# Patient Record
Sex: Male | Born: 1955 | Race: White | Hispanic: No | Marital: Married | Smoking: Former smoker
Health system: Southern US, Community
[De-identification: ages and names within clinical notes are randomized; demographics above are authoritative.]

## PROBLEM LIST (undated history)

## (undated) ENCOUNTER — Encounter

## (undated) ENCOUNTER — Telehealth

## (undated) ENCOUNTER — Ambulatory Visit: Payer: MEDICARE

## (undated) ENCOUNTER — Encounter
Attending: Student in an Organized Health Care Education/Training Program | Primary: Student in an Organized Health Care Education/Training Program

## (undated) ENCOUNTER — Telehealth: Attending: Internal Medicine | Primary: Internal Medicine

## (undated) ENCOUNTER — Emergency Department: Payer: MEDICARE

## (undated) ENCOUNTER — Ambulatory Visit

## (undated) DIAGNOSIS — R202 Paresthesia of skin: Secondary | ICD-10-CM

## (undated) DIAGNOSIS — I10 Essential (primary) hypertension: Secondary | ICD-10-CM

## (undated) DIAGNOSIS — M51369 Other intervertebral disc degeneration, lumbar region without mention of lumbar back pain or lower extremity pain: Secondary | ICD-10-CM

## (undated) DIAGNOSIS — S2239XA Fracture of one rib, unspecified side, initial encounter for closed fracture: Secondary | ICD-10-CM

## (undated) DIAGNOSIS — M1990 Unspecified osteoarthritis, unspecified site: Secondary | ICD-10-CM

## (undated) DIAGNOSIS — K219 Gastro-esophageal reflux disease without esophagitis: Secondary | ICD-10-CM

## (undated) DIAGNOSIS — R200 Anesthesia of skin: Secondary | ICD-10-CM

## (undated) DIAGNOSIS — K5090 Crohn's disease, unspecified, without complications: Secondary | ICD-10-CM

## (undated) DIAGNOSIS — J349 Unspecified disorder of nose and nasal sinuses: Secondary | ICD-10-CM

## (undated) DIAGNOSIS — I639 Cerebral infarction, unspecified: Secondary | ICD-10-CM

## (undated) DIAGNOSIS — E039 Hypothyroidism, unspecified: Secondary | ICD-10-CM

## (undated) DIAGNOSIS — M5136 Other intervertebral disc degeneration, lumbar region: Secondary | ICD-10-CM

## (undated) DIAGNOSIS — S42009A Fracture of unspecified part of unspecified clavicle, initial encounter for closed fracture: Secondary | ICD-10-CM

## (undated) HISTORY — PX: NASAL SINUS SURGERY: SHX719

## (undated) HISTORY — PX: COLONOSCOPY W/ BIOPSIES: SHX1374

## (undated) HISTORY — PX: EYE SURGERY: SHX253

## (undated) HISTORY — DX: Cerebral infarction, unspecified: I63.9

## (undated) MED ORDER — BUDESONIDE DR - ER 3 MG CAPSULE,DELAYED,EXTENDED RELEASE: Freq: Every morning | ORAL | 0 days

## (undated) MED ORDER — OMEGA 3-DHA-EPA-FISH OIL 300 MG-1,000 MG CAPSULE: Freq: Every day | ORAL | 0 days

## (undated) MED ORDER — REMICADE IV: 0.00000 days

## (undated) MED ORDER — ASPIRIN 325 MG TABLET: Freq: Every day | ORAL | 0.00000 days

## (undated) MED ORDER — AMOXICILLIN 875 MG-POTASSIUM CLAVULANATE 125 MG TABLET: Freq: Two times a day (BID) | 0 days

## (undated) MED ORDER — AZITHROMYCIN 250 MG TABLET: 0.00000 days

---

## 2003-07-01 DIAGNOSIS — S2249XA Multiple fractures of ribs, unspecified side, initial encounter for closed fracture: Secondary | ICD-10-CM

## 2003-07-01 HISTORY — DX: Multiple fractures of ribs, unspecified side, initial encounter for closed fracture: S22.49XA

## 2008-06-30 HISTORY — PX: KNEE ARTHROSCOPY: SUR90

## 2014-04-25 ENCOUNTER — Other Ambulatory Visit: Payer: Self-pay | Admitting: Neurosurgery

## 2014-04-28 ENCOUNTER — Encounter (HOSPITAL_COMMUNITY): Payer: Self-pay | Admitting: Pharmacy Technician

## 2014-05-04 NOTE — Pre-Procedure Instructions (Signed)
Liane ComberRobert Golebiewski  05/04/2014   Your procedure is scheduled on:  Friday, November 13th  [facility] Admitting at [phone].  Call this number if you have problems the morning of surgery: [phone]   Remember:   Do not eat food or drink liquids after midnight.   Take these medicines the morning of surgery with A SIP OF WATER: nexium, hydrocodone if needed   Do not wear jewelry.  Do not wear lotions, powders, or perfume, deodorant.  Do not shave 48 hours prior to surgery. Men may shave face and neck.  Do not bring valuables to the hospital.  [facility] is not responsible for any belongings or valuables.               Contacts, dentures or bridgework may not be worn into surgery.  Leave suitcase in the car. After surgery it may be brought to your room.  For patients admitted to the hospital, discharge time is determined by your  treatment team.               Patients discharged the day of surgery will not be allowed to drive home.  Please read over the following fact sheets that you were given: Pain Booklet, Coughing and Deep Breathing, MRSA Information and Surgical Site Infection Prevention  [place] - Preparing for Surgery  Before surgery, you can play an important role.  Because skin is not sterile, your skin needs to be as free of germs as possible.  You can reduce the number of germs on you skin by washing with CHG (chlorahexidine gluconate) soap before surgery.  CHG is an antiseptic cleaner which kills germs and bonds with the skin to continue killing germs even after washing.  Please DO NOT use if you have an allergy to CHG or antibacterial soaps.  If your skin becomes reddened/irritated stop using the CHG and inform your nurse when you arrive at Short Stay.  Do not shave (including legs and underarms) for at least 48 hours prior to the first CHG shower.  You may shave your face.  Please follow these instructions carefully:   1.  Shower with CHG Soap the night  before surgery and the morning of Surgery.  2.  If you choose to wash your hair, wash your hair first as usual with your normal shampoo.  3.  After you shampoo, rinse your hair and body thoroughly to remove the shampoo.  4.  Use CHG as you would any other liquid soap.  You can apply CHG directly to the skin and wash gently with scrungie or a clean washcloth.  5.  Apply the CHG Soap to your body ONLY FROM THE NECK DOWN.  Do not use on open wounds or open sores.  Avoid contact with your eyes, ears, mouth and genitals (private parts).  Wash genitals (private parts) with your normal soap.  6.  Wash thoroughly, paying special attention to the area where your surgery will be performed.  7.  Thoroughly rinse your body with warm water from the neck down.  8.  DO NOT shower/wash with your normal soap after using and rinsing off the CHG Soap.  9.  Pat yourself dry with a clean towel.            10.  Wear clean pajamas.            11.  Place clean sheets on your bed the night of your first shower and do not sleep  with pets.  Day of Surgery  Do not apply any lotions/deoderants the morning of surgery.  Please wear clean clothes to the hospital/surgery center.

## 2014-05-05 ENCOUNTER — Encounter (HOSPITAL_COMMUNITY)
Admission: RE | Admit: 2014-05-05 | Discharge: 2014-05-05 | Disposition: A | Discharge status: Home / Self Care | Payer: BC Managed Care – PPO | Source: Ambulatory Visit | Attending: Neurosurgery | Admitting: Neurosurgery

## 2014-05-05 ENCOUNTER — Ambulatory Visit (HOSPITAL_COMMUNITY)
Admission: RE | Admit: 2014-05-05 | Discharge: 2014-05-05 | Disposition: A | Discharge status: Home / Self Care | Payer: BC Managed Care – PPO | Source: Ambulatory Visit | Attending: Anesthesiology | Admitting: Anesthesiology

## 2014-05-05 ENCOUNTER — Encounter (HOSPITAL_COMMUNITY): Payer: Self-pay

## 2014-05-05 DIAGNOSIS — Z01811 Encounter for preprocedural respiratory examination: Secondary | ICD-10-CM | POA: Diagnosis not present

## 2014-05-05 DIAGNOSIS — R0602 Shortness of breath: Secondary | ICD-10-CM | POA: Diagnosis present

## 2014-05-05 HISTORY — DX: Unspecified disorder of nose and nasal sinuses: J34.9

## 2014-05-05 HISTORY — DX: Essential (primary) hypertension: I10

## 2014-05-05 HISTORY — DX: Other intervertebral disc degeneration, lumbar region without mention of lumbar back pain or lower extremity pain: M51.369

## 2014-05-05 HISTORY — DX: Other intervertebral disc degeneration, lumbar region: M51.36

## 2014-05-05 HISTORY — DX: Gastro-esophageal reflux disease without esophagitis: K21.9

## 2014-05-05 HISTORY — DX: Crohn's disease, unspecified, without complications: K50.90

## 2014-05-05 HISTORY — DX: Fracture of one rib, unspecified side, initial encounter for closed fracture: S22.39XA

## 2014-05-05 HISTORY — DX: Fracture of unspecified part of unspecified clavicle, initial encounter for closed fracture: S42.009A

## 2014-05-05 HISTORY — DX: Hypothyroidism, unspecified: E03.9

## 2014-05-05 HISTORY — DX: Anesthesia of skin: R20.0

## 2014-05-05 HISTORY — DX: Paresthesia of skin: R20.2

## 2014-05-05 HISTORY — DX: Unspecified osteoarthritis, unspecified site: M19.90

## 2014-05-05 LAB — BASIC METABOLIC PANEL
Anion gap: 16 — ABNORMAL HIGH (ref 5–15)
BUN: 14 mg/dL (ref 6–23)
CHLORIDE: 104 meq/L (ref 96–112)
CO2: 22 meq/L (ref 19–32)
CREATININE: 0.73 mg/dL (ref 0.50–1.35)
Calcium: 9.4 mg/dL (ref 8.4–10.5)
GFR calc Af Amer: >90 mL/min (ref >90)
GFR calc non Af Amer: >90 mL/min (ref >90)
Glucose, Bld: 104 mg/dL — ABNORMAL HIGH (ref 70–99)
POTASSIUM: 3.5 meq/L — AB (ref 3.7–5.3)
Sodium: 142 mEq/L (ref 137–147)

## 2014-05-05 LAB — CBC
HEMATOCRIT: 40 % (ref 39.0–52.0)
HEMOGLOBIN: 13.6 g/dL (ref 13.0–17.0)
MCH: 34.4 pg — AB (ref 26.0–34.0)
MCHC: 34 g/dL (ref 30.0–36.0)
MCV: 101.3 fL — ABNORMAL HIGH (ref 78.0–100.0)
Platelets: 288 10*3/uL (ref 150–400)
RBC: 3.95 MIL/uL — AB (ref 4.22–5.81)
RDW: 13.8 % (ref 11.5–15.5)
WBC: 10.4 10*3/uL (ref 4.0–10.5)

## 2014-05-05 LAB — SURGICAL PCR SCREEN
MRSA, PCR: NEGATIVE
Staphylococcus aureus: POSITIVE — AB

## 2014-05-05 NOTE — Progress Notes (Signed)
Mr Sean Poole has had a cough this past week .  Patient saw his PCP, Dr Jonelle SportsPomposini and was started on antibiotics. Dr Jonelle SportsPomposini did not do order a chest xray.  Chest Xray done today.

## 2014-05-05 NOTE — Progress Notes (Signed)
   05/05/14 1041  OBSTRUCTIVE SLEEP APNEA  Have you ever been diagnosed with sleep apnea through a sleep study? No  Do you snore loudly (loud enough to be heard through closed doors)?  1  Do you often feel tired, fatigued, or sleepy during the daytime? 0  Has anyone observed you stop breathing during your sleep? 0  Do you have, or are you being treated for high blood pressure? 1  BMI more than 35 kg/m2? 0  Age over 10345 years old? 1  Neck circumference greater than 40 cm/16 inches? 1 (17)  Gender: 1  Obstructive Sleep Apnea Score 5  Score 4 or greater  Results sent to PCP

## 2014-05-05 NOTE — Progress Notes (Signed)
I called a prescription to [facility] for Mupirocin Ointment, 22 gram tube, apply to each nosteril twice a day times 5 days.

## 2014-05-11 MED ORDER — CEFAZOLIN SODIUM-DEXTROSE 2-3 GM-% IV SOLR
2.0000 g | INTRAVENOUS | Status: AC
  Administered 2014-05-12: 2 g via INTRAVENOUS
  Filled 2014-05-11: qty 50

## 2014-05-12 ENCOUNTER — Encounter (HOSPITAL_COMMUNITY)
Admission: RE | Disposition: A | Discharge status: Home / Self Care | Payer: Self-pay | Source: Ambulatory Visit | Attending: Neurosurgery

## 2014-05-12 ENCOUNTER — Ambulatory Visit (HOSPITAL_COMMUNITY): Payer: BC Managed Care – PPO | Admitting: Anesthesiology

## 2014-05-12 ENCOUNTER — Observation Stay (HOSPITAL_COMMUNITY)
Admission: RE | Admit: 2014-05-12 | Discharge: 2014-05-13 | Disposition: A | Discharge status: Home / Self Care | Payer: BC Managed Care – PPO | Source: Ambulatory Visit | Attending: Neurosurgery | Admitting: Neurosurgery

## 2014-05-12 ENCOUNTER — Encounter (HOSPITAL_COMMUNITY): Payer: Self-pay | Admitting: *Deleted

## 2014-05-12 ENCOUNTER — Ambulatory Visit (HOSPITAL_COMMUNITY): Payer: BC Managed Care – PPO

## 2014-05-12 DIAGNOSIS — Z79899 Other long term (current) drug therapy: Secondary | ICD-10-CM | POA: Diagnosis not present

## 2014-05-12 DIAGNOSIS — M5020 Other cervical disc displacement, unspecified cervical region: Secondary | ICD-10-CM | POA: Diagnosis present

## 2014-05-12 DIAGNOSIS — M5012 Cervical disc disorder with radiculopathy, mid-cervical region: Principal | ICD-10-CM | POA: Insufficient documentation

## 2014-05-12 HISTORY — PX: ANTERIOR CERVICAL DECOMP/DISCECTOMY FUSION: SHX1161

## 2014-05-12 SURGERY — ANTERIOR CERVICAL DECOMPRESSION/DISCECTOMY FUSION 1 LEVEL
Anesthesia: General

## 2014-05-12 MED ORDER — MORPHINE SULFATE 2 MG/ML IJ SOLN
1.0000 mg | INTRAMUSCULAR | Status: DC | PRN
  Administered 2014-05-12: 4 mg via INTRAVENOUS
  Filled 2014-05-12: qty 2

## 2014-05-12 MED ORDER — GLYCOPYRROLATE 0.2 MG/ML IJ SOLN
INTRAMUSCULAR | Status: DC | PRN
  Administered 2014-05-12: 0.6 mg via INTRAVENOUS

## 2014-05-12 MED ORDER — CEFUROXIME AXETIL 500 MG PO TABS: 500.0000 mg | ORAL_TABLET | Freq: Two times a day (BID) | ORAL | Status: DC

## 2014-05-12 MED ORDER — IRBESARTAN 75 MG PO TABS
75.0000 mg | ORAL_TABLET | Freq: Every day | ORAL | Status: DC
  Filled 2014-05-12 (×2): qty 1

## 2014-05-12 MED ORDER — NEOSTIGMINE METHYLSULFATE 10 MG/10ML IV SOLN
INTRAVENOUS | Status: DC | PRN
  Administered 2014-05-12: 5 mg via INTRAVENOUS

## 2014-05-12 MED ORDER — OXYCODONE-ACETAMINOPHEN 5-325 MG PO TABS
ORAL_TABLET | ORAL | Status: AC
  Administered 2014-05-13: 2
  Filled 2014-05-12: qty 2

## 2014-05-12 MED ORDER — MIDAZOLAM HCL 5 MG/5ML IJ SOLN
INTRAMUSCULAR | Status: DC | PRN
  Administered 2014-05-12: 2 mg via INTRAVENOUS

## 2014-05-12 MED ORDER — DIAZEPAM 5 MG PO TABS
ORAL_TABLET | ORAL | Status: AC
Start: 2014-05-12 — End: 2014-05-13
  Filled 2014-05-12: qty 1

## 2014-05-12 MED ORDER — SODIUM CHLORIDE 0.9 % IJ SOLN: 3.0000 mL | INTRAMUSCULAR | Status: DC | PRN

## 2014-05-12 MED ORDER — GLYCOPYRROLATE 0.2 MG/ML IJ SOLN
INTRAMUSCULAR | Status: AC
  Filled 2014-05-12: qty 3

## 2014-05-12 MED ORDER — DOCUSATE SODIUM 100 MG PO CAPS
100.0000 mg | ORAL_CAPSULE | Freq: Two times a day (BID) | ORAL | Status: DC
  Administered 2014-05-12: 100 mg via ORAL
  Filled 2014-05-12 (×3): qty 1

## 2014-05-12 MED ORDER — PHENOL 1.4 % MT LIQD: 1.0000 | OROMUCOSAL | Status: DC | PRN

## 2014-05-12 MED ORDER — ACETAMINOPHEN 650 MG RE SUPP
650.0000 mg | RECTAL | Status: DC | PRN
Start: 2014-05-12 — End: 2014-05-13

## 2014-05-12 MED ORDER — POLYETHYLENE GLYCOL 3350 17 G PO PACK
17.0000 g | PACK | Freq: Every day | ORAL | Status: DC | PRN
  Filled 2014-05-12: qty 1

## 2014-05-12 MED ORDER — LIDOCAINE HCL (CARDIAC) 20 MG/ML IV SOLN
INTRAVENOUS | Status: DC | PRN
  Administered 2014-05-12: 100 mg via INTRAVENOUS

## 2014-05-12 MED ORDER — THROMBIN 5000 UNITS EX SOLR
CUTANEOUS | Status: DC | PRN
  Administered 2014-05-12 (×2): 5000 [IU] via TOPICAL

## 2014-05-12 MED ORDER — NEOSTIGMINE METHYLSULFATE 10 MG/10ML IV SOLN
INTRAVENOUS | Status: AC
  Filled 2014-05-12: qty 1

## 2014-05-12 MED ORDER — SENNA 8.6 MG PO TABS
1.0000 | ORAL_TABLET | Freq: Two times a day (BID) | ORAL | Status: DC
  Administered 2014-05-12: 8.6 mg via ORAL
  Filled 2014-05-12 (×3): qty 1

## 2014-05-12 MED ORDER — PROPOFOL 10 MG/ML IV BOLUS
INTRAVENOUS | Status: AC
  Filled 2014-05-12: qty 20

## 2014-05-12 MED ORDER — HEMOSTATIC AGENTS (NO CHARGE) OPTIME
TOPICAL | Status: DC | PRN
  Administered 2014-05-12: 1 via TOPICAL

## 2014-05-12 MED ORDER — NIACIN ER (ANTIHYPERLIPIDEMIC) 1000 MG PO TBCR
1000.0000 mg | EXTENDED_RELEASE_TABLET | Freq: Every day | ORAL | Status: DC
  Filled 2014-05-12 (×2): qty 1

## 2014-05-12 MED ORDER — ONDANSETRON HCL 4 MG/2ML IJ SOLN: 4.0000 mg | INTRAMUSCULAR | Status: DC | PRN

## 2014-05-12 MED ORDER — PROPOFOL 10 MG/ML IV BOLUS
INTRAVENOUS | Status: DC | PRN
Start: 2014-05-12 — End: 2014-05-12
  Administered 2014-05-12: 180 mg via INTRAVENOUS

## 2014-05-12 MED ORDER — MENTHOL 3 MG MT LOZG
1.0000 | LOZENGE | OROMUCOSAL | Status: DC | PRN
  Filled 2014-05-12: qty 9

## 2014-05-12 MED ORDER — ROCURONIUM BROMIDE 50 MG/5ML IV SOLN
INTRAVENOUS | Status: AC
  Filled 2014-05-12: qty 1

## 2014-05-12 MED ORDER — NIACIN ER 500 MG PO CPCR
1000.0000 mg | ORAL_CAPSULE | Freq: Every day | ORAL | Status: DC
  Filled 2014-05-12: qty 2

## 2014-05-12 MED ORDER — 0.9 % SODIUM CHLORIDE (POUR BTL) OPTIME
TOPICAL | Status: DC | PRN
  Administered 2014-05-12: 1000 mL

## 2014-05-12 MED ORDER — BUDESONIDE 3 MG PO CP24
6.0000 mg | ORAL_CAPSULE | Freq: Every day | ORAL | Status: DC
  Filled 2014-05-12 (×2): qty 2

## 2014-05-12 MED ORDER — OXYCODONE HCL 5 MG/5ML PO SOLN: 5.0000 mg | Freq: Once | ORAL | Status: DC | PRN

## 2014-05-12 MED ORDER — NEOSTIGMINE METHYLSULFATE 10 MG/10ML IV SOLN
INTRAVENOUS | Status: AC
Start: 2014-05-12 — End: 2014-05-12
  Filled 2014-05-12: qty 1

## 2014-05-12 MED ORDER — OXYCODONE HCL 5 MG PO TABS: 5.0000 mg | ORAL_TABLET | Freq: Once | ORAL | Status: DC | PRN

## 2014-05-12 MED ORDER — NIACIN ER (ANTIHYPERLIPIDEMIC) 1000 MG PO TBCR: 1000.0000 mg | EXTENDED_RELEASE_TABLET | Freq: Every day | ORAL | Status: DC

## 2014-05-12 MED ORDER — ONDANSETRON HCL 4 MG/2ML IJ SOLN
INTRAMUSCULAR | Status: AC
  Filled 2014-05-12: qty 2

## 2014-05-12 MED ORDER — IRBESARTAN 75 MG PO TABS: 75.0000 mg | ORAL_TABLET | Freq: Every day | ORAL | Status: DC

## 2014-05-12 MED ORDER — FENTANYL CITRATE 0.05 MG/ML IJ SOLN
INTRAMUSCULAR | Status: AC
Start: 2014-05-12 — End: 2014-05-12
  Filled 2014-05-12: qty 5

## 2014-05-12 MED ORDER — FOLIC ACID 1 MG PO TABS
1.0000 mg | ORAL_TABLET | Freq: Every day | ORAL | Status: DC
  Filled 2014-05-12 (×2): qty 1

## 2014-05-12 MED ORDER — ONDANSETRON HCL 4 MG/2ML IJ SOLN: 4.0000 mg | Freq: Once | INTRAMUSCULAR | Status: DC | PRN

## 2014-05-12 MED ORDER — HYDROCORTISONE NA SUCCINATE PF 100 MG IJ SOLR
INTRAMUSCULAR | Status: DC | PRN
  Administered 2014-05-12: 100 mg via INTRAVENOUS

## 2014-05-12 MED ORDER — ZOLPIDEM TARTRATE 5 MG PO TABS
5.0000 mg | ORAL_TABLET | Freq: Every evening | ORAL | Status: DC | PRN
  Administered 2014-05-12: 5 mg via ORAL
  Filled 2014-05-12: qty 1

## 2014-05-12 MED ORDER — OXYCODONE-ACETAMINOPHEN 5-325 MG PO TABS
1.0000 | ORAL_TABLET | ORAL | Status: DC | PRN
  Administered 2014-05-12 – 2014-05-13 (×3): 2 via ORAL
  Filled 2014-05-12 (×3): qty 2

## 2014-05-12 MED ORDER — SODIUM CHLORIDE 0.9 % IJ SOLN
3.0000 mL | Freq: Two times a day (BID) | INTRAMUSCULAR | Status: DC
  Administered 2014-05-12: 3 mL via INTRAVENOUS

## 2014-05-12 MED ORDER — ALUM & MAG HYDROXIDE-SIMETH 200-200-20 MG/5ML PO SUSP
30.0000 mL | Freq: Four times a day (QID) | ORAL | Status: DC | PRN
  Filled 2014-05-12: qty 30

## 2014-05-12 MED ORDER — SIMVASTATIN 20 MG PO TABS
20.0000 mg | ORAL_TABLET | Freq: Every day | ORAL | Status: DC
  Administered 2014-05-12: 20 mg via ORAL
  Filled 2014-05-12 (×2): qty 1

## 2014-05-12 MED ORDER — BUPIVACAINE HCL (PF) 0.5 % IJ SOLN
INTRAMUSCULAR | Status: DC | PRN
  Administered 2014-05-12 (×2): 4 mL

## 2014-05-12 MED ORDER — PANTOPRAZOLE SODIUM 40 MG IV SOLR
40.0000 mg | Freq: Every day | INTRAVENOUS | Status: DC
  Filled 2014-05-12: qty 40

## 2014-05-12 MED ORDER — HYDROCODONE-ACETAMINOPHEN 7.5-325 MG PO TABS: 1.0000 | ORAL_TABLET | Freq: Four times a day (QID) | ORAL | Status: DC | PRN

## 2014-05-12 MED ORDER — ROCURONIUM BROMIDE 100 MG/10ML IV SOLN
INTRAVENOUS | Status: DC | PRN
  Administered 2014-05-12: 50 mg via INTRAVENOUS

## 2014-05-12 MED ORDER — FENTANYL CITRATE 0.05 MG/ML IJ SOLN
INTRAMUSCULAR | Status: DC | PRN
  Administered 2014-05-12 (×2): 100 ug via INTRAVENOUS
  Administered 2014-05-12 (×2): 50 ug via INTRAVENOUS

## 2014-05-12 MED ORDER — BISACODYL 10 MG RE SUPP: 10.0000 mg | Freq: Every day | RECTAL | Status: DC | PRN

## 2014-05-12 MED ORDER — CYCLOBENZAPRINE HCL 10 MG PO TABS
ORAL_TABLET | ORAL | Status: AC
  Filled 2014-05-12: qty 1

## 2014-05-12 MED ORDER — MIDAZOLAM HCL 2 MG/2ML IJ SOLN
INTRAMUSCULAR | Status: AC
  Filled 2014-05-12: qty 2

## 2014-05-12 MED ORDER — VECURONIUM BROMIDE 10 MG IV SOLR
INTRAVENOUS | Status: DC | PRN
  Administered 2014-05-12: 2 mg via INTRAVENOUS

## 2014-05-12 MED ORDER — PHENYLEPHRINE HCL 10 MG/ML IJ SOLN
10.0000 mg | INTRAVENOUS | Status: DC | PRN
  Administered 2014-05-12: 25 ug/min via INTRAVENOUS

## 2014-05-12 MED ORDER — FENTANYL CITRATE 0.05 MG/ML IJ SOLN
INTRAMUSCULAR | Status: AC
  Filled 2014-05-12: qty 5

## 2014-05-12 MED ORDER — METHOTREXATE 2.5 MG PO TABS: 25.0000 mg | ORAL_TABLET | ORAL | Status: DC

## 2014-05-12 MED ORDER — DIAZEPAM 5 MG PO TABS
5.0000 mg | ORAL_TABLET | Freq: Four times a day (QID) | ORAL | Status: DC | PRN
  Administered 2014-05-12 – 2014-05-13 (×2): 5 mg via ORAL
  Filled 2014-05-12: qty 1

## 2014-05-12 MED ORDER — NIACIN-SIMVASTATIN ER 1000-20 MG PO TB24: 1.0000 | ORAL_TABLET | Freq: Every day | ORAL | Status: DC

## 2014-05-12 MED ORDER — FLEET ENEMA 7-19 GM/118ML RE ENEM
1.0000 | ENEMA | Freq: Once | RECTAL | Status: AC | PRN
  Filled 2014-05-12: qty 1

## 2014-05-12 MED ORDER — CEFAZOLIN SODIUM 1-5 GM-% IV SOLN
1.0000 g | Freq: Three times a day (TID) | INTRAVENOUS | Status: AC
  Administered 2014-05-12 – 2014-05-13 (×2): 1 g via INTRAVENOUS
  Filled 2014-05-12 (×2): qty 50

## 2014-05-12 MED ORDER — OXYCODONE HCL 5 MG PO TABS
ORAL_TABLET | ORAL | Status: AC
  Administered 2014-05-12: 5 mg
  Filled 2014-05-12: qty 1

## 2014-05-12 MED ORDER — LACTATED RINGERS IV SOLN
INTRAVENOUS | Status: DC | PRN
Start: 2014-05-12 — End: 2014-05-12
  Administered 2014-05-12 (×2): via INTRAVENOUS

## 2014-05-12 MED ORDER — ONDANSETRON HCL 4 MG/2ML IJ SOLN
INTRAMUSCULAR | Status: DC | PRN
Start: 2014-05-12 — End: 2014-05-12
  Administered 2014-05-12: 4 mg via INTRAVENOUS

## 2014-05-12 MED ORDER — CYCLOBENZAPRINE HCL 10 MG PO TABS
10.0000 mg | ORAL_TABLET | Freq: Three times a day (TID) | ORAL | Status: DC | PRN
  Administered 2014-05-12: 10 mg via ORAL

## 2014-05-12 MED ORDER — LIDOCAINE HCL (CARDIAC) 20 MG/ML IV SOLN
INTRAVENOUS | Status: AC
Start: 2014-05-12 — End: 2014-05-12
  Filled 2014-05-12: qty 5

## 2014-05-12 MED ORDER — HYDROMORPHONE HCL 1 MG/ML IJ SOLN
0.2500 mg | INTRAMUSCULAR | Status: DC | PRN
  Administered 2014-05-12 (×4): 0.5 mg via INTRAVENOUS

## 2014-05-12 MED ORDER — HYDROMORPHONE HCL 1 MG/ML IJ SOLN
INTRAMUSCULAR | Status: AC
  Filled 2014-05-12: qty 1

## 2014-05-12 MED ORDER — KCL IN DEXTROSE-NACL 20-5-0.45 MEQ/L-%-% IV SOLN
INTRAVENOUS | Status: DC
  Filled 2014-05-12 (×2): qty 1000

## 2014-05-12 MED ORDER — PANTOPRAZOLE SODIUM 40 MG PO TBEC: 40.0000 mg | DELAYED_RELEASE_TABLET | Freq: Every day | ORAL | Status: DC

## 2014-05-12 MED ORDER — SODIUM CHLORIDE 0.9 % IV SOLN: 250.0000 mL | INTRAVENOUS | Status: DC

## 2014-05-12 MED ORDER — LACTATED RINGERS IV SOLN
INTRAVENOUS | Status: DC
  Administered 2014-05-12: 12:00:00 via INTRAVENOUS

## 2014-05-12 MED ORDER — HYDROCODONE-ACETAMINOPHEN 5-325 MG PO TABS: 1.0000 | ORAL_TABLET | ORAL | Status: DC | PRN

## 2014-05-12 MED ORDER — ACETAMINOPHEN 325 MG PO TABS: 650.0000 mg | ORAL_TABLET | ORAL | Status: DC | PRN

## 2014-05-12 SURGICAL SUPPLY — 69 items
ALLOGRAFT LORDOTIC 8X11X14 (Bone Implant) ×3 IMPLANT
BENZOIN TINCTURE PRP APPL 2/3 (GAUZE/BANDAGES/DRESSINGS) IMPLANT
BIT DRILL NEURO 2X3.1 SFT TUCH (MISCELLANEOUS) ×1 IMPLANT
BLADE ULTRA TIP 2M (BLADE) IMPLANT
BNDG GAUZE ELAST 4 BULKY (GAUZE/BANDAGES/DRESSINGS) IMPLANT
BUR BARREL STRAIGHT FLUTE 4.0 (BURR) ×3 IMPLANT
CANISTER SUCT 3000ML (MISCELLANEOUS) ×3 IMPLANT
CLOSURE WOUND 1/2 X4 (GAUZE/BANDAGES/DRESSINGS)
CONT SPEC 4OZ CLIKSEAL STRL BL (MISCELLANEOUS) ×3 IMPLANT
COVER MAYO STAND STRL (DRAPES) ×3 IMPLANT
DECANTER SPIKE VIAL GLASS SM (MISCELLANEOUS) ×3 IMPLANT
DERMABOND ADHESIVE PROPEN (GAUZE/BANDAGES/DRESSINGS) ×2
DERMABOND ADVANCED .7 DNX6 (GAUZE/BANDAGES/DRESSINGS) ×1 IMPLANT
DRAPE LAPAROTOMY 100X72 PEDS (DRAPES) ×3 IMPLANT
DRAPE MICROSCOPE LEICA (MISCELLANEOUS) ×3 IMPLANT
DRAPE POUCH INSTRU U-SHP 10X18 (DRAPES) ×3 IMPLANT
DRAPE PROXIMA HALF (DRAPES) IMPLANT
DRILL NEURO 2X3.1 SOFT TOUCH (MISCELLANEOUS) ×3
DRSG TELFA 3X8 NADH (GAUZE/BANDAGES/DRESSINGS) IMPLANT
DURAPREP 6ML APPLICATOR 50/CS (WOUND CARE) ×3 IMPLANT
ELECT COATED BLADE 2.86 ST (ELECTRODE) ×3 IMPLANT
ELECT REM PT RETURN 9FT ADLT (ELECTROSURGICAL) ×3
ELECTRODE REM PT RTRN 9FT ADLT (ELECTROSURGICAL) ×1 IMPLANT
GAUZE SPONGE 4X4 12PLY STRL (GAUZE/BANDAGES/DRESSINGS) IMPLANT
GAUZE SPONGE 4X4 16PLY XRAY LF (GAUZE/BANDAGES/DRESSINGS) IMPLANT
GLOVE BIO SURGEON STRL SZ8 (GLOVE) ×3 IMPLANT
GLOVE BIOGEL PI IND STRL 7.0 (GLOVE) ×1 IMPLANT
GLOVE BIOGEL PI IND STRL 8 (GLOVE) ×2 IMPLANT
GLOVE BIOGEL PI IND STRL 8.5 (GLOVE) ×1 IMPLANT
GLOVE BIOGEL PI INDICATOR 7.0 (GLOVE) ×2
GLOVE BIOGEL PI INDICATOR 8 (GLOVE) ×4
GLOVE BIOGEL PI INDICATOR 8.5 (GLOVE) ×2
GLOVE ECLIPSE 6.5 STRL STRAW (GLOVE) ×3 IMPLANT
GLOVE ECLIPSE 8.0 STRL XLNG CF (GLOVE) ×6 IMPLANT
GLOVE EXAM NITRILE LRG STRL (GLOVE) IMPLANT
GLOVE EXAM NITRILE MD LF STRL (GLOVE) IMPLANT
GLOVE EXAM NITRILE XL STR (GLOVE) IMPLANT
GLOVE EXAM NITRILE XS STR PU (GLOVE) IMPLANT
GLOVE SURG SS PI 7.0 STRL IVOR (GLOVE) ×6 IMPLANT
GOWN STRL REUS W/ TWL LRG LVL3 (GOWN DISPOSABLE) ×1 IMPLANT
GOWN STRL REUS W/ TWL XL LVL3 (GOWN DISPOSABLE) ×3 IMPLANT
GOWN STRL REUS W/TWL 2XL LVL3 (GOWN DISPOSABLE) ×6 IMPLANT
GOWN STRL REUS W/TWL LRG LVL3 (GOWN DISPOSABLE) ×2
GOWN STRL REUS W/TWL XL LVL3 (GOWN DISPOSABLE) ×6
HALTER HD/CHIN CERV TRACTION D (MISCELLANEOUS) ×3 IMPLANT
KIT BASIN OR (CUSTOM PROCEDURE TRAY) ×3 IMPLANT
KIT ROOM TURNOVER OR (KITS) ×3 IMPLANT
LIQUID BAND (GAUZE/BANDAGES/DRESSINGS) ×3 IMPLANT
NEEDLE HYPO 18GX1.5 BLUNT FILL (NEEDLE) IMPLANT
NEEDLE HYPO 25X1 1.5 SAFETY (NEEDLE) ×3 IMPLANT
NEEDLE SPNL 22GX3.5 QUINCKE BK (NEEDLE) ×3 IMPLANT
NS IRRIG 1000ML POUR BTL (IV SOLUTION) ×3 IMPLANT
PACK LAMINECTOMY NEURO (CUSTOM PROCEDURE TRAY) ×3 IMPLANT
PAD ARMBOARD 7.5X6 YLW CONV (MISCELLANEOUS) ×9 IMPLANT
PIN DISTRACTION 14MM (PIN) ×6 IMPLANT
PLATE ARCHON 24MM 1LVL (Plate) ×3 IMPLANT
RUBBERBAND STERILE (MISCELLANEOUS) ×6 IMPLANT
SCREW ARCHON SELFTAP 4.0X13 (Screw) ×12 IMPLANT
SPONGE INTESTINAL PEANUT (DISPOSABLE) ×3 IMPLANT
SPONGE SURGIFOAM ABS GEL SZ50 (HEMOSTASIS) ×3 IMPLANT
STAPLER SKIN PROX WIDE 3.9 (STAPLE) IMPLANT
STRIP CLOSURE SKIN 1/2X4 (GAUZE/BANDAGES/DRESSINGS) IMPLANT
SUT VIC AB 3-0 SH 8-18 (SUTURE) ×6 IMPLANT
SYR 20ML ECCENTRIC (SYRINGE) ×3 IMPLANT
SYR 3ML LL SCALE MARK (SYRINGE) IMPLANT
TOWEL OR 17X24 6PK STRL BLUE (TOWEL DISPOSABLE) ×3 IMPLANT
TOWEL OR 17X26 10 PK STRL BLUE (TOWEL DISPOSABLE) ×3 IMPLANT
TRAP SPECIMEN MUCOUS 40CC (MISCELLANEOUS) ×3 IMPLANT
WATER STERILE IRR 1000ML POUR (IV SOLUTION) ×3 IMPLANT

## 2014-05-12 NOTE — Op Note (Signed)
05/12/2014  4:09 PM  PATIENT:  Sean Poole  58 y.o. 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 Anterior cervical decompression/diskectomy/fusion (N/A) with allograft, autograft, plate  SURGEON:  Surgeon(s) and Role:    * Maeola HarmanJoseph Natina Wiginton, MD - Primary  PHYSICIAN ASSISTANT:   ASSISTANTS: Poteat, RN   ANESTHESIA:   general  EBL:  Total I/O In: 1300 [I.V.:1300] Out: -   BLOOD ADMINISTERED:none  DRAINS: none   LOCAL MEDICATIONS USED:  LIDOCAINE   SPECIMEN:  No Specimen  DISPOSITION OF SPECIMEN:  N/A  COUNTS:  YES  TOURNIQUET:  * No tourniquets in log *  DICTATION: DICTATION: Patient is 58 year old male with herniation of disc C 45 with right C 5 radiculopathy and a severely spondylotic facet joint at C 45 on the the right with stenosis, cervicalgia.  He did not get relief with right C 45 facet injections.  It was elected to take him to surgery for anterior cervical decompression and fusion C 45 level.  PROCEDURE: Patient was brought to operating room and following the smooth and uncomplicated induction of general endotracheal anesthesia his head was placed on a horseshoe head holder he was placed in 5 pounds of Holter traction and his anterior neck was prepped and draped in usual sterile fashion. An incision was made on the left side of midline after infiltrating the skin and subcutaneous tissues with local lidocaine. The platysmal layer was incised and subplatysmal dissection was performed exposing the anterior border sternocleidomastoid muscle. Using blunt dissection the carotid sheath was kept lateral and trachea and esophagus kept medial exposing  the anterior cervical spine. A bent spinal needle was placed it was felt to be the C 34 level and this was confirmed on intraoperative x-ray. Longus coli muscles were taken down from the anterior cervical spine using electrocautery and key elevator and self-retaining retractor was placed exposing the C 45 level. The interspace was incised and a thorough discectomy was performed. Distraction pins were placed. Uncinate spurs and central spondylitic ridges were drilled down with a high-speed drill. The spinal cord dura and both C5 nerve roots were widely decompressed, particularly on the right.  At this level, the nerve root was widely decompressed. Hemostasis was assured. After trial sizing an 8 mm lordotic allograft bone wedge was selected and packed with local autograft. This was tamped into position and countersunk appropriately. Distraction weight was removed. A 24 mm Nuvasive Archon anterior cervical plate was affixed to the cervical spine with 13 mm variable-angle screws 2 at C4, 2 at C5. All screws were well-positioned and locking mechanisms were engaged. A final X ray was obtained which showed well positioned graft and anterior plate without complicating features. Soft tissues were inspected and found to be in good repair. The wound was irrigated. The platysma layer was closed with 3-0 Vicryl stitches and the skin was reapproximated with 3-0 Vicryl subcuticular stitches. The wound was dressed with Dermabond. Counts were correct at the end of the case. Patient was extubated and taken to recovery in stable and satisfactory condition.   PLAN OF CARE: Admit for overnight observation  PATIENT DISPOSITION:  PACU - hemodynamically stable.   Delay start of Pharmacological VTE agent (>24hrs) due to surgical blood loss or risk of bleeding: yes

## 2014-05-12 NOTE — H&P (Signed)
> [address] [place], [place] [id number] Phone: [phone]   Patient ID:   [id number] Patient: Sean ComberRobert Poole  Date of Birth: 1955-12-25 Visit Type: Office Visit   Date: 03/22/2014 12:00 PM Provider: Danae OrleansJoseph D. Venetia MaxonStern MD   This 58 year old male presents for Follow Up of back pain and Follow Up of neck pain.  History of Present Illness: 1.  Follow Up of back pain  2.  Follow Up of neck pain  Patient reports severe right-sided neck pain and pain into his right upper arm.  He notes numbness in the C5 distribution.  I do not detect significant weakness on examination.  He has a positive Spurling maneuver to the right.  I reviewed his cervical MRI which was done in [place] and which shows severe facet arthropathy at C4 C5 on the right with marked foraminal stenosis at C3 C4 and C4 C5 on the right but also with significant foraminal stenosis at C5 C6 and C6 C7 levels.  Given that the patient does not have significant weakness but as intense pain and numbness I have recommended that he undergo a right C4 C5 facet joint injection and see how he does with that.  I'm reluctant to recommend surgery given the extensive nature of his degenerative changes in the face of no significant weakness but do think he continues to have significant pain and is disabled as a result of that.  The patient will undergo facet block with Dr. Myrle ShengFraifeld and we will see how he does with that.  He will follow up with me if he is not improving and needs to consider surgical intervention.  At this point I believe that the patient is disabled and should remain out of work.  Dr. Myrle ShengFraifeld will be managing pain medications and also disability status.      Medical/Surgical/Interim History Reviewed, no change.  Last detailed document date:02/13/2014.   PAST MEDICAL HISTORY, SURGICAL HISTORY, FAMILY HISTORY, SOCIAL HISTORY AND REVIEW OF SYSTEMS I have reviewed the patient's past medical, surgical, family  and social history as well as the comprehensive review of systems as included on the [facility] history form dated 02/13/2014, which I have signed.  Family History: Reviewed, no changes.  Last detailed document: 02/13/2014.   Social History: Tobacco use reviewed. Reviewed, no changes. Last detailed document date: 02/13/2014.      MEDICATIONS(added, continued or stopped this visit):   Started Medication Directions Instruction Stopped   Benicar 20 mg tablet take 1 tablet by oral route  every day     budesonide DR & ER 3 mg capsule,delayed,extended release take 2 capsule by oral route  every day in the morning for up to 8 weeks     folic acid take 1 tablet by oral route  every day     Lortab 5 mg-325 mg tablet take 1 tablet by oral route  every 6 hours as needed for pain     methotrexate sodium Take 10 tablets a week     Nexium 40 mg capsule,delayed release take 1 capsule by oral route  every day     Simcor take 1 tablet by oral route  every day at bedtime after a low-fat snack      ALLERGIES:  Ingredient Reaction Medication Name Comment  NO KNOWN ALLERGIES     No known allergies. Reviewed, no changes.   Vitals Date Temp F BP Pulse Ht In Wt Lb BMI BSA Pain Score  03/22/2014  124/77 76 70 203 29.13  7/10        IMPRESSION Severe facet arthropathy C4 C5 right with multilevel spondylosis and foraminal stenosis.  Completed Orders (this encounter) Order Details Reason Side Interpretation Result Initial Treatment Date Region  Lifestyle education regarding diet Encouraged to eat a well balanced diet and follow up with primary care physician.         Assessment/Plan # Detail Type Description   1. Assessment Cervical disc disorder w/ radiculopathy (723.4).   Plan Orders Facet Joint Injection - right - C4-C5 Fraifeld.       2. Assessment Cervical disk displacement w/o myelopathy (722.0).       3. Assessment Cervical spondylosis w/o myelopathy  (721.0).       4. Assessment Neck pain (723.1).       5. Assessment BMI 29.0-29.9,ADULT (V85.25).   Plan Orders Today's instructions / counseling include(s) Lifestyle education regarding diet.         Pain Assessment/Treatment Pain Scale: 7/10. Method: Numeric Pain Intensity Scale. Location: back. Onset: 02/13/2009. Duration: varies. Quality: discomforting. Pain Assessment/Treatment follow-up plan of care: Patient is currently taking medication for pain as prescribed..  I have recommended patient remain out of work and go ahead with a right C4 C5 facet block which will be performed Dr. Myrle ShengFraifeld, who will manage his continued disability status and pain management.  The patient will follow up with me is not any better after injection.  Orders: Office Procedures/Services: Assessment Service Comments  723.4 Facet Joint Injection - right - C4-C5 Fraifeld    Instruction(s)/Education: Assessment Instruction  V85.25 Lifestyle education regarding diet             Provider:  Danae OrleansJoseph D. Venetia MaxonStern MD  03/22/2014 01:27 PM Dictation edited by: Danae OrleansJoseph D. Venetia MaxonStern    CC Providers: Rhea PinkEduardo Fraifeld [address] [place], [place] [id number] ----------------------------------------------------------------------------------------------------------------------------------------------------------------------         Electronically signed by Danae OrleansJoseph D. Venetia MaxonStern MD on 03/22/2014 01:27 PM

## 2014-05-12 NOTE — Brief Op Note (Signed)
05/12/2014  4:09 PM  PATIENT:  Sean Poole  58 y.o. 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 Anterior cervical decompression/diskectomy/fusion (N/A) with allograft, autograft, plate  SURGEON:  Surgeon(s) and Role:    * Lajuanda Penick, MD - Primary  PHYSICIAN ASSISTANT:   ASSISTANTS: Poteat, RN   ANESTHESIA:   general  EBL:  Total I/O In: 1300 [I.V.:1300] Out: -   BLOOD ADMINISTERED:none  DRAINS: none   LOCAL MEDICATIONS USED:  LIDOCAINE   SPECIMEN:  No Specimen  DISPOSITION OF SPECIMEN:  N/A  COUNTS:  YES  TOURNIQUET:  * No tourniquets in log *  DICTATION: DICTATION: Patient is 58 year old male with herniation of disc C 45 with right C 5 radiculopathy and a severely spondylotic facet joint at C 45 on the the right with stenosis, cervicalgia.  He did not get relief with right C 45 facet injections.  It was elected to take him to surgery for anterior cervical decompression and fusion C 45 level.  PROCEDURE: Patient was brought to operating room and following the smooth and uncomplicated induction of general endotracheal anesthesia his head was placed on a horseshoe head holder he was placed in 5 pounds of Holter traction and his anterior neck was prepped and draped in usual sterile fashion. An incision was made on the left side of midline after infiltrating the skin and subcutaneous tissues with local lidocaine. The platysmal layer was incised and subplatysmal dissection was performed exposing the anterior border sternocleidomastoid muscle. Using blunt dissection the carotid sheath was kept lateral and trachea and esophagus kept medial exposing  the anterior cervical spine. A bent spinal needle was placed it was felt to be the C 34 level and this was confirmed on intraoperative x-ray. Longus coli muscles were taken down from the anterior cervical spine using electrocautery and key elevator and self-retaining retractor was placed exposing the C 45 level. The interspace was incised and a thorough discectomy was performed. Distraction pins were placed. Uncinate spurs and central spondylitic ridges were drilled down with a high-speed drill. The spinal cord dura and both C5 nerve roots were widely decompressed, particularly on the right.  At this level, the nerve root was widely decompressed. Hemostasis was assured. After trial sizing an 8 mm lordotic allograft bone wedge was selected and packed with local autograft. This was tamped into position and countersunk appropriately. Distraction weight was removed. A 24 mm Nuvasive Archon anterior cervical plate was affixed to the cervical spine with 13 mm variable-angle screws 2 at C4, 2 at C5. All screws were well-positioned and locking mechanisms were engaged. A final X ray was obtained which showed well positioned graft and anterior plate without complicating features. Soft tissues were inspected and found to be in good repair. The wound was irrigated. The platysma layer was closed with 3-0 Vicryl stitches and the skin was reapproximated with 3-0 Vicryl subcuticular stitches. The wound was dressed with Dermabond. Counts were correct at the end of the case. Patient was extubated and taken to recovery in stable and satisfactory condition.   PLAN OF CARE: Admit for overnight observation  PATIENT DISPOSITION:  PACU - hemodynamically stable.   Delay start of Pharmacological VTE agent (>24hrs) due to surgical blood loss or risk of bleeding: yes  

## 2014-05-12 NOTE — Transfer of Care (Signed)
Immediate Anesthesia Transfer of Care Note  Patient: Sean ComberRobert Ace  Procedure(s) Performed: Procedure(s): Cervical four/five. Anterior cervical decompression/diskectomy/fusion (N/A)  Patient Location: PACU  Anesthesia Type:General  Level of Consciousness: awake, oriented and patient cooperative  Airway & Oxygen Therapy: Patient Spontanous Breathing and Patient connected to nasal cannula oxygen  Post-op Assessment: Report given to PACU RN and Post -op Vital signs reviewed and stable  Post vital signs: Reviewed  Complications: No apparent anesthesia complications

## 2014-05-12 NOTE — Progress Notes (Signed)
Awake, alert, conversant.  Full strength both upper extremities, including bilateral deltoids.  Doing well.

## 2014-05-12 NOTE — Anesthesia Postprocedure Evaluation (Signed)
  Anesthesia Post-op Note  Patient: Sean ComberRobert Poole  Procedure(s) Performed: Procedure(s): Cervical four/five. Anterior cervical decompression/diskectomy/fusion (N/A)  Patient Location: PACU  Anesthesia Type:General  Level of Consciousness: awake, alert  and oriented  Airway and Oxygen Therapy: Patient Spontanous Breathing and Patient connected to nasal cannula oxygen  Post-op Pain: mild  Post-op Assessment: Post-op Vital signs reviewed, Patient's Cardiovascular Status Stable, Respiratory Function Stable, Patent Airway, No signs of Nausea or vomiting and Pain level controlled  Post-op Vital Signs: stable  Last Vitals:  Filed Vitals:   05/12/14 1804  BP: 132/75  Pulse: 86  Temp: 36.6 C  Resp: 18    Complications: No apparent anesthesia complications

## 2014-05-12 NOTE — Anesthesia Preprocedure Evaluation (Signed)
Anesthesia Evaluation  Patient identified by MRN, date of birth, ID band Patient awake    Reviewed: Allergy & Precautions, H&P , NPO status , Patient's Chart, lab work & pertinent test results  Airway Mallampati: II  TM Distance: >3 FB     Dental  (+) Teeth Intact, Dental Advisory Given   Pulmonary Current Smoker,  breath sounds clear to auscultation        Cardiovascular hypertension, Rhythm:Regular Rate:Normal     Neuro/Psych    GI/Hepatic   Endo/Other    Renal/GU      Musculoskeletal   Abdominal (+) + obese,   Peds  Hematology   Anesthesia Other Findings   Reproductive/Obstetrics                             Anesthesia Physical Anesthesia Plan  ASA: III  Anesthesia Plan: General   Post-op Pain Management:    Induction: Intravenous  Airway Management Planned: Oral ETT  Additional Equipment:   Intra-op Plan:   Post-operative Plan: Extubation in OR  Informed Consent: I have reviewed the patients History and Physical, chart, labs and discussed the procedure including the risks, benefits and alternatives for the proposed anesthesia with the patient or authorized representative who has indicated his/her understanding and acceptance.   Dental advisory given  Plan Discussed with: CRNA and Anesthesiologist  Anesthesia Plan Comments:         Anesthesia Quick Evaluation

## 2014-05-12 NOTE — Interval H&P Note (Signed)
History and Physical Interval Note:  05/12/2014 7:27 AM  Sean Comberobert Jarrard  has presented today for surgery, with the diagnosis of Displacement of intervertebral disc mid cervical region  The various methods of treatment have been discussed with the patient and family. After consideration of risks, benefits and other options for treatment, the patient has consented to  Procedure(s) with comments: C4-5 Anterior cervical decompression/diskectomy/fusion (N/A) - C4-5 Anterior cervical decompression/diskectomy/fusion as a surgical intervention .  The patient's history has been reviewed, patient examined, no change in status, stable for surgery.  I have reviewed the patient's chart and labs.  Questions were answered to the patient's satisfaction.     Sarenity Ramaker D

## 2014-05-12 NOTE — Anesthesia Procedure Notes (Signed)
Procedure Name: Intubation Date/Time: 05/12/2014 2:29 PM Performed by: Armandina GemmaMIRARCHI, Marieann Zipp Pre-anesthesia Checklist: Patient identified, Timeout performed, Emergency Drugs available, Suction available and Patient being monitored Patient Re-evaluated:Patient Re-evaluated prior to inductionOxygen Delivery Method: Circle system utilized Preoxygenation: Pre-oxygenation with 100% oxygen Intubation Type: IV induction Ventilation: Mask ventilation without difficulty Grade View: Grade I Tube type: Oral Tube size: 7.5 mm Number of attempts: 1 Airway Equipment and Method: Stylet and Video-laryngoscopy Placement Confirmation: ETT inserted through vocal cords under direct vision and positive ETCO2 Secured at: 22 cm Tube secured with: Tape Dental Injury: Teeth and Oropharynx as per pre-operative assessment  Comments: IV induction Joslin- intubation AM CRNA atraumatic teeth and mouth as preop- Bilat BS Joslin

## 2014-05-13 DIAGNOSIS — M5012 Cervical disc disorder with radiculopathy, mid-cervical region: Secondary | ICD-10-CM | POA: Diagnosis not present

## 2014-05-13 MED ORDER — DIAZEPAM 5 MG PO TABS: 5.0000 mg | ORAL_TABLET | Freq: Four times a day (QID) | ORAL | Status: DC | PRN

## 2014-05-13 MED ORDER — OXYCODONE-ACETAMINOPHEN 5-325 MG PO TABS: 1.0000 | ORAL_TABLET | ORAL | Status: DC | PRN

## 2014-05-13 NOTE — Discharge Summary (Signed)
Physician Discharge Summary  Patient ID: Sean ComberRobert Poole MRN: [id number] DOB/AGE: 12/20/1955 58 y.o.  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 rate 18, height 5\' 10"  (1.778 m), weight 92.08 kg (203 lb), SpO2 95 %. Neurologic: Alert and oriented X 3, normal strength and tone. Normal symmetric reflexes. Normal coordination and gait Wound:CDI  Disposition: Home     Medication List    TAKE these medications        budesonide 3 MG 24 hr capsule  Commonly known as:  ENTOCORT EC  Take 6 mg by mouth daily.     cefdinir 300 MG capsule  Commonly known as:  OMNICEF  Take 300 mg by mouth 2 (two) times daily.     cyclobenzaprine 10 MG tablet  Commonly known as:  FLEXERIL  Take 10 mg by mouth 3 (three) times daily as needed for muscle spasms.     diazepam 5 MG tablet  Commonly known as:  VALIUM  Take 1 tablet (5 mg total) by mouth every 6 (six) hours as needed for muscle spasms.     esomeprazole 40 MG capsule  Commonly known as:  NEXIUM  Take 40 mg by mouth daily at 12 noon.     folic acid 1 MG tablet  Commonly known as:  FOLVITE  Take 1 mg by mouth daily.     HYDROcodone-acetaminophen 7.5-325 MG per tablet  Commonly known as:  NORCO  Take 1 tablet by mouth every 6 (six) hours as needed for moderate pain.     methotrexate 2.5 MG tablet  Commonly known as:  RHEUMATREX  Take 2.5 mg by mouth once a  week. Caution:Chemotherapy. Protect from light.- Takes 10 tabs on Wednesday evening.     niacin-simvastatin 1000-20 MG 24 hr tablet  Commonly known as:  SIMCOR  Take 1 tablet by mouth at bedtime.     olmesartan 20 MG tablet  Commonly known as:  BENICAR  Take 20 mg by mouth daily.     olmesartan 20 MG tablet  Commonly known as:  BENICAR  Take 20 mg by mouth daily.     oxyCODONE-acetaminophen 5-325 MG per tablet  Commonly known as:  PERCOCET/ROXICET  Take 1-2 tablets by mouth every 4 (four) hours as needed for moderate pain or severe pain.         Signed: Dorian HeckleSTERN,Falen Lehrmann D, MD 05/13/2014, 6:07 AM

## 2014-05-13 NOTE — Progress Notes (Signed)
Pt and admitted and underwent anterior cervical decompression/diskectomy/fusion 4-5. Pt standing at his door ready to go when I arrived. I gave him post cervical surgery handout, went over this with him and answered his question about sleeping on his stomach--NO, only back or side. He says he has A prn at home.Pt is D/C'd. Ignacia PalmaCathy Imogene Gravelle, OTR/L [phone] 05/13/2014

## 2014-05-13 NOTE — Progress Notes (Signed)
Discharge instructions/papers given and explained to patient with wife at bedside and they both verbalized understanding.

## 2014-05-13 NOTE — Progress Notes (Signed)
Subjective: Patient reports doing well  Objective: Vital signs in last 24 hours: Temp:  [97.5 F (36.4 C)-98.3 F (36.8 C)] 98.3 F (36.8 C) (11/14 0413) Pulse Rate:  [72-93] 81 (11/14 0413) Resp:  [15-39] 18 (11/14 0413) BP: (110-144)/(72-87) 115/75 mmHg (11/14 0413) SpO2:  [92 %-100 %] 95 % (11/14 0413) Weight:  [92.08 kg (203 lb)] 92.08 kg (203 lb) (11/13 1202)  Intake/Output from previous day: 11/13 0701 - 11/14 0700 In: 1960 [P.O.:560; I.V.:1400] Out: -  Intake/Output this shift: Total I/O In: 240 [P.O.:240] Out: -   Physical Exam: Full strength, no numbness.  Dressing CDI  Lab Results: No results for input(s): WBC, HGB, HCT, PLT in the last 72 hours. BMET No results for input(s): NA, K, CL, CO2, GLUCOSE, BUN, CREATININE, CALCIUM in the last 72 hours.  Studies/Results: Dg Cervical Spine 2-3 Views  05/12/2014   CLINICAL DATA:  C4 ACDF for displacement of a cervical intervertebral disc.  EXAM: CERVICAL SPINE - 2-3 VIEW  COMPARISON:  Cervical spine radiographs 02/13/2014.  FINDINGS: The initial image demonstrates the needle at the C3-4 disc level. The patient is intubated. A second image demonstrates ACDF at C4-5. A disc spacer is in place. A surgical sponges noted anteriorly.  IMPRESSION: 1. Status post ACDF at C4-5 without radiographic evidence for complication.   Electronically Signed   By: Gennette Pachris  Mattern M.D.   On: 05/12/2014 16:07    Assessment/Plan: Doing well.  D/C home.    LOS: 1 day    Dorian HeckleSTERN,Almer Littleton D, MD 05/13/2014, 6:06 AM

## 2014-05-15 ENCOUNTER — Encounter (HOSPITAL_COMMUNITY): Payer: Self-pay | Admitting: Neurosurgery

## 2016-09-03 ENCOUNTER — Other Ambulatory Visit: Payer: Self-pay | Admitting: Neurosurgery

## 2016-09-24 ENCOUNTER — Encounter: Payer: Self-pay | Admitting: Neurology

## 2016-09-24 ENCOUNTER — Ambulatory Visit (INDEPENDENT_AMBULATORY_CARE_PROVIDER_SITE_OTHER): Payer: Medicare Other | Admitting: Neurology

## 2016-09-24 VITALS — BP 142/78 | HR 75 | Ht 70.0 in | Wt 194.0 lb

## 2016-09-24 DIAGNOSIS — I639 Cerebral infarction, unspecified: Secondary | ICD-10-CM | POA: Diagnosis not present

## 2016-09-24 DIAGNOSIS — M5030 Other cervical disc degeneration, unspecified cervical region: Secondary | ICD-10-CM | POA: Diagnosis not present

## 2016-09-24 DIAGNOSIS — G4733 Obstructive sleep apnea (adult) (pediatric): Secondary | ICD-10-CM

## 2016-09-24 DIAGNOSIS — R202 Paresthesia of skin: Secondary | ICD-10-CM | POA: Insufficient documentation

## 2016-09-24 DIAGNOSIS — I6381 Other cerebral infarction due to occlusion or stenosis of small artery: Secondary | ICD-10-CM

## 2016-09-24 DIAGNOSIS — M47812 Spondylosis without myelopathy or radiculopathy, cervical region: Secondary | ICD-10-CM | POA: Insufficient documentation

## 2016-09-24 NOTE — Patient Instructions (Signed)
I had a long d/w patient and his wife about his recent  stroke,post stroke paresthesias and perioperative risk for stroke with neck surgery  risk for recurrent stroke/TIAs, personally independently reviewed imaging studies and stroke evaluation results and answered questions.Continue aspirin 325 mg daily  for secondary stroke prevention and maintain strict control of hypertension with blood pressure goal below 130/90, diabetes with hemoglobin A1c goal below 6.5% and lipids with LDL cholesterol goal below 70 mg/dL. I also advised the patient to eat a healthy diet with plenty of whole grains, cereals, fruits and vegetables, exercise regularly and maintain ideal body weight .He does have a PFO but his ROPE score is only 3 points making it 0% chance that stroke is due to PFO .I recommend he post fall on his cervical spine surgery by at least 2-3 months to recover from his stroke in order to minimize chances of stroke recurrence or worsening of his deficits in the perioperative period .he voiced understanding. I also ordered polysomnogram for sleep apnea Followup in the future with me in 2 months or call earlier if necessary.  Stroke Prevention Some medical conditions and behaviors are associated with an increased chance of having a stroke. You may prevent a stroke by making healthy choices and managing medical conditions. How can I reduce my risk of having a stroke?  Stay physically active. Get at least 30 minutes of activity on most or all days.  Do not smoke. It may also be helpful to avoid exposure to secondhand smoke.  Limit alcohol use. Moderate alcohol use is considered to be:  No more than 2 drinks per day for men.  No more than 1 drink per day for nonpregnant women.  Eat healthy foods. This involves:  Eating 5 or more servings of fruits and vegetables a day.  Making dietary changes that address high blood pressure (hypertension), high cholesterol, diabetes, or obesity.  Manage your  cholesterol levels.  Making food choices that are high in fiber and low in saturated fat, trans fat, and cholesterol may control cholesterol levels.  Take any prescribed medicines to control cholesterol as directed by your health care provider.  Manage your diabetes.  Controlling your carbohydrate and sugar intake is recommended to manage diabetes.  Take any prescribed medicines to control diabetes as directed by your health care provider.  Control your hypertension.  Making food choices that are low in salt (sodium), saturated fat, trans fat, and cholesterol is recommended to manage hypertension.  Ask your health care provider if you need treatment to lower your blood pressure. Take any prescribed medicines to control hypertension as directed by your health care provider.  If you are 58-74 years of age, have your blood pressure checked every 3-5 years. If you are 43 years of age or older, have your blood pressure checked every year.  Maintain a healthy weight.  Reducing calorie intake and making food choices that are low in sodium, saturated fat, trans fat, and cholesterol are recommended to manage weight.  Stop drug abuse.  Avoid taking birth control pills.  Talk to your health care provider about the risks of taking birth control pills if you are over 31 years old, smoke, get migraines, or have ever had a blood clot.  Get evaluated for sleep disorders (sleep apnea).  Talk to your health care provider about getting a sleep evaluation if you snore a lot or have excessive sleepiness.  Take medicines only as directed by your health care provider.  For some people,  aspirin or blood thinners (anticoagulants) are helpful in reducing the risk of forming abnormal blood clots that can lead to stroke. If you have the irregular heart rhythm of atrial fibrillation, you should be on a blood thinner unless there is a good reason you cannot take them.  Understand all your medicine  instructions.  Make sure that other conditions (such as anemia or atherosclerosis) are addressed. Get help right away if:  You have sudden weakness or numbness of the face, arm, or leg, especially on one side of the body.  Your face or eyelid droops to one side.  You have sudden confusion.  You have trouble speaking (aphasia) or understanding.  You have sudden trouble seeing in one or both eyes.  You have sudden trouble walking.  You have dizziness.  You have a loss of balance or coordination.  You have a sudden, severe headache with no known cause.  You have new chest pain or an irregular heartbeat. Any of these symptoms may represent a serious problem that is an emergency. Do not wait to see if the symptoms will go away. Get medical help at once. Call your local emergency services (911 in [place] Do not drive yourself to the hospital. This information is not intended to replace advice given to you by your health care provider. Make sure you discuss any questions you have with your health care provider. Document Released: 07/24/2004 Document Revised: 11/22/2015 Document Reviewed: 12/17/2012 Elsevier Interactive Patient Education  2017 [employer]

## 2016-09-24 NOTE — Progress Notes (Signed)
[facility] [address] [place]. [place] [address] [phone]       OFFICE CONSULT NOTE  [name] Date of Birth:  06/17/1956 Medical Record Number:  [id number]   Referring MD:  Maeola Harman  Reason for Referral:  Stroke  HPI: [name] is a 61 year old Caucasian male who is accompanied today by his wife. History is obtained from the patient and wife as well as review of medical records and imaging studies sent with him which I have personally reviewed. He states he had a usual day until last Saturday when he went to sleep. When he woke up on 09/21/16 he noticed some tingling and numbness involving his right lip, a few fingers in his right hand as well as a few toes on the right. As the day went on his numbness progressed to involve the whole right side. [facility] went to [facility] where he was hospitalized. CT scan of the head on admission was unremarkable. MRI scan of the brain showed a small left basal ganglia lacunar infarct. 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 MRA of the brain was not done. Carotid ultrasound showed less than 50% bilateral carotid stenosis. Transthoracic echo was unremarkable except for positive bubble study indicative of patent foramen ovale. I did not see any results about lipid profile and A1c however the patient was started on aspirin as well as Zocor. Patient was previously on Goody powders but states he is not taking it every day. Patient states she was asked to quit smoking which he has done. He still has significant paresthesias on the right side which is annoying but he says is getting used to it. It is not distressing enough for him to want medication for this. He has noticed diminished fine motor skills in the right hand and difficulty with writing. He denied any associated headache, loss of vision, speech difficulty or extremity  weakness. His noticed that his balance is slightly off since this happened and he has to be careful while walking. He's had no falls or injuries. He denies any prior history of strokes, TIAs, migraines or significant neurological problems. He does have history of degenerative cervical spine disease and had undergone cervical spine fusion by Dr. Venetia Maxon but his had recurrent and persistent symptoms of neck and left arm radicular pain and a recent MRI of the C-spine showed significant degenerative disease at C5-6 with severe left-sided foraminal and at C6-7-6 bilateral foraminal stenosis. Patient was referred to Dr. Venetia Maxon for repeat C-spine surgery which he needs but due to his recent stroke he was referred to me to discuss timing of surgery.. ROS:   14 system review of systems is positive for  numbness, tingling, joint pain, ringing in the ears, tremor, snoring, restless legs, restless sleep and all other systems negative  PMH:  Past Medical History:  Diagnosis Date  . Arthritis   . Broken ribs 2005  . Clavicular fracture   . Crohn's disease (HCC)   . DDD (degenerative disc disease), lumbar   . GERD (gastroesophageal reflux disease)   . Hypertension   . Hypothyroidism   . Numbness and tingling    right arm and thighs  . Sinus disease   . Stroke [facility])     Social History:  Social History   Social History  . Marital status: Married    Spouse name: N/A  . Number of  children: N/A  . Years of education: N/A   Occupational History  . Not on file.   Social History Main Topics  . Smoking status: Former Smoker    Packs/day: 0.50    Years: 10.00    Quit date: 09/21/2016  . Smokeless tobacco: Never Used  . Alcohol use No  . Drug use: No  . Sexual activity: Not on file   Other Topics Concern  . Not on file   Social History Narrative  . No narrative on file    Medications:   Current Outpatient Prescriptions on File Prior to Visit  Medication Sig Dispense Refill  .  HYDROcodone-acetaminophen (NORCO) 7.5-325 MG per tablet Take 1 tablet by mouth every 6 (six) hours as needed for moderate pain.     No current facility-administered medications on file prior to visit.     Allergies:   Allergies  Allergen Reactions  . Triamterene Hives    Pt was given IV, and caused a breakout    Physical Exam General: well developed, well nourished Middle aged Caucasian male, seated, in no evident distress Head: head normocephalic and atraumatic.   Neck: supple with no carotid or supraclavicular bruits Cardiovascular: regular rate and rhythm, no murmurs Musculoskeletal: no deformity Skin:  no rash/petichiae Vascular:  Normal pulses all extremities  Neurologic Exam Mental Status: Awake and fully alert. Oriented to place and time. Recent and remote memory intact. Attention span, concentration and fund of knowledge appropriate. Mood and affect appropriate.  Cranial Nerves: Fundoscopic exam reveals sharp disc margins. Pupils equal, briskly reactive to light. Extraocular movements full without nystagmus. Visual fields full to confrontation. Hearing intact. Facial sensation intact. Face, tongue, palate moves normally and symmetrically.  Motor: Normal bulk and tone. Normal strength in all tested extremity muscles. Sensory.: intact to touch , pinprick , position and vibratory sensation. Subjective paresthesias on the right hemibody including trunk but no objective sensory loss  Coordination: Rapid alternating movements normal in all extremities. Finger-to-nose and heel-to-shin performed accurately bilaterally. Gait and Station: Arises from chair without difficulty. Stance is normal. Gait demonstrates normal stride length and balance . Able to heel, toe and tandem walk with some  difficulty.  Reflexes: 2+ and symmetric. Toes downgoing.   NIHSS  1 Modified Rankin  2  ASSESSMENT: [age] year Caucasian male with right hemibody post stroke paresthesias from left basal ganglia  lacunar infarct 5 days ago due to small vessel disease. Vascular risk factors of hypertension, hyperlipidemia and smoking. History of left acoustic neuroma which is stable and degenerative cervical spine disease with persistent neck and left arm radicular pain    PLAN: I had a long d/w patient and his wife about his recent  stroke,post stroke paresthesias and perioperative risk for stroke with neck surgery  risk for recurrent stroke/TIAs, personally independently reviewed imaging studies and stroke evaluation results and answered questions.Continue aspirin 325 mg daily  for secondary stroke prevention and maintain strict control of hypertension with blood pressure goal below 130/90, diabetes with hemoglobin A1c goal below 6.5% and lipids with LDL cholesterol goal below 70 mg/dL. I also advised the patient to eat a healthy diet with plenty of whole grains, cereals, fruits and vegetables, exercise regularly and maintain ideal body weight .He does have a PFO but his ROPE score is only 3 points making it 0% chance that stroke is due to PFO .I have complimented him on smoking cessation and advised him to continue to do so. I recommend he postpone his cervical spine surgery  by at least 2-3 months to recover from his stroke in order to minimize chances of stroke recurrence or worsening of his deficits in the perioperative period .he voiced understanding. I also ordered polysomnogram for sleep apnea Followup in the future with me in 2 months or call earlier if necessary. Delia Heady, MD  [facility] [address] [place], [place] [address]  Phone [phone] Fax [phone] Note: This document was prepared with digital dictation and possible smart phrase technology. Any transcriptional errors that result from this process are unintentional.

## 2016-09-26 ENCOUNTER — Encounter: Payer: Self-pay | Admitting: Neurosurgery

## 2016-09-29 ENCOUNTER — Inpatient Hospital Stay (HOSPITAL_COMMUNITY): Admission: RE | Admit: 2016-09-29 | Payer: Self-pay | Source: Ambulatory Visit

## 2016-09-30 ENCOUNTER — Encounter: Payer: Self-pay | Admitting: Neurology

## 2016-10-07 ENCOUNTER — Encounter (HOSPITAL_COMMUNITY): Admission: RE | Payer: Self-pay | Source: Ambulatory Visit

## 2016-10-07 ENCOUNTER — Inpatient Hospital Stay (HOSPITAL_COMMUNITY): Admission: RE | Admit: 2016-10-07 | Payer: Medicare Other | Source: Ambulatory Visit | Admitting: Neurosurgery

## 2016-10-07 SURGERY — ANTERIOR CERVICAL DECOMPRESSION/DISCECTOMY FUSION 2 LEVELS
Anesthesia: General

## 2016-11-05 NOTE — Progress Notes (Signed)
[facility].

## 2016-11-20 ENCOUNTER — Ambulatory Visit (INDEPENDENT_AMBULATORY_CARE_PROVIDER_SITE_OTHER): Payer: Medicare Other | Admitting: Neurology

## 2016-11-20 ENCOUNTER — Encounter: Payer: Self-pay | Admitting: Neurology

## 2016-11-20 VITALS — BP 125/79 | HR 63 | Resp 20 | Ht 69.0 in | Wt 190.0 lb

## 2016-11-20 DIAGNOSIS — I639 Cerebral infarction, unspecified: Secondary | ICD-10-CM

## 2016-11-20 DIAGNOSIS — I6350 Cerebral infarction due to unspecified occlusion or stenosis of unspecified cerebral artery: Secondary | ICD-10-CM

## 2016-11-20 DIAGNOSIS — I69393 Ataxia following cerebral infarction: Secondary | ICD-10-CM | POA: Diagnosis not present

## 2016-11-20 DIAGNOSIS — R0683 Snoring: Secondary | ICD-10-CM

## 2016-11-20 DIAGNOSIS — I6381 Other cerebral infarction due to occlusion or stenosis of small artery: Secondary | ICD-10-CM

## 2016-11-20 NOTE — Progress Notes (Signed)
SLEEP MEDICINE CLINIC   Provider:  Melvyn Novas, M D  Primary Care Physician:  Pomposini, Rande Brunt, MD   Referring Provider: Eldridge Abrahams, MD    Chief Complaint  Patient presents with  . New Patient (Initial Visit)    never had a sleep study, snores    HPI:  Sean Poole is a 61 y.o. male , seen here as in a referral from Dr. Pearlean Brownie ,   Chief complaint according to patient :  Sleep habits are as follows: The patient's current bedtime is between 9 PM and 9:30 PM and he describes himself as going promptly to sleep. He is a prone sleeper, and uses one pillow. He usually has a bathroom break at 5 AM but until that seems to sleep through, he can go back to sleep and rises finally around 7:30 AM. He complains neither dry mouth nor headaches, he is not nauseated does not have palpitations in the morning, there is no diaphoresis either. Sleep has not been a problem for him. His wife reports that he snores but she has not witnessed him to have apneic pauses, he is not restless, is no evidence of periodic limb movements, dream enactments.   Sleep medical history and family sleep history: see stroke note 09-21-2016, lacune  Left brain. Spine DDD, bone spurs. No family history of apnea, OSA, not having sleepwalked.  The patient has cervical bilateral foraminal stenosis and has a history of C-spine surgery. He suffered rib fractures in the past clavicular fracture, has been diagnosed with Crohn's disease, gastroesophageal reflux disease, hypertension, hypothyroidism, numbness and tingling, sinus disease and most recently stroke.  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 Irregular work hours, for 40 years. The patient quit smoking at the time of his stroke, then half a pack per day smoker. He has not been drinking alcohol for 12 years. He took caffeine sometimes in pill form, sometimes it from of  [facility] or coffee.    DR Pearlean Brownie 's note ; Reason  for Referral:  Stroke  HPI: Sean Poole is a 61 year old Caucasian male who is accompanied today by his wife. History is obtained from the patient and wife as well as review of medical records and imaging studies sent with him which I have personally reviewed. He states he had a usual day until last Saturday when he went to sleep. When he woke up on 09/21/16 he noticed some tingling and numbness involving his right lip, a few fingers in his right hand as well as a few toes on the right. As the day went on his numbness progressed to involve the whole right side. He went to [facility] where he was hospitalized.  CT scan of the head on admission was unremarkable. MRI scan of the brain showed a small left basal ganglia lacunar infarct. 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 MRA of the brain was not done. Carotid ultrasound showed less than 50% bilateral carotid stenosis. Transthoracic echo was unremarkable except for positive bubble study indicative of patent foramen ovale. I did not see any results about lipid profile and A1c however the patient was started on aspirin as well as Zocor. Patient was previously on Goody powders but states he is not taking it every day. Patient states she was asked to quit smoking which he has done. He still  has significant paresthesias on the right side which is annoying but he says is getting used to it. It is not distressing enough for him to want medication for this. He has noticed diminished fine motor skills in the right hand and difficulty with writing. He denied any associated headache, loss of vision, speech difficulty or extremity weakness. His noticed that his balance is slightly off since this happened and he has to be careful while walking. He's had no falls or injuries. He denies any prior history of strokes, TIAs, migraines or significant neurological problems.  He does have history of degenerative cervical spine disease and had undergone cervical spine fusion by Dr. Venetia Maxon but his had recurrent and persistent symptoms of neck and left arm radicular pain and a recent MRI of the C-spine showed significant degenerative disease at C5-6 with severe left-sided foraminal and at C6-7-6 bilateral foraminal stenosis. Patient was referred to Dr. Venetia Maxon for repeat C-spine surgery which he needs but due to his recent stroke he was referred to me to discuss timing of surgery..   Review of Systems: Out of a complete 14 system review, the patient complains of only the following symptoms, and all other reviewed systems are negative.  numbness - right hand armpit and right toe.  He sleeps well- anywhere anytime.  Epworth score  6 , Fatigue severity score   , depression score n/a    Social History   Social History  . Marital status: Married    Spouse name: N/A  . Number of children: N/A  . Years of education: N/A   Occupational History  . Not on file.   Social History Main Topics  . Smoking status: Former Smoker    Packs/day: 0.50    Years: 10.00    Quit date: 09/21/2016  . Smokeless tobacco: Never Used  . Alcohol use No  . Drug use: No  . Sexual activity: Not on file   Other Topics Concern  . Not on file   Social History Narrative  . No narrative on file    No family history on file.  Past Medical History:  Diagnosis Date  . Arthritis   . Broken ribs 2005  . Clavicular fracture   . Crohn's disease (HCC)   . DDD (degenerative disc disease), lumbar   . GERD (gastroesophageal reflux disease)   . Hypertension   . Hypothyroidism   . Numbness and tingling    right arm and thighs  . Sinus disease   . Stroke [facility])     Past Surgical History:  Procedure Laterality Date  . ANTERIOR CERVICAL DECOMP/DISCECTOMY FUSION N/A 05/12/2014   Procedure: Cervical four/five. Anterior cervical decompression/diskectomy/fusion;  Surgeon: Maeola Harman, MD;   Location: [facility];  Service: Neurosurgery;  Laterality: N/A;  . COLONOSCOPY W/ BIOPSIES    . EYE SURGERY     when he was a child, doesn't remember which eye  . KNEE ARTHROSCOPY Right 2010  . NASAL SINUS SURGERY      Current Outpatient Prescriptions  Medication Sig Dispense Refill  . Diphenhydramine-APAP, sleep, (GOODYS PM PO) Take by mouth.    . esomeprazole (NEXIUM) 40 MG capsule Take 40 mg by mouth.    . gabapentin (NEURONTIN) 300 MG capsule Take 300 mg by mouth 3 (three) times daily.    Marland Kitchen HYDROcodone-acetaminophen (NORCO) 7.5-325 MG per tablet Take 1 tablet by mouth every 6 (six) hours as needed for moderate pain.    . simvastatin (ZOCOR) 40 MG tablet     .  tamsulosin (FLOMAX) 0.4 MG CAPS capsule     . tiZANidine (ZANAFLEX) 4 MG tablet Take 8 mg by mouth at bedtime.     No current facility-administered medications for this visit.     Allergies as of 11/20/2016 - Review Complete 11/20/2016  Allergen Reaction Noted  . Ticarcillin-pot clavulanate    . Triamterene Hives 04/28/2014    Vitals: BP 125/79   Pulse 63   Resp 20   Ht 5\' 9"  (1.753 m)   Wt 190 lb (86.2 kg)   BMI 28.06 kg/m  Last Weight:  Wt Readings from Last 1 Encounters:  11/20/16 190 lb (86.2 kg)   WGN:FAOZBMI:Body mass index is 28.06 kg/m.     Last Height:   Ht Readings from Last 1 Encounters:  11/20/16 5\' 9"  (1.753 m)    Physical exam:  General: The patient is awake, alert and appears not in acute distress. The patient is well groomed. Head: Normocephalic, atraumatic. Neck is supple. Mallampati 3,  neck circumference: 17. Nasal airflow patent , severely deviated septum - has frequently sinusitis Cardiovascular:  Regular rate and rhythm , without  murmurs or carotid bruit, and without distended neck veins. Respiratory: Lungs are clear to auscultation. Skin:  Without evidence of edema, or rash. Trunk: BMI is 28. The patient's posture is erect   Neurologic exam : The patient is awake and alert, oriented to  place and time.  Attention span & concentration ability appears normal. Speech is fluent,  without  dysarthria, dysphonia or aphasia. Mood and affect are appropriate.  Cranial nerves: Pupils are equal and briskly reactive to light.  Extraocular movements  in vertical and horizontal planes intact and without nystagmus. Visual fields by finger perimetry are intact. Hearing to finger rub intact.  Facial sensation intact to fine touch. Facial motor strength is symmetric and tongue and uvula move midline.  Shoulder shrug was symmetrical.  Motor exam: Normal tone, muscle bulk and symmetric strength in all extremities. Sensory:  Fine touch, pinprick and vibration were abnormal in right hand temperature  sense is preserved. . Proprioception tested in the upper extremities affected  Coordination: Rapid alternating movements in the fingers/hands was normal.  Finger-to-nose maneuver  With  evidence of ataxia, dysmetria not  Tremor on the left . Gait and station: Patient walks without assistive device - arthralgic. Strength within normal limits. Stance is wide based.  Tandem gait is fragmented. Turns with 4 Steps. Romberg testing is positive..  Deep tendon reflexes: in the upper and lower extremities are symmetric and intact. Babinski maneuver response is downgoing.  Assessment:  After physical and neurologic examination, review of laboratory studies,  Personal review of imaging studies, reports of other /same  Imaging studies, results of polysomnography and / or neurophysiology testing and pre-existing records as far as provided in visit., my assessment is   1) Mr. [name] is at higher risk of having obstructive sleep apnea since he had a stroke within the last 2 month. However his stroke was low according the main risk factor for local his hypertension. He has realized that he has unpredictable blood pressure spikes, which do not last long. It would be beneficial to measure his blood pressure before the sleep  study and right after. Should be find that the patient is not just a snorer but does present with obstructive sleep apnea I would like to treat him with CPAP depending on the degree and associated comorbidities. He will need to see if apnea is correlated to hypoxemia.  2)  gait ataxia, numbness from lacunar stroke-  See Dr Marlis Edelson exam.   The patient was advised of the nature of the diagnosed disorder , the treatment options and the  risks for general health and wellness arising from not treating the condition.  I reviewed his hospital records, images and Echo.  I spent more than 30 minutes of face to face time with the patient.  Greater than 50% of time was spent in counseling and coordination of care. We have discussed the diagnosis and differential and I answered the patient's questions.    Plan:  Treatment plan and additional workup : SPLIT night followed by MD visit.     Melvyn Novas, MD 11/20/2016, 8:16 AM  Certified in Neurology by ABPN Certified in Sleep Medicine by [facility] [address], [address] [place], [place] [address]

## 2016-12-03 ENCOUNTER — Ambulatory Visit (INDEPENDENT_AMBULATORY_CARE_PROVIDER_SITE_OTHER): Payer: Medicare Other | Admitting: Neurology

## 2016-12-03 ENCOUNTER — Encounter: Payer: Self-pay | Admitting: Neurology

## 2016-12-03 VITALS — BP 138/87 | HR 76 | Ht 68.0 in | Wt 188.8 lb

## 2016-12-03 DIAGNOSIS — R200 Anesthesia of skin: Secondary | ICD-10-CM

## 2016-12-03 DIAGNOSIS — I639 Cerebral infarction, unspecified: Secondary | ICD-10-CM

## 2016-12-03 NOTE — Progress Notes (Signed)
[facility] [address] [place]. [place] [address] [phone]       OFFICE CONSULT NOTE  [name] Date of Birth:  11-Jun-1956 Medical Record Number:  [id number]   Referring MD:  Maeola Harman  Reason for Referral:  Stroke  HPI: Initial Consult 09/24/2016 :[name] is a 61 year old Caucasian male who is accompanied today by his wife. History is obtained from the patient and wife as well as review of medical records and imaging studies sent with him which I have personally reviewed. He states he had a usual day until last Saturday when he went to sleep. When he woke up on 09/21/16 he noticed some tingling and numbness involving his right lip, a few fingers in his right hand as well as a few toes on the right. As the day went on his numbness progressed to involve the whole right side. He went to [facility] where he was hospitalized. CT scan of the head on admission was unremarkable. MRI scan of the brain showed a small left basal ganglia lacunar infarct. 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 MRA of the brain was not done. Carotid ultrasound showed less than 50% bilateral carotid stenosis. Transthoracic echo was unremarkable except for positive bubble study indicative of patent foramen ovale. I did not see any results about lipid profile and A1c however the patient was started on aspirin as well as Zocor. Patient was previously on Goody powders but states he is not taking it every day. Patient states she was asked to quit smoking which he has done. He still has significant paresthesias on the right side which is annoying but he says is getting used to it. It is not distressing enough for him to want medication for this. He has noticed diminished fine motor skills in the right hand and difficulty with writing. He denied any associated headache, loss of vision,  speech difficulty or extremity weakness. His noticed that his balance is slightly off since this happened and he has to be careful while walking. He's had no falls or injuries. He denies any prior history of strokes, TIAs, migraines or significant neurological problems. He does have history of degenerative cervical spine disease and had undergone cervical spine fusion by Dr. Venetia Maxon but his had recurrent and persistent symptoms of neck and left arm radicular pain and a recent MRI of the C-spine showed significant degenerative disease at C5-6 with severe left-sided foraminal and at C6-7-6 bilateral foraminal stenosis. Patient was referred to Dr. Venetia Maxon for repeat C-spine surgery which he needs but due to his recent stroke he was referred to me to discuss timing of surgery.Marland Kitchen Update 12/03/2016 : He returns for follow-up after his initial visit with me 2 months ago. He continues to have paresthesias on the right side but feels they're improving. 3 fingers in his hand stay numb and tingle all the time. He has to look at his right hand in order to use it more effectively. When he is distracted he has trouble feeling objects in his hand by counting change. He is starting aspirin well without bruising or bleeding. He remains on Lipitor without any muscle aches or pains but ran out of it a week ago did not collect his refill but plans to do so today. States his blood pressure well controlled and today it is [id number]. He has quit smoking and not restarted it. She  did see Dr. Vickey Huger a few weeks ago and polysomnogram study has been scheduled soon. He remains on gabapentin which is tolerating well and but is not sure whether it's really helping him. He has no new neurological complaints. He is still bothered by neck and left arm radicular pain and plans to see Dr. Venetia Maxon and schedule neck surgery soon. ROS:   14 system review of systems is positive for  numbness, tingling,  ringing in the ears, joint pain, back pain, neck pain,  neck stiffness, snoring and skin moles and all other systems negative  PMH:  Past Medical History:  Diagnosis Date  . Arthritis   . Broken ribs 2005  . Clavicular fracture   . Crohn's disease (HCC)   . DDD (degenerative disc disease), lumbar   . GERD (gastroesophageal reflux disease)   . Hypertension   . Hypothyroidism   . Numbness and tingling    right arm and thighs  . Sinus disease   . Stroke [facility])     Social History:  Social History   Social History  . Marital status: Married    Spouse name: N/A  . Number of children: N/A  . Years of education: N/A   Occupational History  . Not on file.   Social History Main Topics  . Smoking status: Former Smoker    Packs/day: 0.50    Years: 10.00    Quit date: 09/21/2016  . Smokeless tobacco: Never Used  . Alcohol use No  . Drug use: No  . Sexual activity: Not on file   Other Topics Concern  . Not on file   Social History Narrative  . No narrative on file    Medications:   Current Outpatient Prescriptions on File Prior to Visit  Medication Sig Dispense Refill  . esomeprazole (NEXIUM) 40 MG capsule Take 40 mg by mouth.    . gabapentin (NEURONTIN) 300 MG capsule Take 300 mg by mouth 3 (three) times daily.    Marland Kitchen HYDROcodone-acetaminophen (NORCO) 7.5-325 MG per tablet Take 1 tablet by mouth every 6 (six) hours as needed for moderate pain.    . simvastatin (ZOCOR) 40 MG tablet     . tamsulosin (FLOMAX) 0.4 MG CAPS capsule     . tiZANidine (ZANAFLEX) 4 MG tablet Take 8 mg by mouth at bedtime.     No current facility-administered medications on file prior to visit.     Allergies:   Allergies  Allergen Reactions  . Ticarcillin-Pot Clavulanate   . Triamterene Hives    Pt was given IV, and caused a breakout    Physical Exam General: well developed, well nourished Middle aged Caucasian male, seated, in no evident distress Head: head normocephalic and atraumatic.   Neck: supple with no carotid or supraclavicular  bruits Cardiovascular: regular rate and rhythm, no murmurs Musculoskeletal: no deformity Skin:  no rash/petichiae Vascular:  Normal pulses all extremities  Neurologic Exam Mental Status: Awake and fully alert. Oriented to place and time. Recent and remote memory intact. Attention span, concentration and fund of knowledge appropriate. Mood and affect appropriate.  Cranial Nerves: Fundoscopic exam not done Pupils equal, briskly reactive to light. Extraocular movements full without nystagmus. Visual fields full to confrontation. Hearing intact. Facial sensation intact. Face, tongue, palate moves normally and symmetrically.  Motor: Normal bulk and tone. Normal strength in all tested extremity muscles. Sensory.: intact to touch , pinprick , position and vibratory sensation. Subjective paresthesias on the right hemibody including trunk but no objective sensory loss  Coordination: Rapid alternating movements normal in all extremities. Finger-to-nose and heel-to-shin performed accurately bilaterally. Gait and Station: Arises from chair without difficulty. Stance is normal. Gait demonstrates normal stride length and balance . Able to heel, toe and tandem walk with some  difficulty.  Reflexes: 2+ and symmetric. Toes downgoing.   NIHSS  1 Modified Rankin  2  ASSESSMENT: [age] year Caucasian male with right hemibody post stroke paresthesias from left basal ganglia lacunar infarct  In March 2018 due to small vessel disease. Vascular risk factors of hypertension, hyperlipidemia and smoking. History of left acoustic neuroma which is stable and degenerative cervical spine disease with persistent neck and left arm radicular pain    PLAN: I had a long d/w patient and his wife about his recent  stroke,post stroke paresthesias and perioperative risk for stroke with neck surgery  risk for recurrent stroke/TIAs, personally independently reviewed imaging studies and stroke evaluation results and answered  questions.Continue aspirin 325 mg daily  for secondary stroke prevention and maintain strict control of hypertension with blood pressure goal below 130/90, diabetes with hemoglobin A1c goal below 6.5% and lipids with LDL cholesterol goal below 70 mg/dL. I also advised the patient to eat a healthy diet with plenty of whole grains, cereals, fruits and vegetables, exercise regularly and maintain ideal body weight .He does have a PFO but his ROPE score is only 3 points making it 0% chance that stroke is due to PFO .I have complimented him on smoking cessation and advised him to continue to do so. I recommend he postpone his cervical spine surgery by at least 2-3 months after his stroke to recover from his stroke in order to minimize chances of stroke recurrence or worsening of his deficits in the perioperative period .he voiced understanding.He was advised to keep his appointment for polysomnogram for sleep apnea .Greater than 50% time during this 30 minute visit was spent on counseling and coordination of care about his lacunar infarct, post stroke paresthesias, PFO and answering questions. The patient was given information to consider possible participation in the PREMIERS stroke study but he declined Followup in the future with my nurse practitioner in 3 months or call earlier if necessary. Delia HeadyPramod Shikara Mcauliffe, MD  [facility] [address] [place], [place] [id number]  Phone [phone] Fax [phone] Note: This document was prepared with digital dictation and possible smart phrase technology. Any transcriptional errors that result from this process are unintentional.

## 2016-12-03 NOTE — Patient Instructions (Addendum)
I had a long d/w patient and his wife about his recent  stroke,post stroke paresthesias and perioperative risk for stroke with neck surgery  risk for recurrent stroke/TIAs, personally independently reviewed imaging studies and stroke evaluation results and answered questions.Continue aspirin 325 mg daily  for secondary stroke prevention and maintain strict control of hypertension with blood pressure goal below 130/90, diabetes with hemoglobin A1c goal below 6.5% and lipids with LDL cholesterol goal below 70 mg/dL. I also advised the patient to eat a healthy diet with plenty of whole grains, cereals, fruits and vegetables, exercise regularly and maintain ideal body weight .He does have a PFO but his ROPE score is only 3 points making it 0% chance that stroke is due to PFO .I have complimented him on smoking cessation and advised him to continue to do so. I recommend he postpone his cervical spine surgery by at least 2-3 months after his stroke to recover from his stroke in order to minimize chances of stroke recurrence or worsening of his deficits in the perioperative period .he voiced understanding.He was advised to keep his appointment for polysomnogram for sleep apnea Followup in the future with my nurse practitioner in 3 months or call earlier if necessary.

## 2016-12-12 ENCOUNTER — Ambulatory Visit (INDEPENDENT_AMBULATORY_CARE_PROVIDER_SITE_OTHER): Payer: Medicare Other | Admitting: Neurology

## 2016-12-12 DIAGNOSIS — R0683 Snoring: Secondary | ICD-10-CM

## 2016-12-12 DIAGNOSIS — G4734 Idiopathic sleep related nonobstructive alveolar hypoventilation: Secondary | ICD-10-CM

## 2016-12-12 DIAGNOSIS — G4733 Obstructive sleep apnea (adult) (pediatric): Secondary | ICD-10-CM | POA: Diagnosis not present

## 2016-12-12 DIAGNOSIS — I6381 Other cerebral infarction due to occlusion or stenosis of small artery: Secondary | ICD-10-CM

## 2016-12-12 DIAGNOSIS — I69393 Ataxia following cerebral infarction: Secondary | ICD-10-CM

## 2016-12-23 ENCOUNTER — Other Ambulatory Visit: Payer: Self-pay | Admitting: Neurosurgery

## 2017-01-03 DIAGNOSIS — G4733 Obstructive sleep apnea (adult) (pediatric): Secondary | ICD-10-CM | POA: Insufficient documentation

## 2017-01-03 NOTE — Procedures (Signed)
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  Comorbidities are listed as recent stroke, until he suffered a stroke he was a daily smoker. Noted 09-21-2016. Status post cervical fusion, has Crohn's disease, GERD, HTN, hypothyroidism and sinusitis. He uses pain medications, muscle relaxants and Gabapentin.  The patient endorsed the Epworth Sleepiness Scale at 6 points, FSS n/a.   The patient's weight 190 pounds with a height of 69 (inches), resulting in a BMI of 28.1 kg/m2. The patient's neck circumference measured 17 inches.  CURRENT MEDICATIONS: Goody's PM, Nexium, Neurontin, Norco, Zocor, Flomax, Zanaflex   PROCEDURE:  This is a multichannel digital polysomnogram utilizing the Somnostar 11.2 system.  Electrodes and sensors were applied and monitored per AASM Specifications.   EEG, EOG, Chin and Limb EMG, were sampled at 200 Hz.  ECG, Snore and Nasal Pressure, Thermal Airflow, Respiratory Effort, CPAP Flow and Pressure, Oximetry was sampled at 50 Hz. Digital video and audio were recorded.      BASELINE STUDY Lights Out was at 22:02 and Lights On at 04:58.  Total recording time (TRT) was 417 minutes, with a total sleep time (TST) of 389.5 minutes.   The patient's sleep latency was 10.5 minutes.  REM latency was 92 minutes.  The sleep efficiency was 93.4 %.     SLEEP ARCHITECTURE: WASO (Wake after sleep onset) was 14 minutes.  There were 10 minutes in Stage N1, 251.5 minutes Stage N2, 44 minutes Stage N3 and 84 minutes in Stage REM.  The percentage of Stage N1 was 2.6%, Stage N2 was 64.6%, Stage N3 was 11.3% and Stage R (REM sleep) was 21.6%.   RESPIRATORY ANALYSIS:  There were a total of 163 respiratory events:  107 obstructive  apneas, 1 central apnea and 7 mixed apneas with a total of 115 apneas and an apnea index (AI) of 17.7 /hour. There were 48 hypopneas with a hypopnea index of 7.4 /hour. The patient also had 0 respiratory event related arousals (RERAs).    The total APNEA/HYPOPNEA INDEX (AHI) was 25.1/hour and the total RESPIRATORY DISTURBANCE INDEX was 25.1 /hour.  89 events occurred in REM sleep and 58 events in NREM. The REM AHI was 63.6 /hour, versus a non-REM AHI of 14.5. The patient spent 139.5 minutes of total sleep time in the supine position and 250 minutes in non-supine. The supine AHI was 42.6 versus a non-supine AHI of 15.4.  OXYGEN SATURATION & C02:  The Wake baseline 02 saturation was 94%, with the lowest SpO2 being 37%. Time spent below 89% saturation equaled 79 minutes.    PERIODIC LIMB MOVEMENTS:   The patient had a total of 517 Periodic Limb Movements.  The Periodic Limb Movement (PLM) index was 79.6 and the PLM Arousal index was 6.3/hour. The arousals were noted as: 43 were spontaneous, 41 were associated with PLMs, and 76 were associated with respiratory events.  Audio and video analysis did not show any abnormal or unusual movements, behaviors, phonations or vocalizations.   The patient did not take bathroom breaks. Moderate Snoring was noted. Severe PLMs.  EKG was in keeping with normal sinus rhythm (NSR).  IMPRESSION:  1. Severe Obstructive Sleep Apnea (OSA)  with hypoxemia - Capnography was not performed for this patient with recent tobacco use.  2. Severe Periodic Limb Movement Disorder (PLMD) 3. Primary Snoring  RECOMMENDATIONS:  Advise full-night, attended, CPAP titration study to optimize therapy for this stroke patient of Dr Marlis Edelson. I will request to add oxygen should the patient's hypoxemia not respond to CPAP or BiPAP.   1. Avoid sedative-hypnotics which may worsen sleep apnea, alcohol and tobacco (as applicable). 2. Advise patient to avoid driving or operating hazardous  machinery when sleepy. 3. Further information regarding OSA may be obtained from [employer] (www.sleepfoundation.org) or American Sleep Apnea Association (www.sleepapnea.org). 4. A follow up appointment will be scheduled in the Sleep Clinic at [facility]. The referring provider will be notified of the results.      I certify that I have reviewed the entire raw data recording prior to the issuance of this report in accordance with the Standards of Accreditation of the American Academy of Sleep Medicine (AASM)   Melvyn Novas, MD    01-03-2017  Diplomat, American Board of Psychiatry and Neurology  Diplomat, American Board of Sleep Medicine Medical Director of [facility] at [employer]

## 2017-01-03 NOTE — Addendum Note (Signed)
Addended by: Melvyn NovasHMEIER, Marylen Zuk on: 01/03/2017 04:16 PM   Modules accepted: Orders

## 2017-01-05 ENCOUNTER — Telehealth: Payer: Self-pay | Admitting: Neurology

## 2017-01-05 NOTE — Telephone Encounter (Signed)
Pt returned call. Was able to go over the results of the sleep study. Explained the need to come back in as soon as possible for another sleep study that would require cpap/bipap. Pt understood and agreed. Pt mentioned that he had an upcoming surgery and may need to work around that surgery. Explained that the sleep lab will be contacting him to set that up

## 2017-01-05 NOTE — Telephone Encounter (Signed)
-----   Message from Carmen Dohmeier, MD sent at 01/03/2017  4:16 PM EDT ----- Urgent return for CPAP/ BiPAP titration, if needed with oxygen. Order placed Dx:  Moderate severe OSA with hypoxemia and severe PLMs was noted.  The PLMs are likely related to the spine or neck DDD. Copy to Dr Sethi and per RN to Dr Pomposini ( not on epic) . CD 

## 2017-01-05 NOTE — Telephone Encounter (Signed)
-----   Message from Melvyn Novasarmen Dohmeier, MD sent at 01/03/2017  4:16 PM EDT ----- Urgent return for CPAP/ BiPAP titration, if needed with oxygen. Order placed Dx:  Moderate severe OSA with hypoxemia and severe PLMs was noted.  The PLMs are likely related to the spine or neck DDD. Copy to Dr Pearlean BrownieSethi and per RN to Dr Jonelle SportsPomposini ( not on epic) . CD

## 2017-01-05 NOTE — Telephone Encounter (Signed)
Called pt to update on sleep study results. No answer at this time.LVM for pt to return call.

## 2017-01-07 ENCOUNTER — Telehealth: Payer: Self-pay | Admitting: Neurology

## 2017-01-07 NOTE — Progress Notes (Signed)
Result entered in on wrong pt.

## 2017-01-07 NOTE — Telephone Encounter (Signed)
Request for ASAP work in- Please schedule this patient urgently for a BiPAP-PA P return. The patient has upcoming surgery on 01-16-17.

## 2017-01-08 NOTE — Pre-Procedure Instructions (Signed)
Liane ComberRobert Lorah  01/08/2017      [facility] - [place], [place] - [address] [place] [address] Phone: [phone] Fax: [phone]    Your procedure is scheduled on January 16, 2017.  [facility] Admitting at 530 AM.  Call this number if you have problems the morning of surgery:  [phone]   Remember:  Do not eat food or drink liquids after midnight.  Take these medicines the morning of surgery with A SIP OF WATER duloxetine (cymbalta), esomeprazole (nexium), gabapentin (neurontin), tansulosin (flomax), hydrocodone (norco)-if needed for pain.  7 days prior to surgery STOP taking any Aspirin, Aleve, Naproxen, Ibuprofen, Motrin, Advil, Goody's, BC's, all herbal medications, fish oil, and all vitamins   Do not wear jewelry, make-up or nail polish.  Do not wear lotions, powders, or perfumes, or deoderant.  Do not shave 48 hours prior to surgery.  Men may shave face and neck.  Do not bring valuables to the hospital.  [facility] is not responsible for any belongings or valuables.  Contacts, dentures or bridgework may not be worn into surgery.  Leave your suitcase in the car.  After surgery it may be brought to your room.  For patients admitted to the hospital, discharge time will be determined by your treatment team.  Patients discharged the day of surgery will not be allowed to drive home.   Special instructions:   [place]- Preparing For Surgery  Before surgery, you can play an important role. Because skin is not sterile, your skin needs to be as free of germs as possible. You can reduce the number of germs on your skin by washing with CHG (chlorahexidine gluconate) Soap before surgery.  CHG is an antiseptic cleaner which kills germs and bonds with the skin to continue killing germs even after washing.  Please do not use if you have an allergy to CHG or antibacterial soaps. If your skin becomes reddened/irritated  stop using the CHG.  Do not shave (including legs and underarms) for at least 48 hours prior to first CHG shower. It is OK to shave your face.  Please follow these instructions carefully.   1. Shower the NIGHT BEFORE SURGERY and the MORNING OF SURGERY with CHG.   2. If you chose to wash your hair, wash your hair first as usual with your normal shampoo.  3. After you shampoo, rinse your hair and body thoroughly to remove the shampoo.  4. Use CHG as you would any other liquid soap. You can apply CHG directly to the skin and wash gently with a scrungie or a clean washcloth.   5. Apply the CHG Soap to your body ONLY FROM THE NECK DOWN.  Do not use on open wounds or open sores. Avoid contact with your eyes, ears, mouth and genitals (private parts). Wash genitals (private parts) with your normal soap.  6. Wash thoroughly, paying special attention to the area where your surgery will be performed.  7. Thoroughly rinse your body with warm water from the neck down.  8. DO NOT shower/wash with your normal soap after using and rinsing off the CHG Soap.  9. Pat yourself dry with a CLEAN TOWEL.   10. Wear CLEAN PAJAMAS   11. Place CLEAN SHEETS on your bed the night of your first shower and DO NOT SLEEP WITH PETS.    Day of Surgery: Do not apply any deodorants/lotions. Please wear clean clothes to the hospital/surgery  center.     Please read over the following fact sheets that you were given. Pain Booklet, Coughing and Deep Breathing, MRSA Information and Surgical Site Infection Prevention

## 2017-01-09 ENCOUNTER — Encounter (HOSPITAL_COMMUNITY)
Admission: RE | Admit: 2017-01-09 | Discharge: 2017-01-09 | Disposition: A | Discharge status: Home / Self Care | Payer: Medicare Other | Source: Ambulatory Visit | Attending: Neurosurgery | Admitting: Neurosurgery

## 2017-01-09 ENCOUNTER — Encounter (HOSPITAL_COMMUNITY): Payer: Self-pay

## 2017-01-09 DIAGNOSIS — Z01810 Encounter for preprocedural cardiovascular examination: Secondary | ICD-10-CM | POA: Diagnosis not present

## 2017-01-09 DIAGNOSIS — M5412 Radiculopathy, cervical region: Secondary | ICD-10-CM | POA: Diagnosis not present

## 2017-01-09 LAB — BASIC METABOLIC PANEL
Anion gap: 10 (ref 5–15)
BUN: 15 mg/dL (ref 6–20)
CO2: 22 mmol/L (ref 22–32)
CREATININE: 0.91 mg/dL (ref 0.61–1.24)
Calcium: 9.4 mg/dL (ref 8.9–10.3)
Chloride: 108 mmol/L (ref 101–111)
GFR calc Af Amer: >60 mL/min (ref >60)
GLUCOSE: 98 mg/dL (ref 65–99)
POTASSIUM: 4.1 mmol/L (ref 3.5–5.1)
Sodium: 140 mmol/L (ref 135–145)

## 2017-01-09 LAB — CBC
HEMATOCRIT: 42.3 % (ref 39.0–52.0)
Hemoglobin: 14.2 g/dL (ref 13.0–17.0)
MCH: 31.5 pg (ref 26.0–34.0)
MCHC: 33.6 g/dL (ref 30.0–36.0)
MCV: 93.8 fL (ref 78.0–100.0)
Platelets: 259 10*3/uL (ref 150–400)
RBC: 4.51 MIL/uL (ref 4.22–5.81)
RDW: 13.5 % (ref 11.5–15.5)
WBC: 6.4 10*3/uL (ref 4.0–10.5)

## 2017-01-09 LAB — SURGICAL PCR SCREEN
MRSA, PCR: NEGATIVE
STAPHYLOCOCCUS AUREUS: POSITIVE — AB

## 2017-01-09 LAB — TYPE AND SCREEN
ABO/RH(D): O POS
Antibody Screen: NEGATIVE

## 2017-01-09 LAB — ABO/RH: ABO/RH(D): O POS

## 2017-01-09 MED ORDER — CHLORHEXIDINE GLUCONATE CLOTH 2 % EX PADS: 6.0000 | MEDICATED_PAD | Freq: Once | CUTANEOUS | Status: DC

## 2017-01-09 NOTE — Progress Notes (Signed)
ZOX:[id number]:Pomposini, Rande Bruntaniel L, MD  Cardiologist: pt denies  EKG: denies past year  Stress test: pt denies ever  ECHO: 08/2016 in EPIC  Cardiac Cath: pt denies ever  Chest x-ray: denies past year

## 2017-01-12 ENCOUNTER — Ambulatory Visit (INDEPENDENT_AMBULATORY_CARE_PROVIDER_SITE_OTHER): Payer: Medicare Other | Admitting: Neurology

## 2017-01-12 ENCOUNTER — Encounter: Payer: Self-pay | Admitting: Neurology

## 2017-01-12 DIAGNOSIS — G4734 Idiopathic sleep related nonobstructive alveolar hypoventilation: Secondary | ICD-10-CM | POA: Diagnosis not present

## 2017-01-12 DIAGNOSIS — G4733 Obstructive sleep apnea (adult) (pediatric): Secondary | ICD-10-CM

## 2017-01-12 DIAGNOSIS — I10 Essential (primary) hypertension: Secondary | ICD-10-CM

## 2017-01-12 DIAGNOSIS — I6381 Other cerebral infarction due to occlusion or stenosis of small artery: Secondary | ICD-10-CM

## 2017-01-14 ENCOUNTER — Telehealth: Payer: Self-pay | Admitting: Neurology

## 2017-01-14 NOTE — Telephone Encounter (Signed)
Called pt yesterday in regards to some questions he had sent thru mychart. During our conversation pt stated that he has a planned surgery coming up and he may not be able to answer his phone if we call with his titration results. Pt stated he would like to make sure that if we cant get a hold of him first to call his wife Willeen NieceSusan Aloisi [phone]. He gives full permission to discuss the results with her if he is unable to talk.

## 2017-01-15 ENCOUNTER — Encounter (HOSPITAL_COMMUNITY): Payer: Self-pay | Admitting: Anesthesiology

## 2017-01-15 MED ORDER — VANCOMYCIN HCL IN DEXTROSE 1-5 GM/200ML-% IV SOLN
1000.0000 mg | INTRAVENOUS | Status: AC
  Administered 2017-01-16: 1000 mg via INTRAVENOUS
  Filled 2017-01-15: qty 200

## 2017-01-15 NOTE — H&P (Signed)
Patient ID:   [id number] Patient: Sean ComberRobert Poole  Date of Birth: 1955-09-08 Visit Type: Office Visit   Date: 12/18/2016 02:00 PM Provider: Danae OrleansJoseph D. Venetia MaxonStern MD   This 61 year old male presents for neck pain.   History of Present Illness: 1.  neck pain  Patient has a history of ACDF of C4-5 on 05/12/14. He had stroke on 09/21/16. His planned surgery (exploration of fusion C 45 with ACDF C 56 and C 67 levels)  was cancelled as a result. Dr. Marnee SpringStethi agreed to proceed with surgery and managed the patient's pain with Asprin. Patient reports right body numbness related to stroke. He notes no improvement of left arm pain.  Physical: Positive spurling on left. 4/5 left triceps weakness. Exam is unchanged from previous evaluation.         Medical/Surgical/Interim History Reviewed, no change.  Last detailed document date:02/13/2014.     Family History: Reviewed, no changes.  Last detailed document date:02/13/2014.   Social History: Reviewed, no changes. Last detailed document date: 02/13/2014.    MEDICATIONS(added, continued or stopped this visit): Started Medication Directions Instruction Stopped   baclofen 10 mg tablet take 1 tablet by oral route 3 times every day     [employer] Aspirin 325 mg tablet take 1 tablet by oral route  every day     duloxetine 30 mg capsule,delayed release take 1 capsule by oral route  every day     gabapentin 300 mg capsule take 1 capsule by oral route 3 times every day     hydrocodone 7.5 mg-acetaminophen 325 mg tablet take 1 tablet by oral route  every 4 hours as needed for pain     hydrocodone 7.5 mg-acetaminophen 325 mg tablet take 1 tablet by oral route  every 4 hours as needed for pain  12/18/2016   Lortab 7.5-325  BUCCAL 1 by mouth every 6-8 hours as needed  12/18/2016   Nexium 40 mg capsule,delayed release take 1 capsule by oral route  every day     simvastatin 40 mg tablet take 1 tablet by oral route  every day in the evening     tamsulosin 0.4 mg  capsule take 1 capsule by oral route  every day 1/2 hour following the same meal each day       ALLERGIES: Ingredient Reaction Medication Name Comment  TICARCILLIN DISODIUM rash Timentin   POTASSIUM CLAVULANATE rash Timentin    Reviewed, no changes.    Vitals Date Temp F BP Pulse Ht In Wt Lb BMI BSA Pain Score  12/18/2016  118/76 73 70 187.4 26.89  5/10      IMPRESSION The patient presents for re-evaluation for surgery following a cancellation due to stroke. Patient reports no change in left arm pain. Confrontational testing is unchanged from previous evaluation. He is able to proceed with surgery.  Comments:  Patient should stop aspirin prior to surgery  Assessment/Plan # Detail Type Description   1. Assessment Herniated nucleus pulposus, cervical (M50.20).       2. 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       3. Assessment Cervical radiculopathy (M54.12).           Pain Management Plan Pain Scale: 5/10. Method: Numeric Pain Intensity Scale. Location: neck. Pain management follow-up plan of care: Patient taking medication as prescribed..  Fall Risk Plan The patient has not fallen in the last year.  Patient will proceed with exploratory C 4-5 and ACDF C5-6, C6-7. Patient will be re-evaluated for  a new cervical collar.  Orders: Diagnostic Procedures: Assessment Procedure  M54.12 Cervical Spine- Lateral             Provider:  Venetia Maxon MD, Danae Orleans 12/18/2016 3:18 PM  Dictation edited by: Lajoyce Lauber    CC Providers: Georgiann Mccoy [address] [place],  [place]  [address]   Rhea Pink  [address] [place], [place] [address]              Electronically signed by Danae Orleans. Venetia Maxon MD on 12/18/2016 03:56 PM

## 2017-01-15 NOTE — Anesthesia Preprocedure Evaluation (Addendum)
Anesthesia Evaluation  Patient identified by MRN, date of birth, ID band Patient awake    Reviewed: Allergy & Precautions, NPO status , Patient's Chart, lab work & pertinent test results  Airway Mallampati: III  TM Distance: >3 FB Neck ROM: Limited    Dental  (+) Teeth Intact, Dental Advisory Given   Pulmonary sleep apnea , former smoker,    breath sounds clear to auscultation       Cardiovascular hypertension,  Rhythm:Regular Rate:Normal     Neuro/Psych CVA    GI/Hepatic Neg liver ROS, GERD  ,  Endo/Other  Hypothyroidism   Renal/GU negative Renal ROS     Musculoskeletal  (+) Arthritis , Osteoarthritis,    Abdominal   Peds  Hematology negative hematology ROS (+)   Anesthesia Other Findings Day of surgery medications reviewed with the patient.  Reproductive/Obstetrics                            Anesthesia Physical Anesthesia Plan  ASA: II  Anesthesia Plan: General   Post-op Pain Management:    Induction: Intravenous  PONV Risk Score and Plan: 3 and Ondansetron, Dexamethasone, Propofol and Midazolam  Airway Management Planned: Oral ETT and Video Laryngoscope Planned  Additional Equipment:   Intra-op Plan:   Post-operative Plan: Extubation in OR  Informed Consent: I have reviewed the patients History and Physical, chart, labs and discussed the procedure including the risks, benefits and alternatives for the proposed anesthesia with the patient or authorized representative who has indicated his/her understanding and acceptance.   Dental advisory given  Plan Discussed with:   Anesthesia Plan Comments:         Anesthesia Quick Evaluation

## 2017-01-16 ENCOUNTER — Inpatient Hospital Stay (HOSPITAL_COMMUNITY)
Admission: RE | Admit: 2017-01-16 | Discharge: 2017-01-17 | DRG: 473 | Disposition: A | Discharge status: Home / Self Care | Payer: Medicare Other | Source: Ambulatory Visit | Attending: Neurosurgery | Admitting: Neurosurgery

## 2017-01-16 ENCOUNTER — Inpatient Hospital Stay (HOSPITAL_COMMUNITY): Payer: Medicare Other

## 2017-01-16 ENCOUNTER — Inpatient Hospital Stay (HOSPITAL_COMMUNITY): Payer: Medicare Other | Admitting: Certified Registered Nurse Anesthetist

## 2017-01-16 ENCOUNTER — Encounter (HOSPITAL_COMMUNITY)
Admission: RE | Disposition: A | Discharge status: Home / Self Care | Payer: Self-pay | Source: Ambulatory Visit | Attending: Neurosurgery

## 2017-01-16 ENCOUNTER — Encounter (HOSPITAL_COMMUNITY): Payer: Self-pay | Admitting: Urology

## 2017-01-16 DIAGNOSIS — M5011 Cervical disc disorder with radiculopathy,  high cervical region: Secondary | ICD-10-CM | POA: Diagnosis present

## 2017-01-16 DIAGNOSIS — H5712 Ocular pain, left eye: Secondary | ICD-10-CM | POA: Diagnosis not present

## 2017-01-16 DIAGNOSIS — G4733 Obstructive sleep apnea (adult) (pediatric): Secondary | ICD-10-CM | POA: Diagnosis present

## 2017-01-16 DIAGNOSIS — M4722 Other spondylosis with radiculopathy, cervical region: Secondary | ICD-10-CM | POA: Diagnosis present

## 2017-01-16 DIAGNOSIS — E039 Hypothyroidism, unspecified: Secondary | ICD-10-CM | POA: Diagnosis present

## 2017-01-16 DIAGNOSIS — Z7982 Long term (current) use of aspirin: Secondary | ICD-10-CM | POA: Diagnosis not present

## 2017-01-16 DIAGNOSIS — I69393 Ataxia following cerebral infarction: Secondary | ICD-10-CM | POA: Diagnosis not present

## 2017-01-16 DIAGNOSIS — Z888 Allergy status to other drugs, medicaments and biological substances status: Secondary | ICD-10-CM | POA: Diagnosis not present

## 2017-01-16 DIAGNOSIS — M4802 Spinal stenosis, cervical region: Secondary | ICD-10-CM | POA: Diagnosis present

## 2017-01-16 DIAGNOSIS — Z87891 Personal history of nicotine dependence: Secondary | ICD-10-CM

## 2017-01-16 DIAGNOSIS — Z79891 Long term (current) use of opiate analgesic: Secondary | ICD-10-CM

## 2017-01-16 DIAGNOSIS — Z79899 Other long term (current) drug therapy: Secondary | ICD-10-CM | POA: Diagnosis not present

## 2017-01-16 DIAGNOSIS — Z981 Arthrodesis status: Secondary | ICD-10-CM

## 2017-01-16 DIAGNOSIS — I10 Essential (primary) hypertension: Secondary | ICD-10-CM | POA: Diagnosis present

## 2017-01-16 DIAGNOSIS — M5020 Other cervical disc displacement, unspecified cervical region: Secondary | ICD-10-CM | POA: Diagnosis present

## 2017-01-16 DIAGNOSIS — K219 Gastro-esophageal reflux disease without esophagitis: Secondary | ICD-10-CM | POA: Diagnosis present

## 2017-01-16 DIAGNOSIS — Z419 Encounter for procedure for purposes other than remedying health state, unspecified: Secondary | ICD-10-CM

## 2017-01-16 DIAGNOSIS — M5412 Radiculopathy, cervical region: Secondary | ICD-10-CM | POA: Diagnosis present

## 2017-01-16 HISTORY — PX: ANTERIOR CERVICAL DECOMP/DISCECTOMY FUSION: SHX1161

## 2017-01-16 SURGERY — ANTERIOR CERVICAL DECOMPRESSION/DISCECTOMY FUSION 2 LEVEL/HARDWARE REMOVAL
Anesthesia: General

## 2017-01-16 MED ORDER — THROMBIN 5000 UNITS EX SOLR
OROMUCOSAL | Status: DC | PRN
  Administered 2017-01-16: 09:00:00 via TOPICAL

## 2017-01-16 MED ORDER — MIDAZOLAM HCL 5 MG/5ML IJ SOLN
INTRAMUSCULAR | Status: DC | PRN
  Administered 2017-01-16: 2 mg via INTRAVENOUS

## 2017-01-16 MED ORDER — BACLOFEN 10 MG PO TABS
10.0000 mg | ORAL_TABLET | Freq: Every day | ORAL | Status: DC
Start: 2017-01-16 — End: 2017-01-17
  Administered 2017-01-16: 20 mg via ORAL
  Filled 2017-01-16: qty 2

## 2017-01-16 MED ORDER — LIDOCAINE-EPINEPHRINE 1 %-1:100000 IJ SOLN
INTRAMUSCULAR | Status: AC
  Filled 2017-01-16: qty 1

## 2017-01-16 MED ORDER — KCL IN DEXTROSE-NACL 20-5-0.45 MEQ/L-%-% IV SOLN
INTRAVENOUS | Status: DC
  Filled 2017-01-16: qty 1000

## 2017-01-16 MED ORDER — THROMBIN 20000 UNITS EX SOLR
CUTANEOUS | Status: DC | PRN
  Administered 2017-01-16: 20 mL via TOPICAL

## 2017-01-16 MED ORDER — LACTATED RINGERS IV SOLN: INTRAVENOUS | Status: DC

## 2017-01-16 MED ORDER — SUGAMMADEX SODIUM 200 MG/2ML IV SOLN
INTRAVENOUS | Status: DC | PRN
  Administered 2017-01-16: 200 mg via INTRAVENOUS

## 2017-01-16 MED ORDER — PHENOL 1.4 % MT LIQD: 1.0000 | OROMUCOSAL | Status: DC | PRN

## 2017-01-16 MED ORDER — ONDANSETRON HCL 4 MG/2ML IJ SOLN
INTRAMUSCULAR | Status: AC
  Filled 2017-01-16: qty 2

## 2017-01-16 MED ORDER — PROPOFOL 10 MG/ML IV BOLUS
INTRAVENOUS | Status: DC | PRN
  Administered 2017-01-16: 170 mg via INTRAVENOUS

## 2017-01-16 MED ORDER — ACETAMINOPHEN 325 MG PO TABS
650.0000 mg | ORAL_TABLET | ORAL | Status: DC | PRN
  Administered 2017-01-17: 650 mg via ORAL
  Filled 2017-01-16: qty 2

## 2017-01-16 MED ORDER — ERYTHROMYCIN 5 MG/GM OP OINT
TOPICAL_OINTMENT | Freq: Four times a day (QID) | OPHTHALMIC | Status: DC
  Administered 2017-01-16: 19:00:00 via OPHTHALMIC
  Administered 2017-01-16: 1 via OPHTHALMIC
  Filled 2017-01-16: qty 3.5

## 2017-01-16 MED ORDER — HYDROMORPHONE HCL 1 MG/ML IJ SOLN
INTRAMUSCULAR | Status: AC
  Filled 2017-01-16: qty 1

## 2017-01-16 MED ORDER — ZOLPIDEM TARTRATE 5 MG PO TABS: 5.0000 mg | ORAL_TABLET | Freq: Every evening | ORAL | Status: DC | PRN

## 2017-01-16 MED ORDER — ONDANSETRON HCL 4 MG/2ML IJ SOLN
INTRAMUSCULAR | Status: DC | PRN
  Administered 2017-01-16: 4 mg via INTRAVENOUS

## 2017-01-16 MED ORDER — DULOXETINE HCL 30 MG PO CPEP: 30.0000 mg | ORAL_CAPSULE | Freq: Every day | ORAL | Status: DC

## 2017-01-16 MED ORDER — ASPIRIN 325 MG PO TABS
325.0000 mg | ORAL_TABLET | Freq: Every day | ORAL | Status: DC
  Filled 2017-01-16: qty 1

## 2017-01-16 MED ORDER — MIDAZOLAM HCL 2 MG/2ML IJ SOLN
INTRAMUSCULAR | Status: AC
  Filled 2017-01-16: qty 2

## 2017-01-16 MED ORDER — PROMETHAZINE HCL 25 MG/ML IJ SOLN: 6.2500 mg | INTRAMUSCULAR | Status: DC | PRN

## 2017-01-16 MED ORDER — SODIUM CHLORIDE 0.9% FLUSH: 3.0000 mL | Freq: Two times a day (BID) | INTRAVENOUS | Status: DC

## 2017-01-16 MED ORDER — ONDANSETRON HCL 4 MG PO TABS: 4.0000 mg | ORAL_TABLET | Freq: Four times a day (QID) | ORAL | Status: DC | PRN

## 2017-01-16 MED ORDER — PANTOPRAZOLE SODIUM 40 MG IV SOLR: 40.0000 mg | Freq: Every day | INTRAVENOUS | Status: DC

## 2017-01-16 MED ORDER — CEFAZOLIN SODIUM-DEXTROSE 2-4 GM/100ML-% IV SOLN
2.0000 g | Freq: Three times a day (TID) | INTRAVENOUS | Status: AC
  Administered 2017-01-16 (×2): 2 g via INTRAVENOUS
  Filled 2017-01-16 (×2): qty 100

## 2017-01-16 MED ORDER — METHOCARBAMOL 1000 MG/10ML IJ SOLN
500.0000 mg | Freq: Four times a day (QID) | INTRAVENOUS | Status: DC | PRN
  Filled 2017-01-16: qty 5

## 2017-01-16 MED ORDER — SUGAMMADEX SODIUM 200 MG/2ML IV SOLN
INTRAVENOUS | Status: AC
  Filled 2017-01-16: qty 2

## 2017-01-16 MED ORDER — DEXAMETHASONE SODIUM PHOSPHATE 4 MG/ML IJ SOLN
INTRAMUSCULAR | Status: DC | PRN
  Administered 2017-01-16: 10 mg via INTRAVENOUS

## 2017-01-16 MED ORDER — LIDOCAINE-EPINEPHRINE 1 %-1:100000 IJ SOLN
INTRAMUSCULAR | Status: DC | PRN
  Administered 2017-01-16: 3 mL

## 2017-01-16 MED ORDER — SIMVASTATIN 20 MG PO TABS
40.0000 mg | ORAL_TABLET | Freq: Every day | ORAL | Status: DC
  Administered 2017-01-16: 40 mg via ORAL
  Filled 2017-01-16: qty 2

## 2017-01-16 MED ORDER — OXYCODONE HCL 5 MG PO TABS
5.0000 mg | ORAL_TABLET | ORAL | Status: DC | PRN
  Administered 2017-01-16 – 2017-01-17 (×5): 10 mg via ORAL
  Filled 2017-01-16 (×4): qty 2

## 2017-01-16 MED ORDER — FENTANYL CITRATE (PF) 250 MCG/5ML IJ SOLN
INTRAMUSCULAR | Status: AC
  Filled 2017-01-16: qty 5

## 2017-01-16 MED ORDER — GABAPENTIN 300 MG PO CAPS
300.0000 mg | ORAL_CAPSULE | Freq: Three times a day (TID) | ORAL | Status: DC
  Administered 2017-01-16 (×2): 300 mg via ORAL
  Filled 2017-01-16 (×2): qty 1

## 2017-01-16 MED ORDER — BUPIVACAINE HCL (PF) 0.5 % IJ SOLN
INTRAMUSCULAR | Status: AC
  Filled 2017-01-16: qty 30

## 2017-01-16 MED ORDER — PANTOPRAZOLE SODIUM 40 MG PO TBEC: 40.0000 mg | DELAYED_RELEASE_TABLET | Freq: Every day | ORAL | Status: DC

## 2017-01-16 MED ORDER — MORPHINE SULFATE (PF) 4 MG/ML IV SOLN: 2.0000 mg | INTRAVENOUS | Status: DC | PRN

## 2017-01-16 MED ORDER — SENNOSIDES-DOCUSATE SODIUM 8.6-50 MG PO TABS: 1.0000 | ORAL_TABLET | Freq: Every evening | ORAL | Status: DC | PRN

## 2017-01-16 MED ORDER — BUPIVACAINE HCL (PF) 0.5 % IJ SOLN
INTRAMUSCULAR | Status: DC | PRN
  Administered 2017-01-16: 3 mL

## 2017-01-16 MED ORDER — 0.9 % SODIUM CHLORIDE (POUR BTL) OPTIME
TOPICAL | Status: DC | PRN
  Administered 2017-01-16: 1000 mL

## 2017-01-16 MED ORDER — BISACODYL 10 MG RE SUPP
10.0000 mg | Freq: Every day | RECTAL | Status: DC | PRN
Start: 2017-01-16 — End: 2017-01-17

## 2017-01-16 MED ORDER — KETOROLAC TROMETHAMINE 0.5 % OP SOLN
1.0000 [drp] | Freq: Three times a day (TID) | OPHTHALMIC | Status: DC | PRN
  Administered 2017-01-16: 1 [drp] via OPHTHALMIC
  Filled 2017-01-16: qty 3

## 2017-01-16 MED ORDER — BSS IO SOLN
15.0000 mL | INTRAOCULAR | Status: DC | PRN
  Administered 2017-01-16: 15 mL
  Filled 2017-01-16: qty 15

## 2017-01-16 MED ORDER — METHOCARBAMOL 500 MG PO TABS
ORAL_TABLET | ORAL | Status: AC
  Filled 2017-01-16: qty 1

## 2017-01-16 MED ORDER — EPHEDRINE SULFATE-NACL 50-0.9 MG/10ML-% IV SOSY
PREFILLED_SYRINGE | INTRAVENOUS | Status: DC | PRN
  Administered 2017-01-16 (×2): 10 mg via INTRAVENOUS

## 2017-01-16 MED ORDER — MEPERIDINE HCL 25 MG/ML IJ SOLN: 6.2500 mg | INTRAMUSCULAR | Status: DC | PRN

## 2017-01-16 MED ORDER — ONDANSETRON HCL 4 MG/2ML IJ SOLN: 4.0000 mg | Freq: Four times a day (QID) | INTRAMUSCULAR | Status: DC | PRN

## 2017-01-16 MED ORDER — METHOCARBAMOL 500 MG PO TABS
500.0000 mg | ORAL_TABLET | Freq: Four times a day (QID) | ORAL | Status: DC | PRN
Start: 2017-01-16 — End: 2017-01-17
  Administered 2017-01-16 – 2017-01-17 (×2): 500 mg via ORAL
  Filled 2017-01-16 (×3): qty 1

## 2017-01-16 MED ORDER — TAMSULOSIN HCL 0.4 MG PO CAPS: 0.4000 mg | ORAL_CAPSULE | ORAL | Status: DC

## 2017-01-16 MED ORDER — DOCUSATE SODIUM 100 MG PO CAPS
100.0000 mg | ORAL_CAPSULE | Freq: Two times a day (BID) | ORAL | Status: DC
  Administered 2017-01-16 (×2): 100 mg via ORAL
  Filled 2017-01-16 (×2): qty 1

## 2017-01-16 MED ORDER — ROCURONIUM BROMIDE 50 MG/5ML IV SOLN
INTRAVENOUS | Status: AC
  Filled 2017-01-16: qty 1

## 2017-01-16 MED ORDER — HYDROCODONE-ACETAMINOPHEN 7.5-325 MG PO TABS
1.0000 | ORAL_TABLET | Freq: Four times a day (QID) | ORAL | Status: DC
  Administered 2017-01-16 (×3): 1 via ORAL
  Filled 2017-01-16 (×3): qty 1

## 2017-01-16 MED ORDER — ALUM & MAG HYDROXIDE-SIMETH 200-200-20 MG/5ML PO SUSP: 30.0000 mL | Freq: Four times a day (QID) | ORAL | Status: DC | PRN

## 2017-01-16 MED ORDER — PHENYLEPHRINE HCL 10 MG/ML IJ SOLN
INTRAVENOUS | Status: DC | PRN
  Administered 2017-01-16: 50 ug/min via INTRAVENOUS

## 2017-01-16 MED ORDER — FENTANYL CITRATE (PF) 100 MCG/2ML IJ SOLN
INTRAMUSCULAR | Status: DC | PRN
  Administered 2017-01-16 (×3): 50 ug via INTRAVENOUS
  Administered 2017-01-16: 25 ug via INTRAVENOUS

## 2017-01-16 MED ORDER — FLEET ENEMA 7-19 GM/118ML RE ENEM: 1.0000 | ENEMA | Freq: Once | RECTAL | Status: DC | PRN

## 2017-01-16 MED ORDER — LIDOCAINE 2% (20 MG/ML) 5 ML SYRINGE
INTRAMUSCULAR | Status: DC | PRN
  Administered 2017-01-16: 60 mg via INTRAVENOUS

## 2017-01-16 MED ORDER — LACTATED RINGERS IV SOLN
INTRAVENOUS | Status: DC | PRN
  Administered 2017-01-16 (×2): via INTRAVENOUS

## 2017-01-16 MED ORDER — PROPOFOL 10 MG/ML IV BOLUS
INTRAVENOUS | Status: AC
  Filled 2017-01-16: qty 40

## 2017-01-16 MED ORDER — SODIUM CHLORIDE 0.9% FLUSH: 3.0000 mL | INTRAVENOUS | Status: DC | PRN

## 2017-01-16 MED ORDER — MENTHOL 3 MG MT LOZG: 1.0000 | LOZENGE | OROMUCOSAL | Status: DC | PRN

## 2017-01-16 MED ORDER — THROMBIN 5000 UNITS EX SOLR
CUTANEOUS | Status: AC
  Filled 2017-01-16: qty 5000

## 2017-01-16 MED ORDER — ROCURONIUM BROMIDE 10 MG/ML (PF) SYRINGE
PREFILLED_SYRINGE | INTRAVENOUS | Status: DC | PRN
  Administered 2017-01-16: 50 mg via INTRAVENOUS
  Administered 2017-01-16: 20 mg via INTRAVENOUS
  Administered 2017-01-16: 10 mg via INTRAVENOUS
  Administered 2017-01-16: 20 mg via INTRAVENOUS

## 2017-01-16 MED ORDER — ASPIRIN EC 325 MG PO TBEC: 325.0000 mg | DELAYED_RELEASE_TABLET | Freq: Every day | ORAL | Status: DC

## 2017-01-16 MED ORDER — PANTOPRAZOLE SODIUM 40 MG PO TBEC
40.0000 mg | DELAYED_RELEASE_TABLET | Freq: Every day | ORAL | Status: DC
  Administered 2017-01-16: 40 mg via ORAL
  Filled 2017-01-16: qty 1

## 2017-01-16 MED ORDER — OXYCODONE HCL 5 MG PO TABS
ORAL_TABLET | ORAL | Status: AC
  Filled 2017-01-16: qty 2

## 2017-01-16 MED ORDER — HYDROMORPHONE HCL 1 MG/ML IJ SOLN
0.2500 mg | INTRAMUSCULAR | Status: DC | PRN
  Administered 2017-01-16 (×2): 0.5 mg via INTRAVENOUS

## 2017-01-16 MED ORDER — THROMBIN 20000 UNITS EX SOLR
CUTANEOUS | Status: AC
  Filled 2017-01-16: qty 20000

## 2017-01-16 MED ORDER — ACETAMINOPHEN 650 MG RE SUPP
650.0000 mg | RECTAL | Status: DC | PRN
Start: 2017-01-16 — End: 2017-01-17

## 2017-01-16 SURGICAL SUPPLY — 71 items
BASKET BONE COLLECTION (BASKET) IMPLANT
BIT DRILL 14X2.5XNS TI ANT (BIT) ×1 IMPLANT
BIT DRILL AVIATOR 14 (BIT) ×1
BIT DRILL AVIATOR 14MM (BIT) ×1
BIT DRILL NEURO 2X3.1 SFT TUCH (MISCELLANEOUS) ×1 IMPLANT
BIT DRL 14X2.5XNS TI ANT (BIT) ×1
BLADE ULTRA TIP 2M (BLADE) IMPLANT
BNDG GAUZE ELAST 4 BULKY (GAUZE/BANDAGES/DRESSINGS) IMPLANT
BUR BARREL STRAIGHT FLUTE 4.0 (BURR) ×6 IMPLANT
CANISTER SUCT 3000ML PPV (MISCELLANEOUS) ×3 IMPLANT
CARTRIDGE OIL MAESTRO DRILL (MISCELLANEOUS) ×1 IMPLANT
COVER MAYO STAND STRL (DRAPES) ×3 IMPLANT
DECANTER SPIKE VIAL GLASS SM (MISCELLANEOUS) ×3 IMPLANT
DERMABOND ADVANCED (GAUZE/BANDAGES/DRESSINGS) ×2
DERMABOND ADVANCED .7 DNX12 (GAUZE/BANDAGES/DRESSINGS) ×1 IMPLANT
DIFFUSER DRILL AIR PNEUMATIC (MISCELLANEOUS) ×3 IMPLANT
DRAIN JACKSON PRATT 10MM FLAT (MISCELLANEOUS) ×3 IMPLANT
DRAPE HALF SHEET 40X57 (DRAPES) IMPLANT
DRAPE LAPAROTOMY 100X72 PEDS (DRAPES) ×3 IMPLANT
DRAPE MICROSCOPE LEICA (MISCELLANEOUS) ×3 IMPLANT
DRAPE POUCH INSTRU U-SHP 10X18 (DRAPES) ×3 IMPLANT
DRILL NEURO 2X3.1 SOFT TOUCH (MISCELLANEOUS) ×3
DRSG OPSITE POSTOP 3X4 (GAUZE/BANDAGES/DRESSINGS) ×3 IMPLANT
DURAPREP 6ML APPLICATOR 50/CS (WOUND CARE) ×3 IMPLANT
ELECT COATED BLADE 2.86 ST (ELECTRODE) ×3 IMPLANT
ELECT REM PT RETURN 9FT ADLT (ELECTROSURGICAL) ×3
ELECTRODE REM PT RTRN 9FT ADLT (ELECTROSURGICAL) ×1 IMPLANT
EVACUATOR SILICONE 100CC (DRAIN) ×3 IMPLANT
GAUZE SPONGE 4X4 12PLY STRL (GAUZE/BANDAGES/DRESSINGS) IMPLANT
GAUZE SPONGE 4X4 16PLY XRAY LF (GAUZE/BANDAGES/DRESSINGS) IMPLANT
GLOVE BIO SURGEON STRL SZ8 (GLOVE) ×3 IMPLANT
GLOVE BIOGEL PI IND STRL 8 (GLOVE) ×1 IMPLANT
GLOVE BIOGEL PI IND STRL 8.5 (GLOVE) ×1 IMPLANT
GLOVE BIOGEL PI INDICATOR 8 (GLOVE) ×2
GLOVE BIOGEL PI INDICATOR 8.5 (GLOVE) ×2
GLOVE ECLIPSE 8.0 STRL XLNG CF (GLOVE) ×3 IMPLANT
GLOVE EXAM NITRILE LRG STRL (GLOVE) IMPLANT
GLOVE EXAM NITRILE XL STR (GLOVE) IMPLANT
GLOVE EXAM NITRILE XS STR PU (GLOVE) IMPLANT
GOWN STRL REUS W/ TWL LRG LVL3 (GOWN DISPOSABLE) IMPLANT
GOWN STRL REUS W/ TWL XL LVL3 (GOWN DISPOSABLE) IMPLANT
GOWN STRL REUS W/TWL 2XL LVL3 (GOWN DISPOSABLE) IMPLANT
GOWN STRL REUS W/TWL LRG LVL3 (GOWN DISPOSABLE)
GOWN STRL REUS W/TWL XL LVL3 (GOWN DISPOSABLE)
HALTER HD/CHIN CERV TRACTION D (MISCELLANEOUS) ×3 IMPLANT
HEMOSTAT POWDER KIT SURGIFOAM (HEMOSTASIS) IMPLANT
HEMOSTAT POWDER SURGIFOAM 1G (HEMOSTASIS) ×3 IMPLANT
KIT BASIN OR (CUSTOM PROCEDURE TRAY) ×3 IMPLANT
KIT ROOM TURNOVER OR (KITS) ×3 IMPLANT
NEEDLE HYPO 18GX1.5 BLUNT FILL (NEEDLE) ×3 IMPLANT
NEEDLE HYPO 25X1 1.5 SAFETY (NEEDLE) ×3 IMPLANT
NEEDLE SPNL 22GX3.5 QUINCKE BK (NEEDLE) ×3 IMPLANT
NS IRRIG 1000ML POUR BTL (IV SOLUTION) ×3 IMPLANT
OIL CARTRIDGE MAESTRO DRILL (MISCELLANEOUS) ×3
PACK LAMINECTOMY NEURO (CUSTOM PROCEDURE TRAY) ×3 IMPLANT
PAD ARMBOARD 7.5X6 YLW CONV (MISCELLANEOUS) ×3 IMPLANT
PEEK SPACER AVS AS 6X14X16 4D (Cage) ×3 IMPLANT
PEEK SPACER AVS AS 7X14X16X4% (Peek) ×3 IMPLANT
PIN DISTRACTION 14MM (PIN) ×6 IMPLANT
PLATE AVIATOR ASSY 2LVL SZ 34 (Plate) ×3 IMPLANT
RUBBERBAND STERILE (MISCELLANEOUS) ×6 IMPLANT
SCREW AVIAT VAR SLFTAP 4.35X14 (Screw) ×6 IMPLANT
SCREW AVIATOR VAR SELFTAP 4X14 (Screw) ×12 IMPLANT
SPONGE INTESTINAL PEANUT (DISPOSABLE) ×3 IMPLANT
SPONGE SURGIFOAM ABS GEL 100 (HEMOSTASIS) ×3 IMPLANT
STAPLER SKIN PROX WIDE 3.9 (STAPLE) IMPLANT
SUT VIC AB 3-0 SH 8-18 (SUTURE) ×3 IMPLANT
SYR 3ML LL SCALE MARK (SYRINGE) ×3 IMPLANT
TOWEL GREEN STERILE (TOWEL DISPOSABLE) ×3 IMPLANT
TOWEL GREEN STERILE FF (TOWEL DISPOSABLE) ×3 IMPLANT
WATER STERILE IRR 1000ML POUR (IV SOLUTION) ×3 IMPLANT

## 2017-01-16 NOTE — Brief Op Note (Signed)
01/16/2017  10:46 AM  PATIENT:  Sean Poole  61 y.o. male  PRE-OPERATIVE DIAGNOSIS:  Herniated cervical disc, spondylosis, foraminal stenosis, Cervical radiculopathy, cervicalgia   POST-OPERATIVE DIAGNOSIS:  Herniated cervical disc, spondylosis, foraminal stenosis, Cervical radiculopathy, cervicalgia    PROCEDURE:  Procedure(s) with comments: C5-6 C6-7 Anterior cervical decompression/discectomy/fusion, Exploration of fusion at C4-5, and  removal of plate (N/A) - Z6-1 C6-7 Anterior cervical decompression/discectomy/fusion, Exploration of fusion at C4-5,  and  removal of plate  SURGEON:  Surgeon(s) and Role:    Maeola Harman, MD - Primary    * Ditty, Loura Halt, MD  PHYSICIAN ASSISTANT:   ASSISTANTS: Poteat, RN   ANESTHESIA:   general  EBL:  Total I/O In: 1000 [I.V.:1000] Out: 20 [Blood:20]  BLOOD ADMINISTERED:none  DRAINS: none   LOCAL MEDICATIONS USED:  MARCAINE    and LIDOCAINE   SPECIMEN:  No Specimen  DISPOSITION OF SPECIMEN:  N/A  COUNTS:  YES  TOURNIQUET:  * No tourniquets in log *  DICTATION: Patient is 61 year old male with left arm pain and weakness with HNP, spondylosis, radiculopathy C5/6 and C6/7 with prior ACDF C 45 level with cervicalgia.  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  PROCEDURE: Patient was brought to operating room and following the smooth and uncomplicated induction of general endotracheal anesthesia his head was placed on a horseshoe head holder she was placed in 5 pounds of Holter traction and his anterior neck was prepped and draped in usual sterile fashion. An incision was made on the left side of midline after infiltrating the skin and subcutaneous tissues with local lidocaine. The platysmal layer was incised and subplatysmal dissection was performed exposing the anterior border sternocleidomastoid muscle. Using blunt dissection the carotid sheath was kept lateral and trachea and esophagus kept medial exposing the  anterior cervical spine and the previously placed cervical plate at the C 45 level.  Dense scar tissue was carefully dissected and the previously placed plate and screws were removed.  The interfaces of prior fusion were inspected and there appeared to be good incorporation of bone graft without pseudoarthrosis.  A bent spinal needle was placed it was felt to be the C5/6 level and this was confirmed on intraoperative x-ray, although this level and below were poorly visualized. Longus coli muscles were taken down from the anterior cervical spine using electrocautery and key elevator and self-retaining retractor was placed exposing the C5/6 and C6/7 levels. The interspaces were incised and a thorough discectomy was performed. Distraction pins were placed. Initially the C 56 level was operated. Uncinate spurs and central spondylitic ridges were drilled down with a high-speed drill. The spinal cord dura and both C 6 nerve roots were widely decompressed. Hemostasis was assured. After trial sizing a 6 mm peek interbody cage was selected and packed with local autograft. This was tamped into position and countersunk appropriately. Attention was the paid to the C 67 level, where similar decompression was performed.  Uncinate spurs and central spondylitic ridges were drilled down with a high-speed drill. The spinal cord dura and both C 7 nerve roots were widely decompressed. Hemostasis was assured. After trial sizing a 7 mm peek interbody cage was selected and packed with local autograft. This was tamped into position and countersunk appropriately.Distraction weight was removed. A 34 mm Aviator anterior cervical plate was affixed to the cervical spine with 14 mm variable-angle screws 2 at C5, 2 at C6 and 2 at C7. All screws were  well-positioned and locking mechanisms were engaged. Rescue screws were placed at the C 5 level using the previous screw holes.  A final X ray was not obtained because of patient's large habitus. Soft  tissues were inspected and found to be in good repair. The wound was irrigated. A # 10 JP drain was placed through a separate stab incision.  The platysma layer was closed with 3-0 Vicryl stitches and the skin was reapproximated with 3-0 Vicryl subcuticular stitches. The wound was dressed with Dermabond. Counts were correct at the end of the case. Patient was extubated and taken to recovery in stable and satisfactory condition.    PLAN OF CARE: Admit to inpatient   PATIENT DISPOSITION:  PACU - hemodynamically stable.   Delay start of Pharmacological VTE agent (>24hrs) due to surgical blood loss or risk of bleeding: yes

## 2017-01-16 NOTE — Progress Notes (Addendum)
Patient ID: Sean ComberRobert Poole, male   DOB: 05/13/1956, 61 y.o.   MRN: [id number] Alert, conversant, wife present. Notes OS pain since reportedly rubbing his eye in PACU. Toradol drops, antibiotic ointment, and buffered saline drops ordered in PACU & will continue. No visible injury, no report of vision changes, only burning pain left eye.   JP drain patent. Incision without erythema or swelling. Good strength BUE. Ambulated in hall with nurse.

## 2017-01-16 NOTE — Anesthesia Procedure Notes (Signed)
Procedure Name: Intubation Date/Time: 01/16/2017 7:54 AM Performed by: Annabelle HarmanSMITH, Miron Marxen A Pre-anesthesia Checklist: Patient identified, Emergency Drugs available, Suction available and Patient being monitored Patient Re-evaluated:Patient Re-evaluated prior to induction Oxygen Delivery Method: Circle system utilized Preoxygenation: Pre-oxygenation with 100% oxygen Induction Type: IV induction Ventilation: Mask ventilation without difficulty and Oral airway inserted - appropriate to patient size Laryngoscope Size: Glidescope and 4 Grade View: Grade I Tube type: Oral Tube size: 8.0 mm Number of attempts: 1 Airway Equipment and Method: Stylet Placement Confirmation: ETT inserted through vocal cords under direct vision,  positive ETCO2 and breath sounds checked- equal and bilateral Secured at: 23 cm Tube secured with: Tape Dental Injury: Teeth and Oropharynx as per pre-operative assessment

## 2017-01-16 NOTE — Progress Notes (Signed)
Received patient post Sx, alert and oriented, honeycomb dressing dry and intact, with old drainage noted. JP drain,charged draining with serousanguenous fluid in minimal amount. Patient still complaining of discomfort on his left eye, meds given prior to transport on the unit.

## 2017-01-16 NOTE — Op Note (Signed)
01/16/2017  10:46 AM  PATIENT:  Sean Poole  61 y.o. male  PRE-OPERATIVE DIAGNOSIS:  Herniated cervical disc, spondylosis, foraminal stenosis, Cervical radiculopathy, cervicalgia   POST-OPERATIVE DIAGNOSIS:  Herniated cervical disc, spondylosis, foraminal stenosis, Cervical radiculopathy, cervicalgia    PROCEDURE:  Procedure(s) with comments: C5-6 C6-7 Anterior cervical decompression/discectomy/fusion, Exploration of fusion at C4-5, and  removal of plate (N/A) - Z6-1 C6-7 Anterior cervical decompression/discectomy/fusion, Exploration of fusion at C4-5,  and  removal of plate  SURGEON:  Surgeon(s) and Role:    Maeola Harman, MD - Primary    * Ditty, Loura Halt, MD  PHYSICIAN ASSISTANT:   ASSISTANTS: Poteat, RN   ANESTHESIA:   general  EBL:  Total I/O In: 1000 [I.V.:1000] Out: 20 [Blood:20]  BLOOD ADMINISTERED:none  DRAINS: none   LOCAL MEDICATIONS USED:  MARCAINE    and LIDOCAINE   SPECIMEN:  No Specimen  DISPOSITION OF SPECIMEN:  N/A  COUNTS:  YES  TOURNIQUET:  * No tourniquets in log *  DICTATION: Patient is 61 year old male with left arm pain and weakness with HNP, spondylosis, radiculopathy C5/6 and C6/7 with prior ACDF C 45 level with cervicalgia.  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  PROCEDURE: Patient was brought to operating room and following the smooth and uncomplicated induction of general endotracheal anesthesia his head was placed on a horseshoe head holder she was placed in 5 pounds of Holter traction and his anterior neck was prepped and draped in usual sterile fashion. An incision was made on the left side of midline after infiltrating the skin and subcutaneous tissues with local lidocaine. The platysmal layer was incised and subplatysmal dissection was performed exposing the anterior border sternocleidomastoid muscle. Using blunt dissection the carotid sheath was kept lateral and trachea and esophagus kept medial exposing the  anterior cervical spine and the previously placed cervical plate at the C 45 level.  Dense scar tissue was carefully dissected and the previously placed plate and screws were removed.  The interfaces of prior fusion were inspected and there appeared to be good incorporation of bone graft without pseudoarthrosis.  A bent spinal needle was placed it was felt to be the C5/6 level and this was confirmed on intraoperative x-ray, although this level and below were poorly visualized. Longus coli muscles were taken down from the anterior cervical spine using electrocautery and key elevator and self-retaining retractor was placed exposing the C5/6 and C6/7 levels. The interspaces were incised and a thorough discectomy was performed. Distraction pins were placed. Initially the C 56 level was operated. Uncinate spurs and central spondylitic ridges were drilled down with a high-speed drill. The spinal cord dura and both C 6 nerve roots were widely decompressed. Hemostasis was assured. After trial sizing a 6 mm peek interbody cage was selected and packed with local autograft. This was tamped into position and countersunk appropriately. Attention was the paid to the C 67 level, where similar decompression was performed.  Uncinate spurs and central spondylitic ridges were drilled down with a high-speed drill. The spinal cord dura and both C 7 nerve roots were widely decompressed. Hemostasis was assured. After trial sizing a 7 mm peek interbody cage was selected and packed with local autograft. This was tamped into position and countersunk appropriately.Distraction weight was removed. A 34 mm Aviator anterior cervical plate was affixed to the cervical spine with 14 mm variable-angle screws 2 at C5, 2 at C6 and 2 at C7. All screws were  well-positioned and locking mechanisms were engaged. Rescue screws were placed at the C 5 level using the previous screw holes.  A final X ray was not obtained because of patient's large habitus. Soft  tissues were inspected and found to be in good repair. The wound was irrigated. A # 10 JP drain was placed through a separate stab incision.  The platysma layer was closed with 3-0 Vicryl stitches and the skin was reapproximated with 3-0 Vicryl subcuticular stitches. The wound was dressed with Dermabond. Counts were correct at the end of the case. Patient was extubated and taken to recovery in stable and satisfactory condition.    PLAN OF CARE: Admit to inpatient   PATIENT DISPOSITION:  PACU - hemodynamically stable.   Delay start of Pharmacological VTE agent (>24hrs) due to surgical blood loss or risk of bleeding: yes

## 2017-01-16 NOTE — Transfer of Care (Signed)
Immediate Anesthesia Transfer of Care Note  Patient: Sean ComberRobert Sadowsky  Procedure(s) Performed: Procedure(s) with comments: C5-6 C6-7 Anterior cervical decompression/discectomy/fusion, Exploration of fusion at C4-5, and  removal of plate (N/A) - Z6-1C5-6 C6-7 Anterior cervical decompression/discectomy/fusion, Exploration of fusion at C4-5,  and  removal of plate  Patient Location: PACU  Anesthesia Type:General  Level of Consciousness: awake, alert , oriented and patient cooperative  Airway & Oxygen Therapy: Patient Spontanous Breathing and Patient connected to nasal cannula oxygen  Post-op Assessment: Report given to RN, Post -op Vital signs reviewed and stable and Patient moving all extremities X 4  Post vital signs: Reviewed and stable  Last Vitals:  Vitals:   01/16/17 0605 01/16/17 1050  BP: (!) 155/89 (!) (P) 148/96  Pulse: 72   Resp: 20   Temp: 36.8 C (P) 36.8 C    Last Pain:  Vitals:   01/16/17 0605  TempSrc: Oral  PainSc:          Complications: No apparent anesthesia complications

## 2017-01-16 NOTE — Anesthesia Postprocedure Evaluation (Signed)
Anesthesia Post Note  Patient: Sean ComberRobert Petzold  Procedure(s) Performed: Procedure(s) (LRB): C5-6 C6-7 Anterior cervical decompression/discectomy/fusion, Exploration of fusion at C4-5, and  removal of plate (N/A)     Patient location during evaluation: PACU Anesthesia Type: General Level of consciousness: awake and alert Pain management: pain level controlled Vital Signs Assessment: post-procedure vital signs reviewed and stable Respiratory status: spontaneous breathing, nonlabored ventilation, respiratory function stable and patient connected to nasal cannula oxygen Cardiovascular status: blood pressure returned to baseline and stable Postop Assessment: no signs of nausea or vomiting Anesthetic complications: no    Last Vitals:  Vitals:   01/16/17 1358 01/16/17 1726  BP: 138/89 132/83  Pulse: 95 90  Resp: 16 16  Temp: 36.5 C (!) 36.4 C    Last Pain:  Vitals:   01/16/17 1754  TempSrc:   PainSc: 3                  Shelton SilvasKevin D Numa Heatwole

## 2017-01-16 NOTE — Interval H&P Note (Signed)
History and Physical Interval Note:  01/16/2017 7:30 AM  Sean Comberobert Overby  has presented today for surgery, with the diagnosis of Cervical radiculopathy  The various methods of treatment have been discussed with the patient and family. After consideration of risks, benefits and other options for treatment, the patient has consented to  Procedure(s) with comments: C5-6 C6-7 Anterior cervical decompression/discectomy/fusion, Exploration of fusion at C4-5, possible removal of plate (N/A) - Z6-1C5-6 C6-7 Anterior cervical decompression/discectomy/fusion, Exploration of fusion at C4-5, possible removal of plate as a surgical intervention .  The patient's history has been reviewed, patient examined, no change in status, stable for surgery.  I have reviewed the patient's chart and labs.  Questions were answered to the patient's satisfaction.     Sean Poole D

## 2017-01-16 NOTE — Evaluation (Signed)
Physical Therapy Evaluation and D/C Patient Details Name: Sean Poole MRN: [id number] DOB: [birth year] Today's Date: 01/16/2017   History of Present Illness  Pt admit for C5-6 C6-7 Anterior cervical decompression/discectomy/fusion, Exploration of fusion at C4-5, and  removal of plate.  PMH:  Smoker, sleep apnea, arthritis  Clinical Impression  Pt admitted with above diagnosis. Pt currently without significant functional limitations and is supervision and states he is at baseline.  All education completed regarding cervical precautions.  Pt was able to place underwear on and manipulate socks.  Do not feel that OT is warranted.  Pt and wife agree that all questions answered.  Will sign off.   Follow Up Recommendations No PT follow up    Equipment Recommendations  None recommended by PT    Recommendations for Other Services       Precautions / Restrictions Precautions Precautions: Cervical Required Braces or Orthoses: Cervical Brace Cervical Brace: Soft collar;At all times Restrictions Weight Bearing Restrictions: No      Mobility  Bed Mobility Overal bed mobility: Independent                Transfers Overall transfer level: Independent                  Ambulation/Gait Ambulation/Gait assistance: Supervision Ambulation Distance (Feet): 250 Feet Assistive device: None Gait Pattern/deviations: Step-through pattern;Decreased stride length   Gait velocity interpretation: <1.8 ft/sec, indicative of risk for recurrent falls General Gait Details: No issues with ambulation and can accept min challenges to balance without LOB. Pt states he is at baseline for ambulation.     Stairs            Wheelchair Mobility    Modified Rankin (Stroke Patients Only)       Balance Overall balance assessment: No apparent balance deficits (not formally assessed)                                           Pertinent Vitals/Pain Pain Assessment:  0-10 Pain Score: 5  Pain Location: neck Pain Descriptors / Indicators: Aching;Grimacing;Guarding;Operative site guarding Pain Intervention(s): Limited activity within patient's tolerance;Monitored during session;Repositioned;Premedicated before session    Home Living Family/patient expects to be discharged to:: Private residence Living Arrangements: Spouse/significant other Available Help at Discharge: Family;Available 24 hours/day Type of Home: House Home Access: Level entry     Home Layout: One level Home Equipment: None      Prior Function Level of Independence: Independent               Hand Dominance        Extremity/Trunk Assessment   Upper Extremity Assessment Upper Extremity Assessment: Overall WFL for tasks assessed    Lower Extremity Assessment Lower Extremity Assessment: Overall WFL for tasks assessed    Cervical / Trunk Assessment Cervical / Trunk Assessment: Normal  Communication   Communication: No difficulties  Cognition Arousal/Alertness: Awake/alert Behavior During Therapy: WFL for tasks assessed/performed Overall Cognitive Status: Within Functional Limits for tasks assessed                                        General Comments General comments (skin integrity, edema, etc.): Discussed handout regarding cervical precautions with pt and wife.     Exercises     Assessment/Plan  PT Assessment Patent does not need any further PT services  PT Problem List         PT Treatment Interventions      PT Goals (Current goals can be found in the Care Plan section)  Acute Rehab PT Goals Patient Stated Goal: to go home PT Goal Formulation: All assessment and education complete, DC therapy    Frequency     Barriers to discharge        Co-evaluation               AM-PAC PT "6 Clicks" Daily Activity  Outcome Measure Difficulty turning over in bed (including adjusting bedclothes, sheets and blankets)?:  None Difficulty moving from lying on back to sitting on the side of the bed? : None Difficulty sitting down on and standing up from a chair with arms (e.g., wheelchair, bedside commode, etc,.)?: None Help needed moving to and from a bed to chair (including a wheelchair)?: None Help needed walking in hospital room?: None Help needed climbing 3-5 steps with a railing? : None 6 Click Score: 24    End of Session Equipment Utilized During Treatment: Gait belt;Cervical collar Activity Tolerance: Patient tolerated treatment well Patient left: in bed;with call bell/phone within reach;with family/visitor present Nurse Communication: Mobility status PT Visit Diagnosis: Muscle weakness (generalized) (M62.81)    Time: [id number] PT Time Calculation (min) (ACUTE ONLY): 21 min   Charges:   PT Evaluation $PT Eval Low Complexity: 1 Procedure     PT G Codes:        Sean Poole,PT Acute Rehabilitation [phone] (pager)   [name] 01/16/2017, 4:42 PM

## 2017-01-17 MED ORDER — OXYCODONE HCL 5 MG PO TABS: 5.0000 mg | ORAL_TABLET | ORAL | 0 refills | Status: DC | PRN

## 2017-01-17 MED ORDER — METHOCARBAMOL 500 MG PO TABS: 500.0000 mg | ORAL_TABLET | Freq: Four times a day (QID) | ORAL | 1 refills | Status: DC | PRN

## 2017-01-17 NOTE — Discharge Summary (Signed)
Physician Discharge Summary  Patient ID: Sean ComberRobert Poole MRN: [id number] DOB/AGE: 11-18-1955 61 y.o.  Admit date: 01/16/2017 Discharge date: 01/17/2017  Admission Diagnoses:Herniated cervical disc with stenosis and radiculopathy, recent CVA  Discharge Diagnoses: Same Active Problems:   Herniated cervical disc without myelopathy   Discharged Condition: good  Hospital Course: Patient underwent exploration of fusion C 45 with anterior cervical decompression and fusion C 56 and C 67 levels.  He did well postoperatively, apart from some left eye pain, which resolved.  Radiculopathy is improved.  Patient has some hoarseness, but wound is flat and he has no dysphagia.  Baseline stroke symptoms are stable.  Consults: None  Significant Diagnostic Studies: None  Treatments: surgery: exploration of fusion C 45 with anterior cervical decompression and fusion C 56 and C 67 levels  Discharge Exam: Blood pressure 136/78, pulse 72, temperature 97.6 F (36.4 C), temperature source Oral, resp. rate 18, SpO2 98 %. Neurologic: Alert and oriented X 3, normal strength and tone. Normal symmetric reflexes. Normal coordination and gait Wound CDI  Disposition: Home  Discharge Instructions    Diet - low sodium heart healthy    Complete by:  As directed    Increase activity slowly    Complete by:  As directed      Allergies as of 01/17/2017      Reactions   Ticarcillin-pot Clavulanate Rash   Timentin   Triamterene Hives   Pt was given IV, and caused a breakout      Medication List    TAKE these medications   aspirin 325 MG tablet Take 325 mg by mouth daily.   baclofen 10 MG tablet Commonly known as:  LIORESAL Take 10-20 mg by mouth at bedtime.   DULoxetine 30 MG capsule Commonly known as:  CYMBALTA Take 30 mg by mouth daily.   gabapentin 300 MG capsule Commonly known as:  NEURONTIN Take 300 mg by mouth 3 (three) times daily.   HYDROcodone-acetaminophen 7.5-325 MG tablet Commonly known  as:  NORCO Take 1 tablet by mouth 4 (four) times daily.   methocarbamol 500 MG tablet Commonly known as:  ROBAXIN Take 1 tablet (500 mg total) by mouth every 6 (six) hours as needed for muscle spasms.   NEXIUM 40 MG capsule Generic drug:  esomeprazole Take 40 mg by mouth daily.   oxyCODONE 5 MG immediate release tablet Commonly known as:  Oxy IR/ROXICODONE Take 1-2 tablets (5-10 mg total) by mouth every 3 (three) hours as needed for breakthrough pain.   simvastatin 40 MG tablet Commonly known as:  ZOCOR Take 40 mg by mouth daily.   tamsulosin 0.4 MG Caps capsule Commonly known as:  FLOMAX Take 0.4 mg by mouth See admin instructions. Every 3-4 days        Signed: Dorian HeckleSTERN,Shanyia Stines D, MD 01/17/2017, 6:46 AM

## 2017-01-17 NOTE — Discharge Instructions (Signed)

## 2017-01-17 NOTE — Progress Notes (Signed)
OT Cancellation Note  Patient Details Name: Sean ComberRobert Rissler MRN: [id number] DOB: March 06, 1956   Cancelled Treatment:    Reason Eval/Treat Not Completed: OT screened, no needs identified, will sign off. Pt with good understanding of ADL strategies and back precautions. No OT needs identified and will sign off.   Doristine Sectionharity A Josclyn Rosales, MS OTR/L  Pager: [phone]   Doristine SectionCharity A Zahria Ding 01/17/2017, 7:35 AM

## 2017-01-17 NOTE — Progress Notes (Signed)
Subjective: Patient reports doing well  Objective: Vital signs in last 24 hours: Temp:  [97.5 F (36.4 C)-98.4 F (36.9 C)] 97.6 F (36.4 C) (07/21 0315) Pulse Rate:  [72-99] 72 (07/21 0315) Resp:  [9-20] 18 (07/21 0315) BP: (125-148)/(73-96) 136/78 (07/21 0315) SpO2:  [91 %-98 %] 98 % (07/21 0315)  Intake/Output from previous day: 07/20 0730 - 07/21 0729 In: 2140 [P.O.:840; I.V.:1300] Out: 480 [Urine:400; Drains:60; Blood:20] Intake/Output this shift: Total I/O In: 2140 [P.O.:840; I.V.:1300] Out: 480 [Urine:400; Drains:60; Blood:20]  Physical Exam: Strength full, left arm pain better, voice hoarse, but comes and goes.  Dressing CDI  Lab Results: No results for input(s): WBC, HGB, HCT, PLT in the last 72 hours. BMET No results for input(s): NA, K, CL, CO2, GLUCOSE, BUN, CREATININE, CALCIUM in the last 72 hours.  Studies/Results: Dg Cervical Spine 1 View  Result Date: 01/16/2017 CLINICAL DATA:  ACDF C4-C5 EXAM: CERVICAL SPINE 1 VIEW COMPARISON:  Cervical spine radiographs - 09/03/2016; 12/07/2014 FINDINGS: A single spot lateral projection intraoperative radiographic image of the cervical spine is provided for review. C1 to the superior endplate of C5 is imaged. There is obscuration of the lower cervical spine as well as the cervicothoracic junction secondary overlying osseous and soft tissue structures. There has been apparent removal of previously noted C4-C5 ACDF hardware. A marking instrument projects over the soft tissues anterior to the C5 vertebral body. Endotracheal tube tip is excluded from view. IMPRESSION: 1. Intraoperative localization of C5 as above. 2. Removal of previously noted C4-C5 ACDF hardware. Electronically Signed   By: Simonne ComeJohn  Watts M.Poole.   On: 01/16/2017 11:00    Assessment/Plan: Doing well POD 1.  Discharge home.    LOS: 1 day    Sean Poole,Sean Watrous D, MD 01/17/2017, 6:45 AM

## 2017-01-17 NOTE — Progress Notes (Signed)
Patient is discharged from room 3C09 at this time. Alert and in stable condition. IV site d/c'd and instructions read to patient and wife with understanding verbalized. Ambulate out of unit with all belongings at side.

## 2017-01-18 NOTE — Procedures (Signed)
PATIENT'S NAME:  Sean Poole, Sean Poole DOB:      [birth year]      MR#:    [id number]     DATE OF RECORDING: 01/12/2017 REFERRING M.D.:  Georgiann Mccoy, MD Study Performed:   CPAP  Titration HISTORY:  This Patient returned after a previous study from 12/12/16 which resulted in total APNEA/HYPOPNEA INDEX (AHI) of 25.1/hour and REM AHI was 63.6/hour. The supine AHI was 42.6/hr. versus a non-supine AHI of 15.4. SpO2 being 37% at nadir. Time spent below 89% saturation equaled 79 minutes.  The patient endorsed the Epworth Sleepiness Scale at 6/24 points.   The patient's weight 190 pounds with a height of 69 (inches), resulting in a BMI of 28.1 kg/m2. The patient's neck circumference measured 17 inches. CURRENT MEDICATIONS: Goody's PM, Nexium, Neurontin, Norco, Zocor, Flomax, Zanaflex  PROCEDURE:  This is a multichannel digital polysomnogram utilizing the SomnoStar 11.2 system.  Electrodes and sensors were applied and monitored per AASM Specifications.   EEG, EOG, Chin and Limb EMG, were sampled at 200 Hz.  ECG, Snore and Nasal Pressure, Thermal Airflow, Respiratory Effort, CPAP Flow and Pressure, Oximetry was sampled at 50 Hz. Digital video and audio were recorded.      CPAP was initiated at 5 cmH20 with heated humidity per AASM split night standards and pressure was advanced to 12 cmH20 because of hypopneas, apneas and desaturations.  At a PAP pressure of 12 cmH20, there was a reduction of the AHI to 0.0, the SpO2 nadir rose to 92%, snoring was eliminated  with the use of a nasal pillow interface, AirFit P10 in medium.  Lights Out was at 21:00 and Lights On at 05:29. Total recording time (TRT) was 509 minutes, with a total sleep time (TST) of 361 minutes. The patient's sleep latency was 54.5 minutes with 7 minutes of wake time after sleep onset. REM latency was 84.5 minutes.  The sleep efficiency was 70.9 %.    SLEEP ARCHITECTURE: WASO (Wake after sleep onset)  was 86.5 minutes.  There were 12.5 minutes in  Stage N1, 284.5 minutes Stage N2, 5 minutes Stage N3 and 59 minutes in Stage REM.  The percentage of Stage N1 was 3.5%, Stage N2 was 78.8%, Stage N3 was 1.4% and Stage R (REM sleep) was 16.3%.   RESPIRATORY ANALYSIS:  There was a total of 17 respiratory events: 0 obstructive apneas, 2 central apneas and 0 mixed apneas with a total of 2 apneas and an apnea index (AI) of .3 /hour. There were 15 hypopneas with a hypopnea index of 2.5/hour. The patient also had 1 respiratory event related arousals (RERAs).     The total APNEA/HYPOPNEA INDEX  (AHI) was 2.8 /hour and the total RESPIRATORY DISTURBANCE INDEX was 3.0 /hr. 9 events occurred in REM sleep and 8 events in NREM. The REM AHI was 9.2 /hour versus a non-REM AHI of 1.6 /hour.  The patient spent 180 minutes of total sleep time in the supine position and 181 minutes in non-supine. The supine AHI was 5.7, versus a non-supine AHI of 0.0.  OXYGEN SATURATION & C02:  The baseline 02 saturation was 92%, with the lowest being 84%. Time spent below 89% saturation equaled 1 minute.  PERIODIC LIMB MOVEMENTS:   The patient had a total of 604 Periodic Limb Movements. The Periodic Limb Movement (PLM) index was 100.4 and the PLM Arousal index was 16.8 /hour. The arousals were noted as: 7 were spontaneous, 101 were associated with PLMs, and only 19 were associated with respiratory  events.  Audio and video analysis did not show any abnormal behaviors, phonations or vocalizations.  The patient took no bathroom breaks. Snoring was noted under the lowest of pressure settings and alleviated at final pressure.  EKG was in keeping with normal sinus rhythm (NSR). Post-study, the patient indicated that sleep was worse than usual.   DIAGNOSIS 1. Obstructive Sleep Apnea with hypoxemia was responsive to CPAP at 12 cm water pressure with 1 cm EPR or air-sense function and heated humidity. The patient was fitted with a ResMed AirFit P10 (Med) apparatus. 2. 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 A primary work up will be needed, possible medication therapy initiated.     PLANS/RECOMMENDATIONS: begin CPAP at 12 cm water, 1 cm EPR , using an auto-titration capable model with heated humidity. The patient was fitted with a ResMed AirFit P10 (Med) apparatus.  1. CPAP clinic follow up in 2-3 months after receiving device; and thereafter, yearly CPAP clinic follow-up is advisable. 2. PLMs were unrelated to apnea: A primary work up will be needed, possible medication therapy initiated.    A follow up appointment [facility].   Please call [phone] with any questions.     I certify that I have reviewed the entire raw data recording prior to the issuance of this report in accordance with the Standards of Accreditation of the American Academy of Sleep Medicine (AASM)    Melvyn Novasarmen Marks Scalera, M.D.    01-18-2017  Diplomat, American Board of Psychiatry and Neurology  Diplomat, American Board of Sleep Medicine Medical Director, [facility]

## 2017-01-18 NOTE — Addendum Note (Signed)
Addended by: Melvyn NovasHMEIER, Urias Sheek on: 01/18/2017 02:29 PM   Modules accepted: Orders

## 2017-01-19 ENCOUNTER — Encounter (HOSPITAL_COMMUNITY): Payer: Self-pay | Admitting: Neurosurgery

## 2017-01-20 ENCOUNTER — Telehealth: Payer: Self-pay | Admitting: Neurology

## 2017-01-20 NOTE — Telephone Encounter (Signed)
I called pt. I advised pt that Dr. Vickey Hugerohmeier reviewed their sleep study results and found that pt did have OSA treated well with CPAP. Dr. Vickey Hugerohmeier recommends that pt start CPAP. I reviewed PAP compliance expectations with the pt. Pt is agreeable to starting a CPAP. I advised pt that an order will be sent to a DME, [facility], and [facility] will call the pt within about one week after they file with the pt's insurance. [facility] will show the pt how to use the machine, fit for masks, and troubleshoot the CPAP if needed. A follow up appt was made for insurance purposes with Dr. Vickey Hugerohmeier on Monday Sept 24th 2018 at 10:00am. Pt verbalized understanding to arrive 15 minutes early and bring their CPAP. A letter with all of this information in it will be mailed to the pt as a reminder. I verified with the pt that the address we have on file is correct. Pt verbalized understanding of results. Pt had no questions at this time but was encouraged to call back if questions arise.

## 2017-03-19 ENCOUNTER — Telehealth: Payer: Self-pay | Admitting: Neurology

## 2017-03-19 NOTE — Telephone Encounter (Signed)
I noticed where patient had called and cancelled his apt for Mon 9/24 and rescheduled for Jan. This apt was for his CPAP follow up and needs to be seen with in 60-90 days of when he started his CPAP machine.  The patient can follow up with Darrol Angel NP or Dr Vickey Huger but would need to be seen by Apr 22 2017. Pt is a medicare and if he is not seen by this date then the whole process of the CPAP machine will have be started over. I called patient to see if I could reschedule his apt sooner and there was no answer. LVM expressing the importance of this and asked if the patient would call back. I made aware on voicemail that I will not be in the office until Monday.

## 2017-03-23 ENCOUNTER — Ambulatory Visit: Payer: Self-pay | Admitting: Neurology

## 2017-03-26 ENCOUNTER — Encounter: Payer: Self-pay | Admitting: Neurology

## 2017-03-26 ENCOUNTER — Ambulatory Visit (INDEPENDENT_AMBULATORY_CARE_PROVIDER_SITE_OTHER): Payer: Medicare Other | Admitting: Neurology

## 2017-03-26 VITALS — BP 126/80 | HR 79 | Ht 69.0 in | Wt 189.0 lb

## 2017-03-26 DIAGNOSIS — I639 Cerebral infarction, unspecified: Secondary | ICD-10-CM

## 2017-03-26 DIAGNOSIS — G4761 Periodic limb movement disorder: Secondary | ICD-10-CM

## 2017-03-26 DIAGNOSIS — G4733 Obstructive sleep apnea (adult) (pediatric): Secondary | ICD-10-CM

## 2017-03-26 DIAGNOSIS — Z9989 Dependence on other enabling machines and devices: Secondary | ICD-10-CM

## 2017-03-26 NOTE — Progress Notes (Signed)
SLEEP MEDICINE CLINIC   Provider:  Melvyn Novas, [place]  Primary Care Physician:  Jonelle Sports Rande Brunt, MD   Referring Provider:   Dr. Pearlean Brownie, MD    Chief Complaint  Patient presents with  . Follow-up    pt alone, rm 10. pt states that he would sleep better prior to CPAP. He states that he knocks it off in the middle of the night.     HPI:  Sean Poole is a 61 y.o. male , seen here as in a referral from Dr. Pearlean Brownie ,   Chief complaint according to patient : I have the pleasure of meeting with Sean Poole today on 03/26/2017 following to sleep studies. His first study was on 12/12/2016 and diagnosed the patient was a moderate severe sleep apnea at 25.1 per hour, but during REM sleep exacerbated to 63.6 per hour there was also supine accentuation to an AHI of 42.6 per hour. There were very frequent limb movements per hour of sleep the patient kicked or had myoclonic jerking movements 79 times. Moderate snoring was noted but the patient's heart remained in normal sinus rhythm. He slept for 93% of the time. So the study was followed by a CPAP titration study from 01/12/2017 and the patient responded very well to CPAP at 12 cm water pressure was 1 cm EPR he used a nasal pillow apparatus but his periodic limb movements kicking and jerking movements persisted while under CPAP therapy he may need medication for this. Sean Poole does not feel more refreshed and restored now that she uses CPAP, his compliance is good for 93% of the last 30 days but over the last 12 of September he has not been able to use it every night for 4 hours or more. The average user time is 5 hours 21 minutes CPAP was set at 12 cm water was 1 cm EPR, his residual AHI is only 2.1 which is a good resolution of apnea. He consistently felt fatigued, FSS was endorsed at 30 points, Epworth sleepiness score was endorsed at 6 points. He feels he dreams less- sleeps less sound.  He would like to try auto-titration 5-12 cm water, 3 cm EPR and  a mask allowing prone sleep. He often lost the nasal pillow - could try a wisp? His wife never mentioned PLMs- no kicking known- he finds the PLMs remarkable.       Consult _ CD Sleep habits are as follows: The patient's current bedtime is between 9 PM and 9:30 PM and he describes himself as going promptly to sleep. He is a prone sleeper, and uses one pillow. He usually has a bathroom break at 5 AM but until that seems to sleep through, he can go back to sleep and rises finally around 7:30 AM. He complains neither dry mouth nor headaches, he is not nauseated does not have palpitations in the morning, there is no diaphoresis either. Sleep has not been a problem for him. His wife reports that he snores but she has not witnessed him to have apneic pauses, he is not restless, is no evidence of periodic limb movements, dream enactments.   Sleep medical history and family sleep history: see stroke note 09-21-2016, lacune  Left brain. Spine DDD, bone spurs. No family history of apnea, OSA, not having sleepwalked.  The patient has cervical bilateral foraminal stenosis and has a history of C-spine surgery. He suffered rib fractures in the past clavicular fracture, has been diagnosed with Crohn's disease, gastroesophageal reflux disease,  hypertension, hypothyroidism, numbness and tingling, sinus disease and most recently stroke. 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 Irregular work hours, for 40 years. The patient quit smoking at the time of his stroke, then half a pack per day smoker. He has not been drinking alcohol for 12 years. He took caffeine sometimes in pill form, sometimes it from of  [facility] or coffee.    DR Pearlean Brownie 's note ; Reason for Referral:  Stroke  HPI: Sean Poole is a 61 year old Caucasian male who is accompanied today by his wife. History is obtained from the patient and wife as well as review of medical records and imaging studies sent with him which  I have personally reviewed. He states he had a usual day until last Saturday when he went to sleep. When he woke up on 09/21/16 he noticed some tingling and numbness involving his right lip, a few fingers in his right hand as well as a few toes on the right. As the day went on his numbness progressed to involve the whole right side. He went to [facility] where he was hospitalized.  CT scan of the head on admission was unremarkable. MRI scan of the brain showed a small left basal ganglia lacunar infarct. 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 MRA of the brain was not done. Carotid ultrasound showed less than 50% bilateral carotid stenosis. Transthoracic echo was unremarkable except for positive bubble study indicative of patent foramen ovale. I did not see any results about lipid profile and A1c however the patient was started on aspirin as well as Zocor. Patient was previously on Goody powders but states he is not taking it every day. Patient states she was asked to quit smoking which he has done. He still has significant paresthesias on the right side which is annoying but he says is getting used to it. It is not distressing enough for him to want medication for this. He has noticed diminished fine motor skills in the right hand and difficulty with writing. He denied any associated headache, loss of vision, speech difficulty or extremity weakness. His noticed that his balance is slightly off since this happened and he has to be careful while walking. He's had no falls or injuries. He denies any prior history of strokes, TIAs, migraines or significant neurological problems. He does have history of degenerative cervical spine disease and had undergone cervical spine fusion by Dr. Venetia Maxon but his had recurrent and persistent symptoms of neck and left arm radicular pain and a recent MRI of the C-spine  showed significant degenerative disease at C5-6 with severe left-sided foraminal and at C6-7-6 bilateral foraminal stenosis. Patient was referred to Dr. Venetia Maxon for repeat C-spine surgery which he needs but due to his recent stroke he was referred to me to discuss timing of surgery..   Review of Systems: Out of a complete 14 system review, the patient complains of only the following symptoms, and all other reviewed systems are negative.  numbness - right hand armpit and right toe.  He sleeps well- anywhere anytime.  Epworth score  6 , Fatigue severity score   , depression score n/a    Social History   Social History  . Marital status: Married    Spouse name: N/A  . Number of children: N/A  . Years of education: N/A  Occupational History  . Not on file.   Social History Main Topics  . Smoking status: Former Smoker    Packs/day: 0.50    Years: 10.00    Quit date: 09/21/2016  . Smokeless tobacco: Never Used  . Alcohol use No  . Drug use: No  . Sexual activity: Not on file   Other Topics Concern  . Not on file   Social History Narrative  . No narrative on file    No family history on file.  Past Medical History:  Diagnosis Date  . Arthritis   . Broken ribs 2005  . Clavicular fracture   . Crohn's disease (HCC)   . DDD (degenerative disc disease), lumbar   . GERD (gastroesophageal reflux disease)   . Hypertension   . Hypothyroidism   . Numbness and tingling    right arm and thighs  . Sinus disease   . Stroke [facility])     Past Surgical History:  Procedure Laterality Date  . ANTERIOR CERVICAL DECOMP/DISCECTOMY FUSION N/A 05/12/2014   Procedure: Cervical four/five. Anterior cervical decompression/diskectomy/fusion;  Surgeon: Maeola Harman, MD;  Location: [facility] NEURO ORS;  Service: Neurosurgery;  Laterality: N/A;  . ANTERIOR CERVICAL DECOMP/DISCECTOMY FUSION N/A 01/16/2017   Procedure: C5-6 C6-7 Anterior cervical decompression/discectomy/fusion, Exploration of fusion at  C4-5, and  removal of plate;  Surgeon: Maeola Harman, MD;  Location: [facility];  Service: Neurosurgery;  Laterality: N/A;  C5-6 C6-7 Anterior cervical decompression/discectomy/fusion, Exploration of fusion at C4-5,  and  removal of plate  . COLONOSCOPY W/ BIOPSIES    . EYE SURGERY     when he was a child, doesn't remember which eye  . KNEE ARTHROSCOPY Right 2010  . NASAL SINUS SURGERY      Current Outpatient Prescriptions  Medication Sig Dispense Refill  . aspirin 325 MG tablet Take 325 mg by mouth daily.    . baclofen (LIORESAL) 10 MG tablet Take 10-20 mg by mouth at bedtime.     . DULoxetine (CYMBALTA) 30 MG capsule Take 30 mg by mouth daily.    Marland Kitchen esomeprazole (NEXIUM) 40 MG capsule Take 40 mg by mouth daily.     Marland Kitchen gabapentin (NEURONTIN) 300 MG capsule Take 300 mg by mouth 3 (three) times daily.    Marland Kitchen HYDROcodone-acetaminophen (NORCO) 7.5-325 MG per tablet Take 1 tablet by mouth 4 (four) times daily.     . simvastatin (ZOCOR) 40 MG tablet Take 40 mg by mouth daily.     . tamsulosin (FLOMAX) 0.4 MG CAPS capsule Take 0.4 mg by mouth See admin instructions. Every 3-4 days     No current facility-administered medications for this visit.     Allergies as of 03/26/2017 - Review Complete 03/26/2017  Allergen Reaction Noted  . Ticarcillin-pot clavulanate Rash   . Triamterene Hives 04/28/2014    Vitals: BP 126/80   Pulse 79   Ht  (1.753 m)   Wt 189 lb (85.7 kg)   BMI 27.91 kg/m  Last Weight:  Wt Readings from Last 1 Encounters:  03/26/17 189 lb (85.7 kg)   ZOX:WRUE mass index is 27.91 kg/m.     Last Height:   Ht Readings from Last 1 Encounters:  03/26/17  (1.753 m)    Physical exam:  General: The patient is awake, alert and appears not in acute distress. The patient is well groomed. Head: Normocephalic, atraumatic. Neck is supple. Mallampati 3,  neck circumference: 17. Nasal airflow patent , severely deviated septum - has frequently sinusitis  Cardiovascular:  Regular  rate and rhythm , without  murmurs or carotid bruit, and without distended neck veins. Respiratory: Lungs are clear to auscultation. Skin:  Without evidence of edema, or rash. Trunk: BMI is 28. The patient's posture is erect   Neurologic exam : The patient is awake and alert, oriented to place and time. Speech is fluent,  without  dysarthria, dysphonia or aphasia. Mood and affect are appropriate.  Cranial nerves: Pupils are equal and briskly reactive to light.   Extraocular movements  in vertical and horizontal planes intact and without nystagmus.  Hearing to finger rub intact. Facial motor strength is symmetric and tongue and uvula move midline.  Shoulder shrug was symmetrical.    Assessment:  After physical and neurologic examination, review of laboratory studies,  Personal review of imaging studies, reports of other /same  Imaging studies, results of polysomnography and / or neurophysiology testing and pre-existing records as far as provided in visit., my assessment is   1) Sean Poole has moderate severe obstructive sleep apnea- will continue to treat him with CPAP.  Compliance to CPAP is good-  2) PLms in sleep study- but he feels these do not reflect real sleep- is unaware of PLMs,  lately no RLS. . No treatment needed.   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 Marlis Edelson exam.   I spent more than 20 minutes of face to face time with the patient.  Greater than 50% of time was spent in counseling and coordination of care. We have discussed the diagnosis and differential and I answered the patient's questions.    Plan:  Treatment plan and additional workup : In 6 month CPAP visit.     Melvyn Novas, MD 03/26/2017, 3:54 PM  Certified in Neurology by ABPN Certified in Sleep Medicine by [facility] [address] [place], [place] [address]

## 2017-03-26 NOTE — Addendum Note (Signed)
Addended by: Melvyn Novas on: 03/26/2017 04:18 PM   Modules accepted: Orders

## 2017-04-02 ENCOUNTER — Ambulatory Visit: Payer: Self-pay | Admitting: Neurology

## 2017-04-29 ENCOUNTER — Telehealth: Payer: Self-pay | Admitting: Neurology

## 2017-04-29 NOTE — Telephone Encounter (Signed)
Called the patient to make him aware that he had his follow up apt for his CPAP on exactly 30 days after starting the machine and the apt needed to be made 31 days after starting the machine. The insurance is requiring that the apt happens that way. Pt was just recently seen but for insurance purposes and making sure that patient keeps his machine insurance will want to see documentation. Pt would need to be seen by 11/23 and [facility] our sleep lab on any thur. It will be a quick apt and Zella BallRobin has stated to make this visit a no charge visit.  No answer at this time, LVM for the pt to call back.

## 2017-05-11 NOTE — Telephone Encounter (Signed)
Spoke with the patient and made him aware of the situtation and we were able to get him in at no charge to see Zella BallRobin for his f/u once more. He will come in on thur at 9 am

## 2017-05-14 ENCOUNTER — Ambulatory Visit: Payer: Self-pay

## 2017-05-17 ENCOUNTER — Encounter: Payer: Self-pay | Admitting: Neurology

## 2017-05-18 ENCOUNTER — Ambulatory Visit (INDEPENDENT_AMBULATORY_CARE_PROVIDER_SITE_OTHER): Payer: Self-pay | Admitting: Adult Health

## 2017-05-18 ENCOUNTER — Encounter: Payer: Self-pay | Admitting: Adult Health

## 2017-05-18 VITALS — BP 171/99 | HR 79 | Ht 69.0 in | Wt 196.0 lb

## 2017-05-18 DIAGNOSIS — G4733 Obstructive sleep apnea (adult) (pediatric): Secondary | ICD-10-CM

## 2017-05-18 DIAGNOSIS — Z9989 Dependence on other enabling machines and devices: Secondary | ICD-10-CM

## 2017-05-18 NOTE — Patient Instructions (Signed)
Your Plan:  Continue  Use CPAP nightly  Mask refitting If your symptoms worsen or you develop new symptoms please let us know.     Thank you for coming to see [facility] at [facility]. I hope we have been able to provide you high quality care today.  You may receive a patient satisfaction survey over the next few weeks. We would appreciate your feedback and comments so that we may continue to improve ourselves and the health of our patients.

## 2017-05-18 NOTE — Progress Notes (Signed)
PATIENT: Sean Poole Notaro DOB: 09-24-1955  REASON FOR VISIT: follow up HISTORY FROM: patient  HISTORY OF PRESENT ILLNESS: Today 05/18/17 [name] is a 61 year old male with a history of obstructive sleep apnea on CPAP.  He returns today for follow-up.  His download indicates that he he uses his machine 30 out of 30 days for compliance of 100%.  He uses machine greater than 4 hours 23 out of 30 days for compliance of 77%.  On average he uses his machine 5 hours and 44 minutes.  He is on a minimum pressure of 5 cm of water and maximum pressure of 12 cmH2O with residual AHI 2.  The patient has a leak in the 95th percentile 26.6 L/min.  He states that he is interested in trying a new mask as he does feel that the nasal pillows leaking.  He returns today for an evaluation.  HISTORY 03/26/17: I have the pleasure of meeting with [name] today on 03/26/2017 following to sleep studies. His first study was on 12/12/2016 and diagnosed the patient was a moderate severe sleep apnea at 25.1 per hour, but during REM sleep exacerbated to 63.6 per hour there was also supine accentuation to an AHI of 42.6 per hour. There were very frequent limb movements per hour of sleep the patient kicked or had myoclonic jerking movements 79 times. Moderate snoring was noted but the patient's heart remained in normal sinus rhythm. He slept for 93% of the time. So the study was followed by a CPAP titration study from 01/12/2017 and the patient responded very well to CPAP at 12 cm water pressure was 1 cm EPR he used a nasal pillow apparatus but his periodic limb movements kicking and jerking movements persisted while under CPAP therapy he may need medication for this. [name] does not feel more refreshed and restored now that she uses CPAP, his compliance is good for 93% of the last 30 days but over the last 12 of September he has not been able to use it every night for 4 hours or more. The average user time is 5 hours 21 minutes  CPAP was set at 12 cm water was 1 cm EPR, his residual AHI is only 2.1 which is a good resolution of apnea. He consistently felt fatigued, FSS was endorsed at 30 points, Epworth sleepiness score was endorsed at 6 points. He feels he dreams less- sleeps less sound.  He would like to try auto-titration 5-12 cm water, 3 cm EPR and a mask allowing prone sleep. He often lost the nasal pillow - could try a wisp? His wife never mentioned PLMs- no kicking known- he finds the PLMs remarkable.      REVIEW OF SYSTEMS: Out of a complete 14 system review of symptoms, the patient complains only of the following symptoms, and all other reviewed systems are negative.  ALLERGIES: Allergies  Allergen Reactions  . Ticarcillin-Pot Clavulanate Rash    Timentin  . Triamterene Hives    Pt was given IV, and caused a breakout    HOME MEDICATIONS: Outpatient Medications Prior to Visit  Medication Sig Dispense Refill  . aspirin 325 MG tablet Take 325 mg by mouth daily.    . baclofen (LIORESAL) 10 MG tablet Take 10-20 mg by mouth at bedtime.     . DULoxetine (CYMBALTA) 30 MG capsule Take 30 mg by mouth daily.    Marland Kitchen. esomeprazole (NEXIUM) 40 MG capsule Take 40 mg by mouth daily.     .Marland Kitchen  gabapentin (NEURONTIN) 300 MG capsule Take 300 mg by mouth 3 (three) times daily.    Marland Kitchen HYDROcodone-acetaminophen (NORCO) 7.5-325 MG per tablet Take 1 tablet by mouth 4 (four) times daily.     . simvastatin (ZOCOR) 40 MG tablet Take 40 mg by mouth daily.     . tamsulosin (FLOMAX) 0.4 MG CAPS capsule Take 0.4 mg by mouth See admin instructions. Every 3-4 days     No facility-administered medications prior to visit.     PAST MEDICAL HISTORY: Past Medical History:  Diagnosis Date  . Arthritis   . Broken ribs 2005  . Clavicular fracture   . Crohn's disease (HCC)   . DDD (degenerative disc disease), lumbar   . GERD (gastroesophageal reflux disease)   . Hypertension   . Hypothyroidism   . Numbness and tingling    right arm and  thighs  . Sinus disease   . Stroke [facility])     PAST SURGICAL HISTORY: Past Surgical History:  Procedure Laterality Date  . C5-6 C6-7 Anterior cervical decompression/discectomy/fusion, Exploration of fusion at C4-5, and  removal of plate N/A 1/61/0960   Performed by Maeola Harman, MD at [facility] OR  . Cervical four/five. Anterior cervical decompression/diskectomy/fusion N/A 05/12/2014   Performed by Maeola Harman, MD at [facility] NEURO ORS  . COLONOSCOPY W/ BIOPSIES    . EYE SURGERY     when he was a child, doesn't remember which eye  . KNEE ARTHROSCOPY Right 2010  . NASAL SINUS SURGERY      FAMILY HISTORY: No family history on file.  SOCIAL HISTORY: Social History   Socioeconomic History  . Marital status: Married    Spouse name: Not on file  . Number of children: Not on file  . Years of education: Not on file  . Highest education level: Not on file  Social Needs  . Financial resource strain: Not on file  . Food insecurity - worry: Not on file  . Food insecurity - inability: Not on file  . Transportation needs - medical: Not on file  . Transportation needs - non-medical: Not on file  Occupational History  . Not on file  Tobacco Use  . Smoking status: Former Smoker    Packs/day: 0.50    Years: 10.00    Pack years: 5.00    Last attempt to quit: 09/21/2016    Years since quitting: 0.6  . Smokeless tobacco: Never Used  Substance and Sexual Activity  . Alcohol use: No  . Drug use: No  . Sexual activity: Not on file  Other Topics Concern  . Not on file  Social History Narrative  . Not on file      PHYSICAL EXAM  Vitals:   05/18/17 0741  BP: (!) 171/99  Pulse: 79  Weight: 196 lb (88.9 kg)  Height: 5\' 9"  (1.753 m)   Body mass index is 28.94 kg/m.  Generalized: Well developed, in no acute distress   Neurological examination  Mentation: Alert oriented to time, place, history taking. Follows all commands speech and language fluent Cranial nerve II-XII: Pupils were  equal round reactive to light. Extraocular movements were full, visual field were full on confrontational test. Facial sensation and strength were normal. Uvula tongue midline. Head turning and shoulder shrug  were normal and symmetric.  Neck circumference 17.5 inches Motor: The motor testing reveals 5 over 5 strength of all 4 extremities. Good symmetric motor tone is noted throughout.  Sensory: Sensory testing is intact to soft touch on  all 4 extremities. No evidence of extinction is noted.  Coordination: Cerebellar testing reveals good finger-nose-finger and heel-to-shin bilaterally.  Gait and station: Gait is normal. Tandem gait is normal. Romberg is negative. No drift is seen.  Reflexes: Deep tendon reflexes are symmetric and normal bilaterally.   DIAGNOSTIC DATA (LABS, IMAGING, TESTING) - I reviewed patient records, labs, notes, testing and imaging myself where available.  Lab Results  Component Value Date   WBC 6.4 01/09/2017   HGB 14.2 01/09/2017   HCT 42.3 01/09/2017   MCV 93.8 01/09/2017   PLT 259 01/09/2017      Component Value Date/Time   NA 140 01/09/2017 0909   K 4.1 01/09/2017 0909   CL 108 01/09/2017 0909   CO2 22 01/09/2017 0909   GLUCOSE 98 01/09/2017 0909   BUN 15 01/09/2017 0909   CREATININE 0.91 01/09/2017 0909   CALCIUM 9.4 01/09/2017 0909   GFRNONAA >60 01/09/2017 0909   GFRAA >60 01/09/2017 0909      ASSESSMENT AND PLAN 61 y.o. year old male  has a past medical history of Arthritis, Broken ribs (2005), Clavicular fracture, Crohn's disease (HCC), DDD (degenerative disc disease), lumbar, GERD (gastroesophageal reflux disease), Hypertension, Hypothyroidism, Numbness and tingling, Sinus disease, and Stroke (HCC). here with:  1.  Obstructive sleep apnea on CPAP  The patient's download shows good compliance and treatment of his apnea.  I will send an order over to his DME company for mask refitting due to the mask leaking.  The patient is amenable to this plan.   He is encouraged to continue using the CPAP nightly.  He is advised that if his symptoms worsen or he develops new symptoms he should let us know.  He will follow-up in 6 months or sooner if needed.     Butch PennyMegan Yamaira Spinner, MSN, NP-C 05/18/2017, 8:07 AM [facility] [address], [address] [place], [place] [address] [phone]

## 2017-05-19 ENCOUNTER — Encounter: Payer: Self-pay | Admitting: Adult Health

## 2017-05-19 NOTE — Progress Notes (Signed)
[facility] DME orders successfully faxed to [employer].

## 2017-06-04 ENCOUNTER — Ambulatory Visit: Payer: Medicare Other | Admitting: Nurse Practitioner

## 2017-07-22 ENCOUNTER — Ambulatory Visit: Payer: Medicare Other | Admitting: Neurology

## 2017-09-23 ENCOUNTER — Ambulatory Visit: Payer: Medicare Other | Admitting: Adult Health

## 2017-11-16 ENCOUNTER — Ambulatory Visit: Payer: Medicare Other | Admitting: Neurology

## 2018-02-23 ENCOUNTER — Ambulatory Visit: Payer: Medicare Other | Admitting: Neurology

## 2018-02-23 ENCOUNTER — Telehealth: Payer: Self-pay | Admitting: Neurology

## 2018-02-23 ENCOUNTER — Encounter: Payer: Self-pay | Admitting: Neurology

## 2018-02-23 NOTE — Telephone Encounter (Signed)
Pt was no show to apt today 

## 2018-03-12 ENCOUNTER — Ambulatory Visit: Admit: 2018-03-12 | Discharge: 2018-03-13 | Payer: MEDICARE

## 2018-03-12 DIAGNOSIS — K5000 Crohn's disease of small intestine without complications: Principal | ICD-10-CM

## 2018-03-12 DIAGNOSIS — I10 Essential (primary) hypertension: Secondary | ICD-10-CM

## 2018-03-12 DIAGNOSIS — R197 Diarrhea, unspecified: Secondary | ICD-10-CM

## 2018-03-12 DIAGNOSIS — R1011 Right upper quadrant pain: Secondary | ICD-10-CM

## 2018-03-12 DIAGNOSIS — Z9114 Patient's other noncompliance with medication regimen: Secondary | ICD-10-CM

## 2018-03-24 ENCOUNTER — Ambulatory Visit: Admit: 2018-03-24 | Discharge: 2018-03-25 | Payer: MEDICARE

## 2018-03-24 DIAGNOSIS — K5000 Crohn's disease of small intestine without complications: Principal | ICD-10-CM

## 2018-04-12 ENCOUNTER — Ambulatory Visit: Admit: 2018-04-12 | Discharge: 2018-04-12 | Payer: MEDICARE

## 2018-04-12 ENCOUNTER — Encounter: Admit: 2018-04-12 | Discharge: 2018-04-12 | Payer: MEDICARE | Attending: Anesthesiology | Primary: Anesthesiology

## 2018-04-12 DIAGNOSIS — K5000 Crohn's disease of small intestine without complications: Principal | ICD-10-CM

## 2018-04-13 MED ORDER — BUDESONIDE DR - ER 3 MG CAPSULE,DELAYED,EXTENDED RELEASE
ORAL_CAPSULE | 0 refills | 0 days | Status: CP
Start: 2018-04-13 — End: 2018-10-25

## 2018-04-13 MED ORDER — METHOTREXATE SODIUM 2.5 MG TABLET
ORAL_TABLET | ORAL | 0 refills | 0 days | Status: CP
Start: 2018-04-13 — End: 2018-05-12

## 2018-04-13 MED ORDER — FOLIC ACID 1 MG TABLET
ORAL_TABLET | Freq: Every day | ORAL | 3 refills | 0 days | Status: CP
Start: 2018-04-13 — End: 2019-04-13

## 2018-05-12 MED ORDER — METHOTREXATE SODIUM 2.5 MG TABLET
ORAL_TABLET | ORAL | 0 refills | 0.00000 days | Status: CP
Start: 2018-05-12 — End: 2018-06-09

## 2018-06-09 MED ORDER — METHOTREXATE SODIUM 2.5 MG TABLET
ORAL_TABLET | ORAL | 0 refills | 0 days | Status: CP
Start: 2018-06-09 — End: 2018-06-15

## 2018-06-15 MED ORDER — METHOTREXATE SODIUM 2.5 MG TABLET
ORAL_TABLET | ORAL | 0 refills | 0 days | Status: CP
Start: 2018-06-15 — End: ?

## 2018-06-29 ENCOUNTER — Ambulatory Visit: Admit: 2018-06-29 | Discharge: 2018-06-30 | Payer: MEDICARE

## 2018-06-29 DIAGNOSIS — K5000 Crohn's disease of small intestine without complications: Principal | ICD-10-CM

## 2018-06-29 DIAGNOSIS — Z79899 Other long term (current) drug therapy: Secondary | ICD-10-CM

## 2018-06-29 DIAGNOSIS — R1031 Right lower quadrant pain: Secondary | ICD-10-CM

## 2018-06-29 MED ORDER — BUDESONIDE DR - ER 3 MG CAPSULE,DELAYED,EXTENDED RELEASE
ORAL_CAPSULE | Freq: Every morning | ORAL | 1 refills | 0.00000 days | Status: CP
Start: 2018-06-29 — End: 2018-10-25

## 2018-07-12 MED ORDER — ONDANSETRON HCL 4 MG TABLET
ORAL_TABLET | Freq: Four times a day (QID) | ORAL | 0 refills | 0.00000 days | Status: CP | PRN
Start: 2018-07-12 — End: ?

## 2018-07-12 MED ORDER — PREDNISONE 10 MG TABLET
ORAL_TABLET | Freq: Every day | ORAL | 0 refills | 0 days | Status: CP
Start: 2018-07-12 — End: 2018-10-25

## 2018-07-20 ENCOUNTER — Ambulatory Visit: Admit: 2018-07-20 | Discharge: 2018-07-21 | Payer: MEDICARE

## 2018-07-20 DIAGNOSIS — Z79899 Other long term (current) drug therapy: Secondary | ICD-10-CM

## 2018-07-20 DIAGNOSIS — R1031 Right lower quadrant pain: Secondary | ICD-10-CM

## 2018-07-20 DIAGNOSIS — K5000 Crohn's disease of small intestine without complications: Principal | ICD-10-CM

## 2018-07-20 MED ORDER — USTEKINUMAB 90 MG/ML SUBCUTANEOUS SYRINGE
5 refills | 0 days | Status: CP
Start: 2018-07-20 — End: ?

## 2018-07-26 NOTE — Unmapped (Signed)
Called pt to inform him that he is approved for his stelara infusion, provided TIC phone number. He is currently applying for mfr assistnace with [employer] with Marvene Staff to see if he can get stelara injections at home. He is approved for SQ injections to be done in the clinic though and understands that. He will make app for infusion and see how his application goes with [employer] before making clinic app with RN for first injection 8 weeks after stelara infusion.

## 2018-08-17 NOTE — Unmapped (Signed)
Pt has scheduled stelara IV for 2/19 - called pt to set up Stelara q8weeks in clinic for 4/16. He did not answer, left Vm and sent mychart msg

## 2018-08-18 ENCOUNTER — Ambulatory Visit: Admit: 2018-08-18 | Discharge: 2018-08-19 | Payer: MEDICARE

## 2018-08-18 DIAGNOSIS — K50914 Crohn's disease, unspecified, with abscess: Principal | ICD-10-CM

## 2018-08-18 NOTE — Unmapped (Signed)
Pt presents for Stelara infusion.  Pt is aware of potential reaction/side effects, drug and discharge information provided.  IV placed, no premeds or labs ordered.  Call bell within reach.  1610 Stelara 520mg  in 250 ml started, to infuse over 1 hour per order.  1055 Infusion complete.  Pt tolerated without complication, VSS.  IV flushed per policy.  1110 IV d/c'd, gauze and coban applied.  Pt left clinic in no acute distress.

## 2018-09-09 ENCOUNTER — Encounter: Payer: Self-pay | Admitting: Neurology

## 2018-09-09 ENCOUNTER — Other Ambulatory Visit: Payer: Self-pay

## 2018-09-09 ENCOUNTER — Ambulatory Visit (INDEPENDENT_AMBULATORY_CARE_PROVIDER_SITE_OTHER): Payer: Medicare Other | Admitting: Neurology

## 2018-09-09 VITALS — BP 112/74 | HR 94 | Wt 189.8 lb

## 2018-09-09 DIAGNOSIS — I6930 Unspecified sequelae of cerebral infarction: Secondary | ICD-10-CM | POA: Diagnosis not present

## 2018-09-09 DIAGNOSIS — H532 Diplopia: Secondary | ICD-10-CM

## 2018-09-09 MED ORDER — MECLIZINE HCL 12.5 MG PO TABS: 12.5000 mg | ORAL_TABLET | Freq: Three times a day (TID) | ORAL | Status: AC | PRN

## 2018-09-09 NOTE — Patient Instructions (Signed)
I had a long discussion with the patient with regards to his symptoms of intermittent diplopia and discussed differential diagnosis and plan for evaluation and answered questions.  Recommend check MRI scan of the brain with and without contrast as well as MRA of the brain and neck to rule out any interval new brainstem stroke or aneurysm.  Start meclizine 12.5 mg 3 times daily as needed for his vertigo and dizziness.  Continue aspirin for stroke prevention and strict control of hypertension with blood pressure goal below 130/90, lipids with LDL cholesterol goal below 70 mg percent.  Check lipid profile and hemoglobin A1c.  He was also advised to continue follow-up with his ophthalmologist for his prisms and diplopia treatment.  He will return for follow-up in the future in 2 months or call earlier if necessary   Diplopia Diplopia is a condition in which a person sees two of a single object. It is also called double vision. There are two types of diplopia.  Monocular diplopia. This is double vision that affects only one eye. Monocular diplopia is often caused by a clouding of the lens in your eye (cataract) or by a problem in the way your eye focuses light.  Binocular diplopia. This is double vision that affects both eyes. However, when you shut one eye, the double vision will go away. Binocular diplopia may be more serious. It can be caused by: ? Problems with the nerves or muscles that are responsible for eye movement. ? Disease of the nerves (neurologic disease). ? Immune system conditions, such as Graves' disease. ? Migraine headaches. ? Tumors. ? An infection. ? A stroke. ? An injury. There are many causes of diplopia. Some are not dangerous and can be easily corrected. Diplopia may also be a symptom of a serious medical problem. You may need to see a health care provider who specializes in eye conditions (ophthalmologist) or a nerve specialist (neurologist) to find the cause. Follow these  instructions at home:   Pay attention to any changes in your vision. Tell your health care provider about them.  Do not drive or operate heavy machinery if diplopia interferes with your vision.  Keep all follow-up visits as told by your health care provider. This is important. Contact a health care provider if:  Your diplopia gets worse.  You develop any other symptoms along with your diplopia, such as: ? Weakness. ? Numbness. ? Headache. ? Eye pain. ? Clumsiness. ? Nausea. ? Drooping eyelids. ? Abnormal movement of one eye. Get help right away if you:  Have sudden vision loss.  Suddenly get a very bad headache.  Have sudden weakness or numbness.  Suddenly lose the ability to speak, understand speech, or both. These symptoms may represent a serious problem that is an emergency. Do not wait to see if the symptoms will go away. Get medical help right away. Call your local emergency services (911 in the [place] Do not drive yourself to the hospital. Summary  Diplopia is a condition in which a person sees two of a single object. It is also called double vision.  Monocular diplopia is double vision that affects only one eye. It is often caused by a clouding of the lens in your eye (cataract) or by a problem in the way your eye focuses light.  Binocular diplopia is double vision that affects both eyes. However, when you shut one eye, the double vision will go away. Binocular diplopia may be more serious.  If you have diplopia, you  may need to see a health care provider who specializes in eye conditions (ophthalmologist) or a nerve specialist (neurologist) to find the cause. This information is not intended to replace advice given to you by your health care provider. Make sure you discuss any questions you have with your health care provider. Document Released: 04/17/2004 Document Revised: 06/10/2017 Document Reviewed: 06/10/2017 Elsevier Interactive Patient Education  2019  [employer].

## 2018-09-09 NOTE — Progress Notes (Signed)
[facility] [address] [place]. [place] [address] [phone]       OFFICE CONSULT NOTE  [name] Date of Birth:  1955/11/04 Medical Record Number:  [id number]   Referring MD:  Sean Poole Reason for Referral:  Diplopia HPI: Sean Poole is a 63 year old Caucasian male seen today for initial office consultation visit for diplopia.  History is obtained from him and review of electronic medical records.  I have reviewed available imaging films in PACS. He has past medical history of Crohn's disease, hypertension, hyperlipidemia, degenerative cervical spine disease.  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  He states he noticed diplopia a couple of months ago.  He was seen in the ER at [facility] and states he had a CT scan of the head which was unremarkable.  I do not have access to those records or films today.  He states that his diplopia is binocular and his eyes are crossing.  Diplopia disappears when he closes either eye.  He was seen by an eye doctor in [place] who prescribed prisms which she has been wearing which seems to help.  Patient has previous history of surgery in his left eye about 30 years ago and the eye doctor feels that this may be acting up.  Unfortunately I do not have records from the eye doctor to review today.  He does have a prior history of lacunar stroke 2 years ago and has had right-sided residual paresthesias from that.  He saw me at that time.  He was found to have a small PFO but was not felt to be a candidate for endovascular closure.  He has been on aspirin since then which is tolerating well without bleeding or bruising.  He states his blood pressure is under good control and today it is 1 one 2/74.  Is also tolerating Zocor well without muscle aches and pains but cannot tell me when his last lipid profile was checked.  He is worried about having another stroke.  He also states that he has had a  history of acoustic neuroma and complains of ringing sound in both ears.  He feels his hearing is also diminished though on objective testing in the in the office today he said he did fine.  He was following with the ENT physician and last brain scan was more than 3 years ago.  He denies any fatigability, tiredness, diurnal variation in his diplopia.  Denies headaches gait or balance problems. ROS:   14 system review of systems is positive for fatigue, hearing loss, ringing in the ears, spinning sensation, double vision, diarrhea, joint pain, dizziness, decreased energy, disinterest in activities and all other systems negative  PMH:  Past Medical History:  Diagnosis Date   Arthritis    Broken ribs 2005   Clavicular fracture    Crohn's disease (HCC)    DDD (degenerative disc disease), lumbar    GERD (gastroesophageal reflux disease)    Hypertension    Hypothyroidism    Numbness and tingling    right arm and thighs   Sinus disease    Stroke [facility])     Social History:  Social History   Socioeconomic History   Marital status: Married    Spouse name: Not on file   Number of children: Not on file   Years of education: Not on file   Highest education level: Not on file  Occupational History   Not on file  Social Needs  Financial resource strain: Not on file   Food insecurity:    Worry: Not on file    Inability: Not on file   Transportation needs:    Medical: Not on file    Non-medical: Not on file  Tobacco Use   Smoking status: Former Smoker    Packs/day: 0.50    Years: 10.00    Pack years: 5.00    Last attempt to quit: 09/21/2016    Years since quitting: 1.9   Smokeless tobacco: Never Used  Substance and Sexual Activity   Alcohol use: No   Drug use: No   Sexual activity: Not on file  Lifestyle   Physical activity:    Days per week: Not on file    Minutes per session: Not on file   Stress: Not on file  Relationships   Social connections:     Talks on phone: Not on file    Gets together: Not on file    Attends religious service: Not on file    Active member of club or organization: Not on file    Attends meetings of clubs or organizations: Not on file    Relationship status: Not on file   Intimate partner violence:    Fear of current or ex partner: Not on file    Emotionally abused: Not on file    Physically abused: Not on file    Forced sexual activity: Not on file  Other Topics Concern   Not on file  Social History Narrative   Not on file    Medications:   Current Outpatient Medications on File Prior to Visit  Medication Sig Dispense Refill   aspirin 325 MG tablet Take 325 mg by mouth daily.     baclofen (LIORESAL) 10 MG tablet Take 10-20 mg by mouth at bedtime.      esomeprazole (NEXIUM) 40 MG capsule Take 40 mg by mouth daily.      gabapentin (NEURONTIN) 300 MG capsule Take 300 mg by mouth 3 (three) times daily.     gabapentin (NEURONTIN) 300 MG capsule gabapentin 300 mg capsule     HYDROcodone-acetaminophen (NORCO) 7.5-325 MG per tablet Take 1 tablet by mouth 3 (three) times daily.      lisinopril (PRINIVIL,ZESTRIL) 20 MG tablet lisinopril 20 mg tablet     simvastatin (ZOCOR) 40 MG tablet Take 40 mg by mouth daily.      tamsulosin (FLOMAX) 0.4 MG CAPS capsule Take 0.4 mg by mouth See admin instructions. Every 3-4 days     No current facility-administered medications on file prior to visit.     Allergies:   Allergies  Allergen Reactions   Ticarcillin-Pot Clavulanate Rash    Timentin   Triamterene Hives    Pt was given IV, and caused a breakout    Physical Exam General: well developed, well nourished middle-aged Caucasian male, seated, in no evident distress Head: head normocephalic and atraumatic.   Neck: supple with no carotid or supraclavicular bruits.  Surgical scar from anterior cervical spine surgery x2 Cardiovascular: regular rate and rhythm, no murmurs Musculoskeletal: no  deformity Skin:  no rash/petichiae Vascular:  Normal pulses all extremities  Neurologic Exam Mental Status: Awake and fully alert. Oriented to place and time. Recent and remote memory intact. Attention span, concentration and fund of knowledge appropriate. Mood and affect appropriate.  Cranial Nerves: Fundoscopic exam reveals sharp disc margins. Pupils equal, briskly reactive to light. Extraocular movements full without nystagmus. Visual fields full to confrontation. Hearing intact. Facial  sensation intact. Face, tongue, palate moves normally and symmetrically.  Motor: Normal bulk and tone. Normal strength in all tested extremity muscles. Sensory.:  Subjective diminished   touch , pinprick in right upper and lower extremity., position and vibratory sensation.  Coordination: Rapid alternating movements normal in all extremities. Finger-to-nose and heel-to-shin performed accurately bilaterally. Gait and Station: Arises from chair without difficulty. Stance is normal. Gait demonstrates normal stride length and balance . Able to heel, toe and tandem walk without difficulty.  Reflexes: 1+ and symmetric. Toes downgoing.       ASSESSMENT: 63 year old Caucasian male with symptoms of intermittent diplopia of unclear etiology.  Remote history of left basal ganglia infarct in 2018 with residual right-sided paresthesias.  Vascular risk factors of hypertension hyperlipidemia, PFO and smoking.  Also remote history of acoustic neuroma.  Chronic neck and arm pain due to degenerative cervical spine disease status post surgery x2     PLAN: I had a long discussion with the patient with regards to his symptoms of intermittent diplopia and discussed differential diagnosis and plan for evaluation and answered questions.  Recommend check MRI scan of the brain with and without contrast as well as MRA of the brain and neck to rule out any interval new brainstem stroke or aneurysm.  Start meclizine 12.5 mg 3 times daily  as needed for his vertigo and dizziness.  Continue aspirin for stroke prevention and strict control of hypertension with blood pressure goal below 130/90, lipids with LDL cholesterol goal below 70 mg percent.  Check lipid profile and hemoglobin A1c.  He was also advised to continue follow-up with his ophthalmologist for his prisms and diplopia treatment.  He does not 50% time during this 45-minute consultation visit was spent on counseling and coordination of care about his diplopia and remote stroke and answering questions he will return for follow-up in the future in 2 months or call earlier if necessary  Delia Heady, MD  [facility] [address] [place], [place] [address]  Phone [phone] Fax [phone] Note: This document was prepared with digital dictation and possible smart phrase technology. Any transcriptional errors that result from this process are unintentional.

## 2018-09-20 ENCOUNTER — Other Ambulatory Visit: Payer: Self-pay

## 2018-09-20 ENCOUNTER — Ambulatory Visit
Admission: RE | Admit: 2018-09-20 | Discharge: 2018-09-20 | Disposition: A | Discharge status: Home / Self Care | Payer: Medicare Other | Source: Ambulatory Visit | Attending: Neurology | Admitting: Neurology

## 2018-09-20 DIAGNOSIS — H532 Diplopia: Secondary | ICD-10-CM | POA: Diagnosis not present

## 2018-09-20 MED ORDER — GADOBENATE DIMEGLUMINE 529 MG/ML IV SOLN
19.0000 mL | Freq: Once | INTRAVENOUS | Status: AC | PRN
  Administered 2018-09-20: 19 mL via INTRAVENOUS

## 2018-10-08 NOTE — Unmapped (Signed)
Patient due for first stelara injection week of 10/11/2018.  Needs to be scheduled at [facility] clinic on 4/14 or 4/15.  LM for patient today. Scheduling team also notified and has reached out to patient to schedule.

## 2018-10-11 NOTE — Unmapped (Signed)
Attempted to reach patient again. Was able to speak with his wife and coordinated SQ dose for 4/14 at 830am.  Reviewed needing for alternative solution post this dose due to Medicare coverage changes but will have 8weeks after tomorrow to resolve.

## 2018-10-12 ENCOUNTER — Institutional Professional Consult (permissible substitution): Admit: 2018-10-12 | Discharge: 2018-10-13 | Payer: MEDICARE

## 2018-10-12 DIAGNOSIS — K50914 Crohn's disease, unspecified, with abscess: Principal | ICD-10-CM

## 2018-10-12 NOTE — Unmapped (Signed)
stelara 90mg /ml was given subq. lot #.[id number] exp date 07/22.tolerated well. Instructed patient that nurse coordinator will f/u with him about next injection

## 2018-10-25 ENCOUNTER — Telehealth: Admit: 2018-10-25 | Discharge: 2018-10-26 | Payer: MEDICARE

## 2018-10-25 DIAGNOSIS — R1031 Right lower quadrant pain: Principal | ICD-10-CM

## 2018-10-25 DIAGNOSIS — K5000 Crohn's disease of small intestine without complications: Secondary | ICD-10-CM

## 2018-10-25 DIAGNOSIS — R1114 Bilious vomiting: Secondary | ICD-10-CM

## 2018-10-25 MED ORDER — PREDNISONE 10 MG TABLET
ORAL_TABLET | Freq: Every day | ORAL | 0 refills | 0.00000 days | Status: CP
Start: 2018-10-25 — End: ?

## 2018-10-25 NOTE — Unmapped (Signed)
INSTRUCTIONS:     1.   Start prednisone 40mg  for 2 weeks, then 30mg  for 1 week, then 20mg  for 1 week, then 10mg  for 1 week, then stop.   2.  Use calcium and vitamin D supplement while on prednisone.   3.  We will schedule upper endoscopy and colonoscopy in next few weeks.    4.  Depending on these results, we can discuss potentially changing to other medications besides Stelara.    [name], MD  Assistant Professor of Medicine  Division of Gastroenterology & Hepatology  [place] [facility]    Clinical Nurse Contact:  Alesia Banda, RN (covering for Neta Mends)  Ph#  [phone]  Fax#  [phone]    Office Number Rubbie Battiest):  Ph# [phone]  Fax # [phone]    For clinic appointments, please call, [phone], option 1.  For questions regarding radiology appointments, or to schedule, [phone].  For questions regarding scheduling GI procedures (e.g,. Colonoscopy), please call, [phone], option 2.    For educational material and resources:  [email or web address]  ============================================

## 2018-10-25 NOTE — Unmapped (Signed)
FOLLOW UP CONSULT NOTE - INFLAMMATORY BOWEL DISEASE  10/25/2018    Demographics:  Raymond Wright is a 63 y.o. year old male    Diagnosis:  Crohn's Disease  Disease onset (yr):  2011  Age at onset:  > 2yr old (A3)  Location:  Ileal (L1)  Behavior:  Nonstricturing,nonpenetrating (B1)  Current Tight Control Scenario:   Maintnenace = IMM    Referring physician:   Eldridge Abrahams, MD  [address]  [place], [place] [address]          HPI / NOTE :     PHONE ENCOUNTER (patient had difficulty launching video)    HPI / Interval Events:   1.  Since Jan 2020, patient has been on Stelara, he has gotten first IV dose and 2 subcutaneous doses.   2.  He reports no improvement since being on Stelara.  Still having diarrhea 5-6 times per day.  Also ongoing dull right lower quadrant crampy pain as before.  He also notices ongoing nausea which he feels is the most bothersome symptom.  He will occasionally have vomiting and feels that  my food does not digest well.  Occasionally he notices small pieces of food in the emesis, but other times it is more bilious fluid.  3.  He denies any extraintestinal symptoms.  4.  He felt no difference while he was on budesonide.  5.  He is still smoking at least half pack per day.  He also reports that he is taking Lortab 2-3 times per day for low back pain which he is taken for some time.  He is also taking gabapentin for back pain.    TIME on PHONE    Abdominal pain (0-10):  intermittent RLQ pain  BM a day: 5-6x/day  Consistency: loose, like sand  % of stools have blood: 0%  Nocturnal BM: no  Urgency:  no  Nausea/vomiting: frequent nausea, occasional vomiting  Smoking: yes, 1/2ppd  NSAIDS: avoids    Review of Systems:   Review of systems positive for: negative except as above.   Otherwise, the balance of 10 systems is negative.          IBD HISTORY:     Year of disease onset:  2011    Brief IBD Disease Course:    - 2011 - onset of abdominal pain and diarrhea.  Had MRI locally - told thickening of the bowel and then had a colonoscopy and given dx of ileal Crohn's disease.  Treated with Entocort.   - 2014 - September, hospitalized with severe diarrhea, was treated with Prednisone and then Humira.  Humira stopped in 2015 due to joint symptoms and (+)ANA. Then treated with Entocort + MTX and doing well since then.   - 2016 - continued on MTX 25mg  / week  - 2017 - self-discontinued MTX, no GI follow up  - 2019 - re-established GI care with [place] at [facility].  Restarted methotrexate.     Endoscopy:      - Colonoscopy 10/14/13 - mild ileal Crohn's disease with aphthae, Two 3-25mm polyps in rectosigmoid colon (Path = tubular adenoma), otherwise normal colon.   - Colonoscopy 04/12/18 - poor prep - moderate ileitis for at least 10cm. Otherwise normal colon.      Imaging:    - CT A/P 10/22/13 - normal bowel with no signs of active inflammatino.   - CT-E 03/24/18 - inflammation and thickening of terminal ileum consistent with Crohn's flare. Bilateral renal stones.  Prior IBD medications (type, dose, duration, response):  ? 5-ASAs  x Oral corticosteroids - Prednisone, Entocort  ? Intravenous corticosteroids  ? Antibiotics  ? Thiopurines  x Methotrexate - started April 2015.  x Anti-TNF therapies - Humira 2014 - then developed joint pains and (+)ANA so it was stopped  ? Anti-Integrin therapies  ? Cyclosporine  ? Clinical trial medication  ? Other (Please specify):    Extraintestinal manifestations:   -joint pains affecting: n  -eye: n  -skin: n  -oral ulcers :  n  -blood clots: n  -PSC: n  -other: n          Past Medical History:   Past medical history:   Past Medical History:   Diagnosis Date   ??? AC (acromioclavicular) joint bone spurs     in back   ??? Bulging discs    ??? Crohn's disease (CMS-HCC)    ??? Degenerative disc disease    ??? GERD (gastroesophageal reflux disease)    ??? Hypertension    ??? Stroke (CMS-HCC)      Past surgical history:   Past Surgical History:   Procedure Laterality Date   ??? CERVICAL FUSION  2016 ??? KNEE ARTHROSCOPY Right    ??? PR COLONOSCOPY W/BIOPSY SINGLE/MULTIPLE N/A 04/12/2018    Procedure: COLONOSCOPY, FLEXIBLE, PROXIMAL TO SPLENIC FLEXURE; WITH BIOPSY, SINGLE OR MULTIPLE;  Surgeon: [name], MD;  Location: [facility];  Service: Gastroenterology   ??? PR COLSC FLX W/RMVL OF TUMOR POLYP LESION SNARE TQ N/A 10/14/2013    Procedure: COLONOSCOPY FLEX; W/REMOV TUMOR/LES BY SNARE;  Surgeon: Theadore Nan, MD;  Location: [facility];  Service: Gastroenterology     Family history:   Family History   Problem Relation Age of Onset   ??? Cancer Mother         breast   ??? Crohn's disease Neg Hx    ??? Ulcerative colitis Neg Hx    ??? Colorectal Cancer Neg Hx      Social history:   Social History     Socioeconomic History   ??? Marital status: Married     Spouse name: Not on file   ??? Number of children: Not on file   ??? Years of education: Not on file   ??? Highest education level: Not on file   Occupational History   ??? Not on file   Social Needs   ??? Financial resource strain: Not on file   ??? Food insecurity     Worry: Not on file     Inability: Not on file   ??? Transportation needs     Medical: Not on file     Non-medical: Not on file   Tobacco Use   ??? Smoking status: Current Every Day Smoker     Packs/day: 0.25     Types: Cigarettes     Start date: 06/2009   ??? Smokeless tobacco: Never Used   Substance and Sexual Activity   ??? 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 Drug use: No   ??? Sexual activity: Not on file   Lifestyle   ??? Physical activity     Days per week: Not on file     Minutes per session: Not on file   ??? Stress: Not on file   Relationships   ??? Social Wellsite geologist on phone: Not on file  Gets together: Not on file     Attends religious service: Not on file     Active member of club or organization: Not on file     Attends meetings of clubs or organizations: Not on file     Relationship status: Not on file   Other Topics Concern   ??? Not on file   Social History Narrative   ??? Not on file             Allergies:     Allergies   Allergen Reactions   ??? Triamterene Hives     Pt was given IV, and caused a breakout   ??? Ticarcillin-Clavulanate Rash     Timentin             Medications:     Current Outpatient Medications   Medication Sig Dispense Refill   ??? ASA/ACETAMINOPHN/CAFFEIN/POT (GOODY'S HEADACHE POWDER ORAL) Take 1 packet by mouth daily as needed (headache).      ??? BACLOFEN ORAL Take 5 mg by mouth nightly.     ??? BENICAR 20 mg tablet Take 20 mg by mouth daily.      ??? cholecalciferol, vitamin D3, (VITAMIN D3) 5,000 unit tablet Take 5,000 Units by mouth daily.     ??? cyclobenzaprine (FLEXERIL) 10 MG tablet Take 10 mg by mouth nightly as needed.      ??? folic acid (FOLVITE) 1 MG tablet Take 1 tablet (1 mg total) by mouth daily. 90 tablet 3   ??? gabapentin (NEURONTIN) 300 MG capsule Take 300 mg by mouth Three (3) times a day.     ??? HYDROcodone-acetaminophen (NORCO) 5-325 mg per tablet Take 3 tablets by mouth once daily.      ??? HYDROcodone-acetaminophen (NORCO) 7.5-325 mg per tablet Take 3 tablets by mouth daily.      ??? lisinopril (PRINIVIL,ZESTRIL) 20 MG tablet Take 20 mg by mouth daily. prn     ??? methotrexate 2.5 MG tablet Take 10 tablets (25 mg total) by mouth once a week. 120 tablet 0   ??? NEXIUM 40 mg capsule Take 40 mg by mouth every morning before breakfast.      ??? ondansetron (ZOFRAN) 4 MG tablet Take 1 tablet (4 mg total) by mouth every six (6) hours as needed for nausea. (Patient not taking: Reported on 07/20/2018) 30 tablet 0   ??? predniSONE (DELTASONE) 10 MG tablet Take 4 tablets (40 mg total) by mouth daily. 120 tablet 0   ??? rosuvastatin (CRESTOR) 20 MG tablet Take 20 mg by mouth daily.     ??? SIMCOR 1,000-20 mg 24 hr tablet Take 1 tablet by mouth once daily.      ??? simvastatin (ZOCOR) 40 MG tablet      ??? tamsulosin (FLOMAX) 0.4 mg capsule      ??? ustekinumab (STELARA) 90 mg/mL Syrg syringe INJECT 1 SYRINGE UNDER THE SKIN ONCE EVERY 8 WEEKS 1 mL 5 No current facility-administered medications for this visit.            Physical Exam:   There were no vitals taken for this visit.    PHONE VISIT - NO VITALS OR EXAM          Labs, Data & Indices:     Lab Review:   Lab Results   Component Value Date    WBC 17.6 (H) 07/20/2018    WBC 6.4 08/29/2014    RBC 4.35 (L) 07/20/2018    RBC 4.25 (L) 08/29/2014    HGB  14.3 07/20/2018    HGB 14.2 08/29/2014     Lab Results   Component Value Date    AST 20 07/20/2018    AST 18 (L) 08/29/2014    ALT 21 07/20/2018    ALT 27 08/29/2014    BUN 21 07/20/2018    BUN 19 10/04/2013    Creatinine 0.86 07/20/2018    Creatinine 0.74 08/29/2014    CO2 23.0 07/20/2018    CO2 25 10/04/2013    Albumin 4.3 07/20/2018    Albumin 4.3 08/29/2014    Calcium 9.9 07/20/2018    Calcium 9.8 10/04/2013     Lab Results   Component Value Date    TSH 1.52 10/04/2013      ............................................................................................................................................  ORDERS THIS VISIT:       Diagnosis ICD-10-CM Associated Orders   1. Right lower quadrant abdominal pain R10.31    2. Crohn's disease of small intestine without complication (CMS-HCC) K50.00    3. Bilious vomiting with nausea R11.14             Assessment & Recommendations:   Disease state:  Clinical remission on immune modulator     Joesph Marcy is a 63 y.o. male with a hx of ileal Crohn's disease dating back to ~2011.  He was previously on humira but stopped due to worsening joint pains and (+)ANA. He was on MTX from ~2015 - 2017, but he self discontinued this medication since he was feeling well.  He developed inflammatory ileal Crohn's disease flare in fall 2019.  We restarted Entocort + MTX but he had little improvement.  Hence, we changed to [facility] in Feb 2020.  He has had no improvement in symptoms after almost 3 months on Stelara - if anything he sounds a bit worse with slightly worse diarrhea and increased nausea.  We will re-assess his Crohn's disease activity at this time with an EGD + Colonoscopy and also use a prednisone taper to try to improve his symptoms in the short term.  If we find active Crohn's disease, I think we would need to change to alternative medications, such as Vedolizumab (if steroid responsive) or anti-TNF such as Cimzia if not steroid responsive.      PLAN:  1.   Start prednisone 40mg  for 2 weeks, then 30mg  for 1 week, then 20mg  for 1 week, then 10mg  for 1 week, then stop.   2.  Use calcium and vitamin D supplement while on prednisone.   3.  We will schedule upper endoscopy and colonoscopy in next few weeks.    4.  Depending on these results, we can discuss potentially changing to other medications besides Stelara.     IBD health maintenance:  Influenza vaccine: 03/12/18  Pneumonia vaccine:  Prevnar 08/10/15, Pneumovax ~2012, Pneumovax 03/12/18  Hepatitis B: sAg neg 03/12/18  TB testing: Quantiferon neg 03/12/18  Chickenpox/Shingles history:  Had chickenpox, needs Shingrix  Bone denistometry:   Derm appointment:  Last colonoscopy:  2019  --------------------------------------------  Author: [name] 10/25/2018 9:24 AM    [name], MD  Clinical Assistant Professor of Medicine  Division of Gastroenterology & Hepatology  [facility]  ============================================  I spent 31 minutes on the phone with the patient. I spent an additional 5 minutes on pre- and post-visit activities.     The patient was physically located in [place] or a state in which I am permitted to provide care. The patient understood that s/he may incur co-pays  and cost sharing, and agreed to the telemedicine visit. The visit was completed via phone and/or video, which was appropriate and reasonable under the circumstances given the patient's presentation at the time.    The patient has been advised of the potential risks and limitations of this mode of treatment (including, but not limited to, the absence of in-person examination) and has agreed to be treated using telemedicine. The patient's/patient's family's questions regarding telemedicine have been answered.     If the phone/video visit was completed in an ambulatory setting, the patient has also been advised to contact their provider???s office for worsening conditions, and seek emergency medical treatment and/or call 911 if the patient deems either necessary.

## 2018-10-26 ENCOUNTER — Telehealth: Payer: Self-pay

## 2018-10-26 NOTE — Telephone Encounter (Signed)
Left vm for patient to call back about visit change due to COVID 19. Need to know if he has a I phone or phone with a camera. Need to know his email address and can he access it from his phone. Advise pt to call during business hours between 0800 and 500pm. Need his verbal consent to do video visit and to file insurance.

## 2018-11-01 NOTE — Telephone Encounter (Signed)
I called pt that vm was left 10/27/2018 about visit change to video.Pt stated he did receive my vm about visit being change to video. He verified cell phone. Chart was updated by RN. HE gave verbal consent to do video and to file insurance. I explain Dr. Pennie Rushing will text him the link day of visit. ALso 5 10 minutes prior to appt he will click link  before your appointment time. You will be asked to enter your name (First and Last) and then it will put you. Please give the provider time to join the meeting. Pt verbalized understanding.

## 2018-11-02 ENCOUNTER — Ambulatory Visit (INDEPENDENT_AMBULATORY_CARE_PROVIDER_SITE_OTHER): Payer: Medicare Other | Admitting: Neurology

## 2018-11-02 ENCOUNTER — Other Ambulatory Visit: Payer: Self-pay

## 2018-11-02 DIAGNOSIS — H532 Diplopia: Secondary | ICD-10-CM | POA: Diagnosis not present

## 2018-11-02 NOTE — Progress Notes (Signed)
Virtual Visit via Video Note  I connected with Sean Poole on 11/02/18 at 10:30 AM EDT by a video enabled telemedicine application and verified that I am speaking with the correct person using two identifiers.  This visit was performed using [email or web address] application.  I could see the patient but the audio link was not good hence the visit was performed using patient's telephone further or yelling.  Location: Patient: at his home Provider: at [facility]   I discussed the limitations of evaluation and management by telemedicine and the availability of in person appointments. The patient expressed understanding and agreed to proceed.  History of Present Illness: Patient was seen today for virtual follow-up visit following initial consultation visit on 09/09/2018.  Patient states he is doing a lot better than the last visit.  His diplopia seems to have improved.  He if has been drinking lots of fluids and feels this may have helped.  He has also talked to his eye doctor who feels he may need another eye surgery in his diplopia may be secondary to his remote eye surgery.  Patient was prescribed meclizine by me and last visit for dizziness but feels that the prescription did not go through and he never took it.  He however feels that he does not need it anymore as his dizziness is also improved and is a lot better.  He underwent MRI scan of the brain on 09/20/2018 which I personally reviewed and was unremarkable.  MRA of the brain and neck both did not show any large vessel stenosis, occlusion or aneurysms.  He states he did undergo lab work with his primary physician Dr. Jonelle SportsPomposini and cholesterol was satisfactory and his sugar was slightly high because he had been on steroids for his Crohn's disease.  I was unable to find these lab results in care everywhere.  Patient states his blood pressure has been running low and he has reduced dose of lisinopril to half a tablet.  His Zocor has been switched to Crestor but he  cannot tell me the dose.  Continues to have his chronic right hemibody post stroke paresthesias and gabapentin is only partially effective.  He has noticed increased paresthesias in his right wrist and feels this may be related to increased physical activity that he has been doing with his hands.  He remains on aspirin which is tolerating well without any side effects he has no other new complaints today.   Observations/Objective: Physical and neurological exam was limited due to constraints from video and telephonic virtual visit  Assessment and Plan: 63 year old Caucasian male with symptoms of intermittent monocular diplopia which appear to have resolved and are probably related to his remote eye surgery.  No structural or neurovascular abnormality on brain imaging to explain this.  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  Increase right wrist paresthesias recently may be related to component of carpal tunnel.  Vascular risk factors of hypertension, hyperlipidemia, PFO, smoking and cerebrovascular disease.  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  Follow Up Instructions: I had a long discussion with the patient regarding his intermittent diplopia symptoms which appear to have resolved and are unlikely to be of primary neurological etiology given monocular nature of his diplopia and unremarkable brain imaging studies.  I recommend he follow-up with his ophthalmologist for further evaluation and treatment of this condition if necessary. I also recommend he start wearing carpal tunnel splint  at the right wrist to help with his increased right hand paresthesias and continue gabapentin as well. Continue aspirin for stroke prevention with strict control of hypertension with blood pressure goal below 130/90, lipids with LDL cholesterol goal below 70 mg percent and diabetes with hemoglobin  A1c goal below 6.5%.  I encouraged him to eat a healthy diet with lots of fruits, vegetables, cereals, whole grains and to be active and exercise 30 minutes a day at least. No routine scheduled follow-up appointment with me is necessary but he may return for follow-up only as needed   I discussed the assessment and treatment plan with the patient. The patient was provided an opportunity to ask questions and all were answered. The patient agreed with the plan and demonstrated an understanding of the instructions.   The patient was advised to call back or seek an in-person evaluation if the symptoms worsen or if the condition fails to improve as anticipated.  I provided 25 minutes of non-face-to-face time during this encounter.   Delia Heady, MD

## 2018-11-19 NOTE — Unmapped (Signed)
-----   Message from Merleen Nicely, RN sent at 11/18/2018  7:13 PM EDT -----  Regarding: COVID PRETEST  Patient has a care plan that requires asymptomatic respiratory testing prior to implementation.   The date 11/25/2018, time 1:30pm of the treatment, surgery, or procedure for which they need testing is Colonoscopy/EGD.  Please schedule a COVID PRE TEST appointment in [facility] Quick Test [facility].       Please schedule for [facility] site in [place]. Thank you!

## 2018-11-19 NOTE — Unmapped (Signed)
Patient was called to schedule for COVID Pre Test on May 26 at 0900 a.m. at [facility].    Did patient confirm appointment? Yes    Patient is aware of date, time, and location    [employer]

## 2018-11-19 NOTE — Unmapped (Signed)
Contacted patient to complete COVID19 screening for GI procedure on 11/26/2018. Spoke with pt's wife, who stated he was not available to come to the phone. Left message regarding need to schedule COVID pretesting appt prior to procedure. She requested to call back tomorrow between 9-10am or after 1:30pm. Will try to reach patient again later. Messaged [facility] to schedule pretest at site in [place], [place]. No other questions/concerns from pt at this time.

## 2018-11-23 NOTE — Unmapped (Addendum)
Attempted to contact pt regarding COVID19 screening for GI procedure on 11/25/2018. Unable to reach on Home line. Left voicemail message requesting call back and provided contact number. Will try to reach patient again.

## 2018-12-07 NOTE — Unmapped (Signed)
Patient has been receiving stelara under Medicare medical benefit for [id number] which unfornately will no longer be covered after 03/09/2019.  Patient notified. Will need to come up with alternative solution for coverage.     Process was started in February but patient has not responded after several attempts by MAP team to work on patient assistance application. Letter regarding coverage issue was sent to patient on October 26, 2018.

## 2018-12-24 NOTE — Unmapped (Signed)
Called pt to discuss care:  Overdue for stelara injection - should have had second SQ dose in clinic around 6/14, was suppose to have a colonoscopy/egd - has not been scheduled but multiple attempts made.     Pt did not answer, left VM. Attempted pt cell phone number as well. Unable to leave VM as mailbox is full

## 2018-12-28 NOTE — Unmapped (Signed)
Second attempt to contact pt regarding stelara no longer being covered by medicare in clinic, also overdue for next dose, also was planned by Dr. Raphael Gibney to have colonoscopy/egd.   Spoke with pt today for app - he had concerns regarding coming to [facility] for his colonoscopy/egd, but now understands [facility] is open for procedures. He is still hesitant, but doesn't want to continue stelara, did not feel it was helping his disease. He states I have good days and bad days but he will make appointment for procedures soon. Understands he will need to have neg COVID rapid test within 48hrs of procedure as well which will be discussed further with scheduling.

## 2019-03-20 IMAGING — CR DG CERVICAL SPINE 1V
1 series · 1 of 1 positions shown · non-contrast
Comparison: Cervical spine radiographs - 09/03/2016; 12/07/2014

CLINICAL DATA: ACDF C4-C5

EXAM:
CERVICAL SPINE 1 VIEW

[xtable lateral]
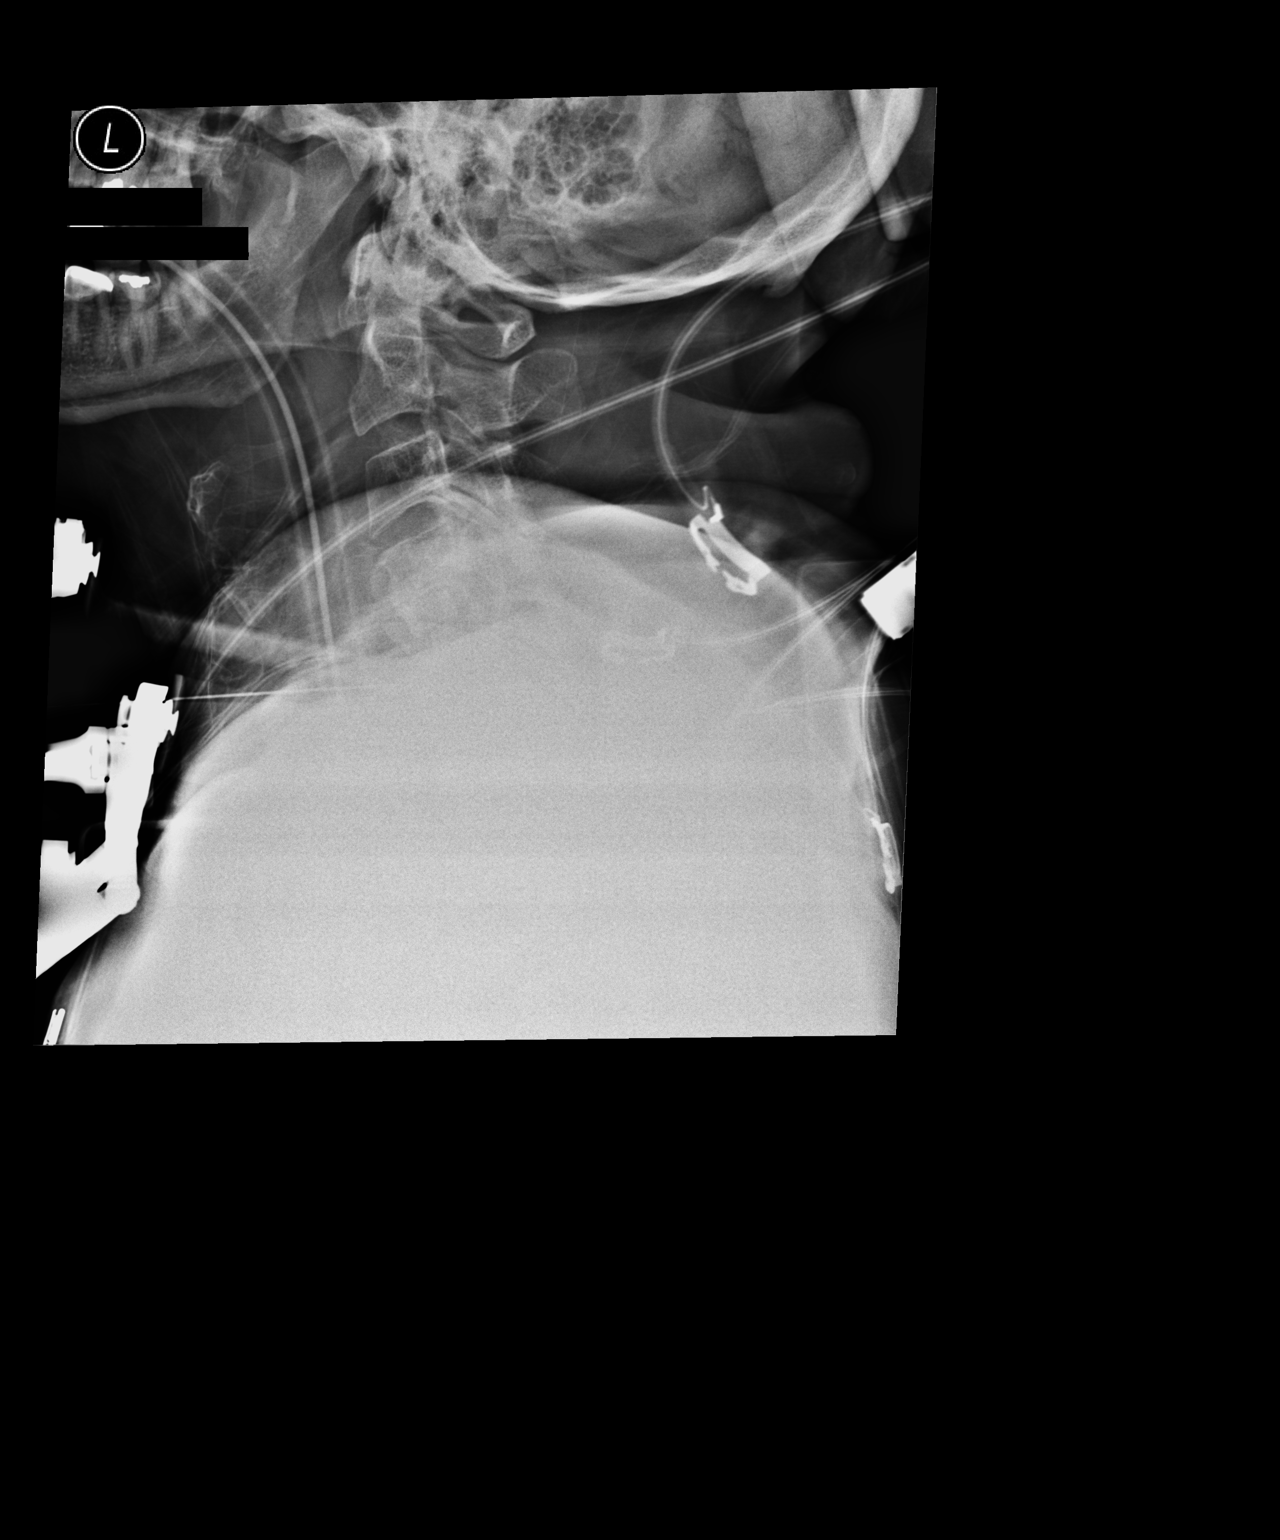

[1 of 1 positions shown; findings below may reference images not displayed]

FINDINGS: A single spot lateral projection intraoperative radiographic image
of the cervical spine is provided for review.

C1 to the superior endplate of C5 is imaged. There is obscuration of
the lower cervical spine as well as the cervicothoracic junction
secondary overlying osseous and soft tissue structures.

There has been apparent removal of previously noted C4-C5 ACDF
hardware.

A marking instrument projects over the soft tissues anterior to the
C5 vertebral body.

Endotracheal tube tip is excluded from view.
IMPRESSION: 1. Intraoperative localization of C5 as above.
2. Removal of previously noted C4-C5 ACDF hardware.

## 2020-01-25 MED ORDER — PEG-ELECTROLYTE SOLUTION 420 GRAM ORAL SOLUTION
Freq: Once | ORAL | 0 refills | 1.00000 days | Status: CP
Start: 2020-01-25 — End: 2020-01-26
  Filled 2020-01-26: qty 4000, 1d supply, fill #0

## 2020-01-26 MED FILL — PEG-ELECTROLYTE SOLUTION 420 GRAM ORAL SOLUTION: 1 days supply | Qty: 4000 | Fill #0 | Status: AC

## 2020-02-13 ENCOUNTER — Ambulatory Visit: Admit: 2020-02-13 | Discharge: 2020-02-13 | Payer: MEDICARE

## 2020-02-13 ENCOUNTER — Encounter: Admit: 2020-02-13 | Discharge: 2020-02-13 | Payer: MEDICARE | Attending: Anesthesiology | Primary: Anesthesiology

## 2020-02-13 MED ADMIN — fentaNYL (PF) (SUBLIMAZE) injection: INTRAVENOUS | @ 19:00:00 | Stop: 2020-02-13

## 2020-02-13 MED ADMIN — propofoL (DIPRIVAN) injection: INTRAVENOUS | @ 20:00:00 | Stop: 2020-02-13

## 2020-02-13 MED ADMIN — lactated Ringers infusion: 10 mL/h | INTRAVENOUS | @ 19:00:00 | Stop: 2020-02-13

## 2020-02-13 MED ADMIN — propofoL (DIPRIVAN) injection: INTRAVENOUS | @ 19:00:00 | Stop: 2020-02-13

## 2020-02-13 MED ADMIN — propofol (DIPRIVAN) infusion 10 mg/mL: INTRAVENOUS | @ 19:00:00 | Stop: 2020-02-13

## 2020-02-19 DIAGNOSIS — K5000 Crohn's disease of small intestine without complications: Principal | ICD-10-CM

## 2020-02-19 MED ORDER — BUDESONIDE DR - ER 3 MG CAPSULE,DELAYED,EXTENDED RELEASE
ORAL_CAPSULE | Freq: Every morning | ORAL | 2 refills | 30.00000 days | Status: CP
Start: 2020-02-19 — End: 2021-02-18

## 2020-02-21 MED ORDER — BUDESONIDE DR - ER 3 MG CAPSULE,DELAYED,EXTENDED RELEASE
ORAL_CAPSULE | Freq: Every morning | ORAL | 2 refills | 30.00000 days | Status: CP
Start: 2020-02-21 — End: 2021-02-20

## 2020-02-24 ENCOUNTER — Ambulatory Visit: Admit: 2020-02-24 | Discharge: 2020-02-25 | Payer: MEDICARE

## 2020-02-24 MED ADMIN — iohexoL (OMNIPAQUE) 350 mg iodine/mL solution 100 mL: 100 mL | INTRAVENOUS | @ 17:00:00 | Stop: 2020-02-24

## 2020-03-09 NOTE — Unmapped (Signed)
FOLLOW UP CONSULT NOTE - INFLAMMATORY BOWEL DISEASE  03/15/2020    Demographics:  Raymond Wright is a 64 y.o. year old male    Diagnosis:  Crohn's Disease  Disease onset (yr):  2011  Age at onset:  > 64yr old (A3)  Location:  Ileal (L1)  Behavior:  Nonstricturing,nonpenetrating (B1)  Current Tight Control Scenario:   Workup    Referring physician:   None Per Patient Referring  [address] [place],  [place] [address]          HPI / NOTE :     Interval Events:   1.  EGD /Colonoscopy 02/13/2020 - with very mild ileitis for 10cm only.  Normal colon and no upper GI tract Crohn's disease.   2.  Started entocort after colonoscopy    HPI:   Feels better with entocort.  BMs improving 1-2x/day. Not quite in remission, but definitely feeling better. Sometimes has loose stools - has occasional blowout symptoms. Has some urgency.  No blood or mucus.     We also discussed his GERD/reflux symptoms.  He is taking nexium 40mg  daily and inquires if he needs to continue.  He has tried briefly stopping before but notes that reflux recurs quite significantly after 2-3 days of stopping.     Weight down 7-10lb.     Wt Readings from Last 3 Encounters:   03/13/20 83 kg (183 lb)   02/13/20 83.9 kg (185 lb)   08/18/18 86.2 kg (190 lb)       Wt Readings from Last 6 Encounters:   03/13/20 83 kg (183 lb)   02/13/20 83.9 kg (185 lb)   08/18/18 86.2 kg (190 lb)   07/20/18 89.4 kg (197 lb)   06/29/18 89.4 kg (197 lb)   04/12/18 88.5 kg (195 lb)     Abdominal pain (0-10):  intermittent RLQ pain  BM a day: 2-3x/day  Consistency: varries  % of stools have blood: 0%  Nocturnal BM: no  Urgency:  no  Nausea/vomiting: some nausea  Smoking: yes, 1/2ppd  NSAIDS: avoids    Review of Systems:   Review of systems positive for: negative except as above.   Otherwise, the balance of 10 systems is negative.          IBD HISTORY:     Year of disease onset:  2011    Brief IBD Disease Course:    - 2011 - onset of abdominal pain and diarrhea.  Had MRI locally - told thickening of the bowel and then had a colonoscopy and given dx of ileal Crohn's disease.  Treated with Entocort.   - 2014 - September, hospitalized with severe diarrhea, was treated with Prednisone and then Humira.  Humira stopped in 2015 due to joint symptoms and (+)ANA. Then treated with Entocort + MTX and doing well since then.   - 2016 - continued on MTX 25mg  / week  - 2017 - self-discontinued MTX, no GI follow up  - 2019 - re-established GI care with [place] at [facility].  Restarted methotrexate.    - 2020 - February - started Stelara.  Primary non-response  - 2021 - EGD / Colonoscopy 01/2020     Endoscopy:      - Colonoscopy 10/14/13 - mild ileal Crohn's disease with aphthae, Two 3-59mm polyps in rectosigmoid colon (Path = tubular adenoma), otherwise normal colon.   - Colonoscopy 04/12/18 - poor prep - moderate ileitis for at least 10cm. Otherwise normal colon.    -  EGD 02/13/2020 - enlarged gastric folds (bx - benign), otherwise normal esophagus, stomach, duodenum.   - Colonoscopy 02/13/2020 - 4mm descending colon polyp.  10mm ascending colon.  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  Crohn's disease with moderate ileitis for at least 10cm - similar to Jan 2020 colonoscopy. SES-CD = 4.   PATH = ileum - mildly active chronic enteritis.  No granulomas.  Polyps - sessile serrated (ascending), adenoma (descending).     Imaging:    - CT A/P 10/22/13 - normal bowel with no signs of active inflammatino.   - CT-E 03/24/18 - inflammation and thickening of terminal ileum consistent with Crohn's flare. Bilateral renal stones.     Prior IBD medications (type, dose, duration, response):  ? 5-ASAs  x Oral corticosteroids - Prednisone, Entocort  ? Intravenous corticosteroids  ? Antibiotics  ? Thiopurines  x Methotrexate - started April 2015.  Re-challenge 2019 - no improvement  x Anti-TNF therapies - Humira 2014 - then developed joint pains and (+)ANA so it was stopped  ? Anti-Integrin therapies  ? Cyclosporine  ? Clinical trial medication  ? Other (Please specify):    Extraintestinal manifestations:   -joint pains affecting: n  -eye: n  -skin: n  -oral ulcers :  n  -blood clots: n  -PSC: n  -other: n          Past Medical History:   Past medical history:   Past Medical History:   Diagnosis Date   ??? AC (acromioclavicular) joint bone spurs     in back   ??? Bulging discs    ??? Crohn's disease (CMS-HCC)    ??? Degenerative disc disease    ??? GERD (gastroesophageal reflux disease)    ??? Hypertension    ??? Stroke (CMS-HCC)      Past surgical history:   Past Surgical History:   Procedure Laterality Date   ??? CERVICAL FUSION  2016   ??? KNEE ARTHROSCOPY Right    ??? PR COLONOSCOPY W/BIOPSY SINGLE/MULTIPLE N/A 04/12/2018    Procedure: COLONOSCOPY, FLEXIBLE, PROXIMAL TO SPLENIC FLEXURE; WITH BIOPSY, SINGLE OR MULTIPLE;  Surgeon: [name], MD;  [facility];  Service: Gastroenterology   ??? PR COLONOSCOPY W/BIOPSY SINGLE/MULTIPLE Left 02/13/2020    Procedure: COLONOSCOPY, FLEXIBLE, PROXIMAL TO SPLENIC FLEXURE; WITH BIOPSY, SINGLE OR MULTIPLE;  Surgeon: [name], MD;  Location: [facility];  Service: Gastroenterology   ??? PR COLSC FLX W/RMVL OF TUMOR POLYP LESION SNARE TQ N/A 10/14/2013    Procedure: COLONOSCOPY FLEX; W/REMOV TUMOR/LES BY SNARE;  Surgeon: Theadore Nan, MD;  Location: [facility];  Service: Gastroenterology   ??? PR COLSC FLX W/RMVL OF TUMOR POLYP LESION SNARE TQ Left 02/13/2020    Procedure: COLONOSCOPY FLEX; W/REMOV TUMOR/LES BY SNARE;  Surgeon: [name], MD;  Location: [facility];  Service: Gastroenterology   ??? PR UPPER GI ENDOSCOPY,BIOPSY Left 02/13/2020    Procedure: UGI ENDOSCOPY; WITH BIOPSY, SINGLE OR MULTIPLE;  Surgeon: [name], MD;  Location: [facility];  Service: Gastroenterology     Family history:   Family History   Problem Relation Age of Onset   ??? Cancer Mother         breast   ??? Crohn's disease Neg Hx    ??? Ulcerative colitis Neg Hx    ??? Colorectal Cancer Neg Hx      Social history:   Social History     Socioeconomic History   ???  Marital status: Married     Spouse name: None   ??? Number of children: None   ??? Years of education: None   ??? Highest education level: None   Occupational History   ??? None   Tobacco Use   ??? Smoking status: Current Every Day Smoker     Packs/day: 0.25     Types: Cigarettes     Start date: 06/2009   ??? Smokeless tobacco: Never Used   Vaping Use   ??? Vaping Use: Never used   Substance and Sexual Activity   ??? 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 Drug use: No   ??? Sexual activity: None   Other Topics Concern   ??? None   Social History Narrative   ??? None     Social Determinants of Health     Financial Resource Strain:    ??? Difficulty of Paying Living Expenses:    Food Insecurity:    ??? Worried About Programme researcher, broadcasting/film/video in the Last Year:    ??? Barista in the Last Year:    Transportation Needs:    ??? Freight forwarder (Medical):    ??? Lack of Transportation (Non-Medical):    Physical Activity:    ??? Days of Exercise per Week:    ??? Minutes of Exercise per Session:    Stress:    ??? Feeling of Stress :    Social Connections:    ??? Frequency of Communication with Friends and Family:    ??? Frequency of Social Gatherings with Friends and Family:    ??? Attends Religious Services:    ??? Database administrator or Organizations:    ??? Attends Banker Meetings:    ??? Marital Status:            Allergies:     Allergies   Allergen Reactions   ??? Triamterene Hives     Pt was given IV, and caused a breakout   ??? Ticarcillin-Clavulanate Rash     Timentin             Medications:     Current Outpatient Medications   Medication Sig Dispense Refill   ??? aspirin 325 MG tablet Take 325 mg by mouth daily.     ??? BACLOFEN ORAL Take 5 mg by mouth nightly.     ??? budesonide (ENTOCORT EC) 3 mg 24 hr capsule Take 3 capsules (9 mg total) by mouth every morning. 90 capsule 2   ??? fish oil-omega-3 fatty acids 300-1,000 mg capsule Take 2 g by mouth daily.     ??? gabapentin (NEURONTIN) 300 MG capsule Take 300 mg by mouth Three (3) times a day.     ??? HYDROcodone-acetaminophen (NORCO) 7.5-325 mg per tablet Take 3 tablets by mouth daily.      ??? lisinopril (PRINIVIL,ZESTRIL) 20 MG tablet Take 10 mg by mouth every other day. prn      ??? NEXIUM 40 mg capsule Take 40 mg by mouth every morning before breakfast.      ??? rosuvastatin (CRESTOR) 20 MG tablet Take 20 mg by mouth daily.     ??? tamsulosin (FLOMAX) 0.4 mg capsule        No current facility-administered medications for this visit.           Physical Exam:   BP 161/93  - Pulse 65  - Temp 36.4 ??C (  97.5 ??F)  - Wt 83 kg (183 lb)  - BMI 27.02 kg/m??     GEN: middle aged male in no apparent distress, appears comfortable on exam  HEENT: OP clear with no erythema, lesions, exudate, mucous membranes moist  NECK: Supple, no lymphadenopathy  LUNGS: CTAB, no wheezes, rales, or rhonchi  CV: S1/S2, RRR, no murmurs  ABD: Soft, mildly tender, no rebound/guarding, nondistended, normoactive bowel sounds, no appreciable organomegaly  Extremities: no cyanosis, clubbing or edema, normal gait  Psych: affect appropriate, A&O x3  SKIN: no visible lesions on face, neck, arms, abdomen          Labs, Data & Indices:     Lab Review:   Lab Results   Component Value Date    WBC 6.6 03/13/2020    WBC 6.4 08/29/2014    RBC 4.30 (L) 03/13/2020    RBC 4.25 (L) 08/29/2014    HGB 13.8 03/13/2020    HGB 14.2 08/29/2014     Lab Results   Component Value Date    AST 17 03/13/2020    AST 18 (L) 08/29/2014    ALT 18 03/13/2020    ALT 27 08/29/2014    BUN 14 03/13/2020    BUN 19 10/04/2013    Creatinine Whole Blood, POC 1.1 02/24/2020    Creatinine 0.86 03/13/2020    CO2 26.6 03/13/2020    CO2 25 10/04/2013    Albumin 4.0 03/13/2020    Albumin 4.3 08/29/2014    Calcium 10.1 03/13/2020    Calcium 9.8 10/04/2013     Lab Results   Component Value Date    TSH 1.52 10/04/2013 ............................................................................................................................................  ORDERS THIS VISIT:       Diagnosis ICD-10-CM Associated Orders   1. Crohn's disease of small intestine without complication (CMS-HCC)  K50.00 CBC     Comprehensive Metabolic Panel     C-reactive protein     Sedimentation Rate     Ferritin     Iron & TIBC     Vitamin B12 Level     Quantiferon TB Gold Plus     Hepatitis B Surface Antigen     Hepatitis B Surface Antibody     Hepatitis B Core Antibody, total     Hepatitis B Core Antibody, total     Hepatitis B Surface Antibody     Hepatitis B Surface Antigen     Quantiferon TB Gold Plus     Vitamin B12 Level     Iron & TIBC     Ferritin     Sedimentation Rate     C-reactive protein     Comprehensive Metabolic Panel     CBC   2. Need for hepatitis B screening test  Z11.59 Hepatitis B Surface Antigen     Hepatitis B Surface Antibody     Hepatitis B Core Antibody, total     Hepatitis B Core Antibody, total     Hepatitis B Surface Antibody     Hepatitis B Surface Antigen   3. Gastroesophageal reflux disease without esophagitis  K21.9             Assessment & Recommendations:   Disease state:  Clinical remission on immune modulator     Oisin Yoakum is a 64 y.o. male with a hx of ileal Crohn's disease dating back to ~2011.  He was previously on humira but stopped due to worsening joint pains and (+)ANA. He was on MTX from ~2015 - 2017, but he self discontinued this  medication since he was feeling well.  He developed inflammatory ileal Crohn's flare in fall 2019.  We restarted Entocort + MTX but he had little improvement.  Hence, we changed to [facility] in Feb 2020.  He has had no improvement in symptoms after more than 1 year on Stelara.  Colonoscopy shows mild ileal Crohn's and he has had notable improvement with Budesonide.      I had a detailed discussion with him and his wife about various medication options, including Remicade and Entyvio.  We discussed pros and cons of each option, including expected efficacy based on current data, risks and side effects including immune suppression risk, lymphoma, skin cancer and idiosyncratic drug reactions.  We decided to start Vedolizumab with a slow Budesonide taper.     PLAN:  1.  We will switch to Entyvio infusions.   2.  Baseline lab work today  3.  Continue Budesonide 3 tablets daily for 1 month, then drop to 2 tablets for 1 month, then 1 tablet for 1 month, then stop  4.  GERD:  He will try tapering to Omeprazole to 20mg  daily.  If he does well, we could stop in the future.   5.  Follow up in 3-4 months  6.  I would recommend a COVID Vaccine booster. Recommend getting a flu shot this fall.    IBD health maintenance:  Influenza vaccine: 03/12/18  Pneumonia vaccine:  Prevnar 08/10/15, Pneumovax ~2012, Pneumovax 03/12/18  COVID19 Vaccine:  2 shots spring 2021  Hepatitis B: sAg neg 03/12/18  TB testing: Quantiferon neg 03/12/18  Chickenpox/Shingles history:  Had chickenpox, needs Shingrix  Bone denistometry:   Derm appointment:  Last colonoscopy:  2019  --------------------------------------------  I personally spent 42 minutes face-to-face and non-face-to-face in the care of this patient, which includes all pre, intra, and post visit time on the date of service.  In particular, we discussed various medical therapy options as discussed above.     Author: [name] 03/15/2020 8:05 PM    [name], MD  Clinical Assistant Professor of Medicine  Division of Gastroenterology & Hepatology  [place] [facility]  ============================================

## 2020-03-13 ENCOUNTER — Ambulatory Visit: Admit: 2020-03-13 | Discharge: 2020-03-13 | Payer: MEDICARE

## 2020-03-13 DIAGNOSIS — Z1159 Encounter for screening for other viral diseases: Principal | ICD-10-CM

## 2020-03-13 DIAGNOSIS — K5000 Crohn's disease of small intestine without complications: Principal | ICD-10-CM

## 2020-03-13 LAB — CBC
HEMATOCRIT: 40.8 % (ref 38.0–50.0)
HEMOGLOBIN: 13.8 g/dL (ref 13.5–17.5)
MEAN CORPUSCULAR HEMOGLOBIN CONC: 33.9 g/dL (ref 30.0–36.0)
MEAN CORPUSCULAR HEMOGLOBIN: 32.2 pg (ref 26.0–34.0)
MEAN CORPUSCULAR VOLUME: 94.9 fL (ref 81.0–95.0)
MEAN PLATELET VOLUME: 7.8 fL (ref 7.0–10.0)
PLATELET COUNT: 274 10*9/L (ref 150–450)
RED CELL DISTRIBUTION WIDTH: 13.3 % (ref 12.0–15.0)
WBC ADJUSTED: 6.6 10*9/L (ref 3.5–10.5)

## 2020-03-13 LAB — ERYTHROCYTE SEDIMENTATION RATE: Lab: 13

## 2020-03-13 LAB — TOTAL IRON BINDING CAPACITY: Iron binding capacity:MCnc:Pt:Ser/Plas:Qn:: 298

## 2020-03-13 LAB — COMPREHENSIVE METABOLIC PANEL
ALBUMIN: 4 g/dL (ref 3.4–5.0)
ALKALINE PHOSPHATASE: 78 U/L (ref 46–116)
ALT (SGPT): 18 U/L (ref 10–49)
ANION GAP: 7 mmol/L (ref 5–14)
AST (SGOT): 17 U/L (ref <=34)
BILIRUBIN TOTAL: 0.4 mg/dL (ref 0.3–1.2)
BLOOD UREA NITROGEN: 14 mg/dL (ref 9–23)
BUN / CREAT RATIO: 16
CALCIUM: 10.1 mg/dL (ref 8.7–10.4)
CHLORIDE: 107 mmol/L (ref 98–107)
CO2: 26.6 mmol/L (ref 20.0–31.0)
CREATININE: 0.86 mg/dL
EGFR CKD-EPI AA MALE: >90 mL/min/{1.73_m2} (ref >=60)
GLUCOSE RANDOM: 103 mg/dL (ref 70–179)
POTASSIUM: 3.6 mmol/L (ref 3.4–4.5)
PROTEIN TOTAL: 6.9 g/dL (ref 5.7–8.2)
SODIUM: 141 mmol/L (ref 135–145)

## 2020-03-13 LAB — GLUCOSE RANDOM: Glucose:MCnc:Pt:Ser/Plas:Qn:: 103

## 2020-03-13 LAB — C-REACTIVE PROTEIN: C reactive protein:MCnc:Pt:Ser/Plas:Qn:: <4

## 2020-03-13 LAB — MEAN CORPUSCULAR HEMOGLOBIN CONC: Erythrocyte mean corpuscular hemoglobin concentration:MCnc:Pt:RBC:Qn:Automated count: 33.9

## 2020-03-13 LAB — IRON & TIBC: TOTAL IRON BINDING CAPACITY: 298 ug/dL (ref 250–425)

## 2020-03-13 LAB — FERRITIN: Ferritin:MCnc:Pt:Ser/Plas:Qn:: 90.7

## 2020-03-13 LAB — VITAMIN B12: VITAMIN B-12: 355 pg/mL (ref 211–911)

## 2020-03-13 LAB — VITAMIN B-12: Cobalamins:MCnc:Pt:Ser/Plas:Qn:: 355

## 2020-03-13 NOTE — Unmapped (Addendum)
Raymond Wright it was a pleasure seeing you today.  Here is a summary/wrap up from today's visit:     1.  We will switch to Entyvio infusions.   2.   Baseline lab work today  3.   Continue Budesonide 3 tablets daily for 1 month, then drop to 2 tablets for 1 month, then 1 tablet for 1 month, then stop  4.   Drop Omeprazole (prilosec) to 20mg  daily  5.  Follow up in 3-4 months  6.  I would recommend a COVID Vaccine booster. Recommend getting a flu shot this fall.  If any trouble or symptoms, do not hesitate to call.     [name], MD  Assistant Professor of Medicine  Division of Gastroenterology & Hepatology  [place] [facility] [place] [facility]            EXPECTATIONS FOR PATIENT FOLLOW UP AND COMMUNICATION:  -- Follow up Appointments:  Crohn's disease and Ulcerative colitis are serious chronic inflammatory diseases which require close monitoring.  I typically expect to see patients for follow up visits at least every 6 months (or more often if you are having a flare, starting new therapy, etc).  In select cases, patients who are only on aminosalicylates / mesalamine, may be seen once per year for follow ups.      -- Communication:  if you have any questions or concerns, you can communicate with [place] via phone (contact information below) or via myChart.  Note that phone messages are given higher priority and triaged first.  We typically respond to myChart messages within 3 business days (occasionally longer during holidays or vacation times).  For urgent issues, please contact us via phone.      IBD NURSE COORDINATOR CONTACT ??? Neta Mends, RN BSN     Phone: [phone] (direct line)      Fax: [phone]  * For urgent medical concerns after hours or on weekends and holidays, call [phone] and ask to speak to the GI Fellow on call.    * If you have a GI medical question or GI symptoms and would like to speak to your provider's healthcare team, please contact Neta Mends, RN (contact information above) OR you can send the GI healthcare team a message through MyChart at [email or web address]    APPOINTMENT SCHEDULING FOR GI CLINIC AND GI PROCEDURES:  RADIOLOGY - to schedule imaging ordered, please call [phone] opt 1   GI MEDICINE CLINIC  [phone] option 1   GI PROCEDURES         [phone] option 2   *To schedule, reschedule, or cancel your GI appointment, please call [phone]. If you are unable to come to an appointment, please notify us as soon as possible, preferably 24 hours in advance. Doing so may allow other patients with urgent needs to be scheduled in a cancelled appointment slot.     TEST RESULTS   If you have a MyChart account, your new results and a provider message will be sent to you through your MyChart account at [email or web address]. For results that require follow-up, a member of your healthcare team will also contact you directly.    PRESCRIPTION REFILL REQUESTS  To request prescription refills, please contact your pharmacy or send your healthcare team a message through your MyChart account at [email or web address]  RECORD REQUESTS  For questions related to medical records, please call Medical Records Release of Information at [phone]  FINANCIAL COUNSELOR   For billing and other financial questions/needs ???  please contact Mee Hives at [phone]. If you need to leave a message, please be sure to leave your full name, date of birth or MR#, best call back # and reason for call.    For educational material and resources:  [email or web address]  [place] COVID19 Vaccine Information: [email or web address]  ===============================================================

## 2020-03-14 DIAGNOSIS — K50914 Crohn's disease, unspecified, with abscess: Principal | ICD-10-CM

## 2020-03-14 LAB — HEPATITIS B SURFACE ANTIBODY: Hepatitis B virus surface Ab:PrThr:Pt:Ser:Ord:: NONREACTIVE

## 2020-03-14 LAB — HEPATITIS B CORE TOTAL ANTIBODY: Hepatitis B virus core Ab:PrThr:Pt:Ser/Plas:Ord:IA: NONREACTIVE

## 2020-03-14 LAB — HEPATITIS B SURFACE ANTIGEN: Hepatitis B virus surface Ag:PrThr:Pt:Ser:Ord:: NONREACTIVE

## 2020-03-15 DIAGNOSIS — K50914 Crohn's disease, unspecified, with abscess: Principal | ICD-10-CM

## 2020-03-19 LAB — TB AG2 VALUE: Lab: 0.08

## 2020-03-19 LAB — TB MITOGEN VALUE: Lab: 7.08

## 2020-03-21 LAB — QUANTIFERON TB GOLD PLUS
QUANTIFERON ANTIGEN 1 MINUS NIL: 0.05 [IU]/mL
QUANTIFERON ANTIGEN 2 MINUS NIL: 0.02 [IU]/mL
QUANTIFERON MITOGEN: 2.38 [IU]/mL
QUANTIFERON TB NIL VALUE: 0.02 [IU]/mL

## 2020-03-21 LAB — QUANTIFERON ANTIGEN 1 MINUS NIL: Lab: 0.05

## 2020-03-21 LAB — TB AG2: TB AG2 VALUE: 0.04

## 2020-03-21 LAB — TB NIL VALUE: Lab: 0.02

## 2020-03-21 LAB — TB AG1 VALUE: Lab: 0.07

## 2020-03-21 LAB — TB AG1: TB AG1 VALUE: 0.07

## 2020-03-29 ENCOUNTER — Ambulatory Visit: Admit: 2020-03-29 | Discharge: 2020-03-30 | Payer: MEDICARE

## 2020-03-29 LAB — CBC W/ AUTO DIFF
BASOPHILS ABSOLUTE COUNT: 0.1 10*9/L (ref 0.0–0.1)
BASOPHILS RELATIVE PERCENT: 0.9 %
EOSINOPHILS ABSOLUTE COUNT: 0.4 10*9/L (ref 0.0–0.7)
EOSINOPHILS RELATIVE PERCENT: 5.2 %
HEMATOCRIT: 40.5 % (ref 38.0–50.0)
HEMOGLOBIN: 13.9 g/dL (ref 13.5–17.5)
LYMPHOCYTES ABSOLUTE COUNT: 1.7 10*9/L (ref 0.7–4.0)
LYMPHOCYTES RELATIVE PERCENT: 24.5 %
MEAN CORPUSCULAR HEMOGLOBIN CONC: 34.4 g/dL (ref 30.0–36.0)
MEAN CORPUSCULAR HEMOGLOBIN: 33 pg (ref 26.0–34.0)
MEAN PLATELET VOLUME: 7.8 fL (ref 7.0–10.0)
MONOCYTES ABSOLUTE COUNT: 0.6 10*9/L (ref 0.1–1.0)
MONOCYTES RELATIVE PERCENT: 8.6 %
NEUTROPHILS ABSOLUTE COUNT: 4.2 10*9/L (ref 1.7–7.7)
NEUTROPHILS RELATIVE PERCENT: 60.8 %
RED BLOOD CELL COUNT: 4.22 10*12/L — ABNORMAL LOW (ref 4.32–5.72)
RED CELL DISTRIBUTION WIDTH: 13.5 % (ref 12.0–15.0)

## 2020-03-29 LAB — ALT (SGPT): Alanine aminotransferase:CCnc:Pt:Ser/Plas:Qn:: 15

## 2020-03-29 LAB — AST (SGOT): Aspartate aminotransferase:CCnc:Pt:Ser/Plas:Qn:: 20

## 2020-03-29 LAB — NEUTROPHILS ABSOLUTE COUNT: Neutrophils:NCnc:Pt:Bld:Qn:Automated count: 4.2

## 2020-03-29 LAB — C-REACTIVE PROTEIN: C reactive protein:MCnc:Pt:Ser/Plas:Qn:: <4

## 2020-03-29 MED ADMIN — vedolizumab (ENTYVIO) 300 mg in sodium chloride (NS) 0.9 % 250 mL IVPB: 300 mg | INTRAVENOUS | @ 15:00:00 | Stop: 2020-03-29

## 2020-03-29 NOTE — Unmapped (Signed)
Pt presents for Entyvio infusion.  Pt denies any recent infections or changes to home meds, VSS. IV placed, labs drawn, no premeds administered.  Pt aware of potential reaction/side effects, call bell within reach.  1106  Entyvio 300 mg in 250  ml started     1144 Infusion complete. Pt tolerated without complication, VSS.  IV flushed per policy and d/c'd, gauze and coban applied. 1402  Pt left clinic in no acute distress; elected not to wait 30 min

## 2020-04-12 ENCOUNTER — Ambulatory Visit: Admit: 2020-04-12 | Discharge: 2020-04-13 | Payer: MEDICARE

## 2020-04-12 DIAGNOSIS — K50914 Crohn's disease, unspecified, with abscess: Principal | ICD-10-CM

## 2020-04-12 LAB — CBC W/ AUTO DIFF
BASOPHILS RELATIVE PERCENT: 1.2 %
EOSINOPHILS ABSOLUTE COUNT: 0.3 10*9/L (ref 0.0–0.7)
EOSINOPHILS RELATIVE PERCENT: 5.4 %
HEMATOCRIT: 39.2 % (ref 38.0–50.0)
HEMOGLOBIN: 13.5 g/dL (ref 13.5–17.5)
LYMPHOCYTES ABSOLUTE COUNT: 1.3 10*9/L (ref 0.7–4.0)
MEAN CORPUSCULAR HEMOGLOBIN CONC: 34.5 g/dL (ref 30.0–36.0)
MEAN CORPUSCULAR HEMOGLOBIN: 33 pg (ref 26.0–34.0)
MEAN CORPUSCULAR VOLUME: 95.6 fL — ABNORMAL HIGH (ref 81.0–95.0)
MEAN PLATELET VOLUME: 7.3 fL (ref 7.0–10.0)
MONOCYTES ABSOLUTE COUNT: 0.4 10*9/L (ref 0.1–1.0)
MONOCYTES RELATIVE PERCENT: 7.2 %
NEUTROPHILS ABSOLUTE COUNT: 3.4 10*9/L (ref 1.7–7.7)
NEUTROPHILS RELATIVE PERCENT: 62.4 %
PLATELET COUNT: 238 10*9/L (ref 150–450)
RED CELL DISTRIBUTION WIDTH: 13.2 % (ref 12.0–15.0)
WBC ADJUSTED: 5.4 10*9/L (ref 3.5–10.5)

## 2020-04-12 LAB — AST (SGOT): Aspartate aminotransferase:CCnc:Pt:Ser/Plas:Qn:: 21

## 2020-04-12 LAB — C-REACTIVE PROTEIN: C reactive protein:MCnc:Pt:Ser/Plas:Qn:: <4

## 2020-04-12 LAB — MEAN PLATELET VOLUME: Platelet mean volume:EntVol:Pt:Bld:Qn:Automated count: 7.3

## 2020-04-12 LAB — ALT (SGPT): Alanine aminotransferase:CCnc:Pt:Ser/Plas:Qn:: 13

## 2020-04-12 MED ADMIN — vedolizumab (ENTYVIO) 300 mg in sodium chloride (NS) 0.9 % 250 mL IVPB: 300 mg | INTRAVENOUS | @ 15:00:00 | Stop: 2020-04-12

## 2020-04-12 NOTE — Unmapped (Signed)
Pt presents for Entyvio infusion.  Pt denies any recent infections or changes to home meds, VSS. 24G IV placed in RAC,, labs drawn. Patient states that he thinks he may have a sinus infection and is concerned about antibiotic use while on Entyvio. Per GI team, antibiotic use is fine and patient should see PCP for evaluation.  Pt aware of potential reaction/side effects, call bell within reach.     1111 Entyvio 300 mg started  1141 Infusion complete. Pt tolerated without complication, VSS.  IV flushed per policy and d/c'd, gauze and coban applied.  Pt left clinic in no acute distress.

## 2020-05-10 ENCOUNTER — Ambulatory Visit: Admit: 2020-05-10 | Discharge: 2020-05-11 | Payer: MEDICARE

## 2020-05-10 DIAGNOSIS — K50914 Crohn's disease, unspecified, with abscess: Principal | ICD-10-CM

## 2020-05-10 LAB — CBC W/ AUTO DIFF
BASOPHILS ABSOLUTE COUNT: 0.1 10*9/L (ref 0.0–0.1)
BASOPHILS RELATIVE PERCENT: 1.1 %
EOSINOPHILS ABSOLUTE COUNT: 0.4 10*9/L (ref 0.0–0.7)
EOSINOPHILS RELATIVE PERCENT: 5.4 %
HEMATOCRIT: 41 % (ref 38.0–50.0)
HEMOGLOBIN: 14 g/dL (ref 13.5–17.5)
LYMPHOCYTES ABSOLUTE COUNT: 1.8 10*9/L (ref 0.7–4.0)
LYMPHOCYTES RELATIVE PERCENT: 25.6 %
MEAN CORPUSCULAR HEMOGLOBIN CONC: 34.1 g/dL (ref 30.0–36.0)
MEAN CORPUSCULAR HEMOGLOBIN: 32.6 pg (ref 26.0–34.0)
MEAN CORPUSCULAR VOLUME: 95.6 fL — ABNORMAL HIGH (ref 81.0–95.0)
MEAN PLATELET VOLUME: 7.4 fL (ref 7.0–10.0)
MONOCYTES ABSOLUTE COUNT: 0.6 10*9/L (ref 0.1–1.0)
MONOCYTES RELATIVE PERCENT: 8.6 %
NEUTROPHILS ABSOLUTE COUNT: 4.1 10*9/L (ref 1.7–7.7)
NEUTROPHILS RELATIVE PERCENT: 59.3 %
PLATELET COUNT: 237 10*9/L (ref 150–450)
RED BLOOD CELL COUNT: 4.29 10*12/L — ABNORMAL LOW (ref 4.32–5.72)
RED CELL DISTRIBUTION WIDTH: 13.2 % (ref 12.0–15.0)
WBC ADJUSTED: 7 10*9/L (ref 3.5–10.5)

## 2020-05-10 LAB — AST: AST (SGOT): 15 U/L (ref <=34)

## 2020-05-10 LAB — ALT: ALT (SGPT): 13 U/L (ref 10–49)

## 2020-05-10 LAB — C-REACTIVE PROTEIN: C-REACTIVE PROTEIN: <4 mg/L (ref <=10.0)

## 2020-05-10 MED ADMIN — vedolizumab (ENTYVIO) 300 mg in sodium chloride (NS) 0.9 % 250 mL IVPB: 300 mg | INTRAVENOUS | @ 16:00:00 | Stop: 2020-05-10

## 2020-05-10 NOTE — Unmapped (Signed)
Pt presents for Entyvio infusion.   ??Pt denies any recent infections or changes to home meds, VSS. 24G IV placed in RAC,, labs drawn.   ??Pt aware of potential reaction/side effects, call bell within reach.  ??  @ 1055 am   Entyvio 300 mg started  @ 1128 am  Infusion complete. Pt tolerated without complication  , VSS. ??IV flushed per policy and d/c'd, gauze and coban applied.   ??Pt left clinic in no acute distress.

## 2020-06-08 NOTE — Unmapped (Signed)
FOLLOW UP CONSULT NOTE - INFLAMMATORY BOWEL DISEASE  06/12/2020    Demographics:  Raymond Wright is a 64 y.o. year old male    Diagnosis:  Crohn's Disease  Disease onset (yr):  2011  Age at onset:  > 81yr old (A3)  Location:  Ileal (L1)  Behavior:  Nonstricturing,nonpenetrating (B1)  Current Tight Control Scenario:   Workup    Referring physician:   None Per Patient Referring  [address] [place],  [place] [address]          HPI / NOTE :     Interval Events:   1.  Last visit with me September 2021.  We decided to proceed with a budesonide taper and start vedolizumab for his Crohn's disease.  He has received 3 doses so far.  We also continued Nexium at a reduced dose of 20 mg daily for his acid reflux.  2.  Reviewed recent labs from October and November 2021, CBC, AST, ALT, CRP all unremarkable.    HPI:   Has been off Entocort 1 week ago - no change / worsening symptoms since stopping Entocort.  Abdominal pain is still present, but mild.  Bowels are a little better, still has some mild symptoms. Has only one blowout episode which is better than before.  Stools are more formed. BMs are once or sometimes twice per day. Hhe ate cheese recently and was able to handle this better - didn't have diarrhea after cheese, which is an improvement.  Has been trying to cut back on sugar.     He does report a feeling of leg heaviness since starting Entyvio.     Still has some fatigue. Still smoking cigarettes ~1/2ppd, we did discuss this briefly.     Abdominal pain (0-10):  intermittent RLQ pain, less than prior  BM a day: 1-2x/day (was 3x/day before Entyvio).   Consistency: more formed, sometimes loose  % of stools have blood: 0%  Nocturnal BM: no  Urgency:  no  Nausea/vomiting: some nausea  Smoking: yes, 1/2ppd  NSAIDS: avoids    Review of Systems:   Review of systems positive for: negative except as above.   Otherwise, the balance of 10 systems is negative.          IBD HISTORY:     Year of disease onset:  2011    Brief IBD Disease Course:    - 2011 - onset of abdominal pain and diarrhea.  Had MRI locally - told thickening of the bowel and then had a colonoscopy and given dx of ileal Crohn's disease.  Treated with Entocort.   - 2014 - September, hospitalized with severe diarrhea, was treated with Prednisone and then Humira.  Humira stopped in 2015 due to joint symptoms and (+)ANA. Then treated with Entocort + MTX and doing well since then.   - 2016 - continued on MTX 25mg  / week  - 2017 - self-discontinued MTX, no GI follow up  - 2019 - re-established GI care with [place] at [facility].  Restarted methotrexate.    - 2020 - February - started Stelara.  Primary non-response  - 2021 - EGD / Colonoscopy 01/2020 - mild ileal disease.  Good response to Entocort, started Vedolizumab 02/2020.      Endoscopy:      - Colonoscopy 10/14/13 - mild ileal Crohn's disease with aphthae, Two 3-58mm polyps in rectosigmoid colon (Path = tubular adenoma), otherwise normal colon.   - Colonoscopy 04/12/18 - poor prep - moderate  ileitis for at least 10cm. Otherwise normal colon.    - EGD 02/13/2020 - enlarged gastric folds (bx - benign), otherwise normal esophagus, stomach, duodenum.   - Colonoscopy 02/13/2020 - 4mm descending colon polyp.  10mm ascending colon.  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  Crohn's disease with moderate ileitis for at least 10cm - similar to Jan 2020 colonoscopy. SES-CD = 4.   PATH = ileum - mildly active chronic enteritis.  No granulomas.  Polyps - sessile serrated (ascending), adenoma (descending).     Imaging:    - CT A/P 10/22/13 - normal bowel with no signs of active inflammatino.   - CT-E 03/24/18 - inflammation and thickening of terminal ileum consistent with Crohn's flare. Bilateral renal stones.     Prior IBD medications (type, dose, duration, response):  ? 5-ASAs  x Oral corticosteroids - Prednisone, Entocort  ? Intravenous corticosteroids  ? Antibiotics  ? Thiopurines  x Methotrexate - started April 2015.  Re-challenge 2019 - no improvement  x Anti-TNF therapies - Humira 2014 - then developed joint pains and (+)ANA so it was stopped  x Anti-Interleukin therapies - Stelara 07/2018 - 01/2020 - primary non-response.   x Anti-Integrin therapies - Vedolizumab 02/2020  ? Cyclosporine  ? Clinical trial medication  ? Other (Please specify):    Extraintestinal manifestations:   -joint pains affecting: n  -eye: n  -skin: n  -oral ulcers :  n  -blood clots: n  -PSC: n  -other: n          Past Medical History:   Past medical history:   Past Medical History:   Diagnosis Date   ??? AC (acromioclavicular) joint bone spurs     in back   ??? Bulging discs    ??? Crohn's disease (CMS-HCC)    ??? Degenerative disc disease    ??? GERD (gastroesophageal reflux disease)    ??? Hypertension    ??? Stroke (CMS-HCC)      Past surgical history:   Past Surgical History:   Procedure Laterality Date   ??? CERVICAL FUSION  2016   ??? KNEE ARTHROSCOPY Right    ??? PR COLONOSCOPY W/BIOPSY SINGLE/MULTIPLE N/A 04/12/2018    Procedure: COLONOSCOPY, FLEXIBLE, PROXIMAL TO SPLENIC FLEXURE; WITH BIOPSY, SINGLE OR MULTIPLE;  Surgeon: [name], MD;  Location: [facility];  Service: Gastroenterology   ??? PR COLONOSCOPY W/BIOPSY SINGLE/MULTIPLE Left 02/13/2020    Procedure: COLONOSCOPY, FLEXIBLE, PROXIMAL TO SPLENIC FLEXURE; WITH BIOPSY, SINGLE OR MULTIPLE;  Surgeon: [name], MD;  Location: [facility];  Service: Gastroenterology   ??? PR COLSC FLX W/RMVL OF TUMOR POLYP LESION SNARE TQ N/A 10/14/2013    Procedure: COLONOSCOPY FLEX; W/REMOV TUMOR/LES BY SNARE;  Surgeon: Theadore Nan, MD;  Location: [facility];  Service: Gastroenterology   ??? PR COLSC FLX W/RMVL OF TUMOR POLYP LESION SNARE TQ Left 02/13/2020    Procedure: COLONOSCOPY FLEX; W/REMOV TUMOR/LES BY SNARE;  Surgeon: [name], MD;  Location: [facility];  Service: Gastroenterology   ??? PR UPPER GI ENDOSCOPY,BIOPSY Left 02/13/2020    Procedure: UGI ENDOSCOPY; WITH BIOPSY, SINGLE OR MULTIPLE;  Surgeon: [name], MD;  Location: [facility];  Service: Gastroenterology     Family history:   Family History   Problem Relation Age of Onset   ??? Cancer Mother         breast   ??? Crohn's disease Neg Hx    ??? Ulcerative colitis Neg Hx    ???  Colorectal Cancer Neg Hx      Social history:   Social History     Socioeconomic History   ??? Marital status: Married     Spouse name: None   ??? Number of children: None   ??? Years of education: None   ??? Highest education level: None   Occupational History   ??? None   Tobacco Use   ??? Smoking status: Current Every Day Smoker     Packs/day: 0.25     Types: Cigarettes     Start date: 06/2009   ??? Smokeless tobacco: Never Used   Vaping Use   ??? Vaping Use: Never used   Substance and Sexual Activity   ??? 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 Drug use: No   ??? Sexual activity: None   Other Topics Concern   ??? None   Social History Narrative   ??? None     Social Determinants of Health     Financial Resource Strain: Not on file   Food Insecurity: Not on file   Transportation Needs: Not on file   Physical Activity: Not on file   Stress: Not on file   Social Connections: Not on file           Allergies:     Allergies   Allergen Reactions   ??? Triamterene Hives     Pt was given IV, and caused a breakout   ??? Ticarcillin-Clavulanate Rash     Timentin             Medications:     Current Outpatient Medications   Medication Sig Dispense Refill   ??? aspirin 325 MG tablet Take 325 mg by mouth daily.     ??? BACLOFEN ORAL Take 5 mg by mouth nightly.     ??? budesonide (ENTOCORT EC) 3 mg 24 hr capsule Take 3 capsules (9 mg total) by mouth every morning. 90 capsule 2   ??? fish oil-omega-3 fatty acids 300-1,000 mg capsule Take 2 g by mouth daily.     ??? gabapentin (NEURONTIN) 300 MG capsule Take 300 mg by mouth Three (3) times a day.     ??? HYDROcodone-acetaminophen (NORCO) 7.5-325 mg per tablet Take 3 tablets by mouth daily.      ??? lisinopril (PRINIVIL,ZESTRIL) 20 MG tablet Take 10 mg by mouth every other day. prn      ??? NEXIUM 40 mg capsule Take 40 mg by mouth every morning before breakfast.      ??? rosuvastatin (CRESTOR) 20 MG tablet Take 20 mg by mouth daily.     ??? tamsulosin (FLOMAX) 0.4 mg capsule        No current facility-administered medications for this visit.           Physical Exam:   BP 144/92  - Pulse 82  - Temp 36.4 ??C (97.6 ??F) (Temporal)  - Ht 175.3 cm (5' 9)  - Wt 88.1 kg (194 lb 3.4 oz)  - BMI 28.68 kg/m??     GEN: middle aged male in no apparent distress, appears comfortable on exam  NECK: Supple, no lymphadenopathy  LUNGS: CTAB, no wheezes, rales, or rhonchi  CV: S1/S2, RRR, no murmurs  ABD: Soft, mildly tender in RLQ on deep palpation only, no rebound/guarding, nondistended, normoactive bowel sounds, no appreciable organomegaly  Extremities: no cyanosis, clubbing or edema, normal gait  Psych: affect appropriate, A&O x3  SKIN:  no visible lesions on face, neck, arms, abdomen          Labs, Data & Indices:     Lab Review:   Lab Results   Component Value Date    WBC 7.0 05/10/2020    WBC 6.4 08/29/2014    RBC 4.29 (L) 05/10/2020    RBC 4.25 (L) 08/29/2014    HGB 14.0 05/10/2020    HGB 14.2 08/29/2014     Lab Results   Component Value Date    AST 15 05/10/2020    AST 18 (L) 08/29/2014    ALT 13 05/10/2020    ALT 27 08/29/2014    BUN 14 03/13/2020    BUN 19 10/04/2013    Creatinine Whole Blood, POC 1.1 02/24/2020    Creatinine 0.86 03/13/2020    CO2 26.6 03/13/2020    CO2 25 10/04/2013    Albumin 4.0 03/13/2020    Albumin 4.3 08/29/2014    Calcium 10.1 03/13/2020    Calcium 9.8 10/04/2013     Lab Results   Component Value Date    TSH 1.52 10/04/2013      ............................................................................................................................................  ORDERS THIS VISIT:       Diagnosis ICD-10-CM Associated Orders   1. Crohn's disease of small intestine without complication (CMS-HCC) K50.00             Assessment & Recommendations:   Disease state:  Clinical remission on immune modulator     Raymond Wright is a 63 y.o. male with a hx of ileal Crohn's disease dating back to ~2011.  He was previously on humira but stopped due to worsening joint pains and (+)ANA. He was on MTX from ~2015 - 2017, but he self discontinued this medication since he was feeling well.  He developed inflammatory ileal Crohn's flare in fall 2019.  We restarted Entocort + MTX but he had little improvement.  Hence, we changed to Stelara in Feb 2020 but he had no improvement after more than 1 year on Stelara with colonoscopy showing mild ileal disease.  He had a notable improvement with Budesonide so we started [facility] for maintenance therapy in Sept 2021. He has been on Entyvio for ~10 weeks with some improvement (less abd pain and less diarrhea) but mild residual symptoms. We will continue Entyvio for now and re-assess symptoms in ~3 months. He is also reporting leg heaviness and we will check b12 and Vitamin D levels.     PLAN:  1.  Continue Entyvio every 8 weeks.    2.  We will check B12 and Vitamin D levels with your next labs  3.  Follow up in March or April as convenient.   4.  I briefly discussed smoking cessation with him.  He is contemplative - feels he can do this on his own.     IBD health maintenance:  Influenza vaccine: 03/12/18  Pneumonia vaccine:  Prevnar 08/10/15, Pneumovax ~2012, Pneumovax 03/12/18  COVID19 Vaccine:  2 shots spring 2021  Hepatitis B: sAg neg 03/12/18  TB testing: Quantiferon neg 03/12/18  Chickenpox/Shingles history:  Had chickenpox, needs Shingrix  Bone denistometry:   Derm appointment:  Last colonoscopy:  2019  --------------------------------------------  I personally spent 33 minutes face-to-face and non-face-to-face in the care of this patient, which includes all pre, intra, and post visit time on the date of service.  In particular, we discussed various medical therapy options as discussed above.     Author: [name] 06/12/2020 3:26 PM    Sheketa Ende  Raphael Gibney, MD  Clinical Assistant Professor of Medicine  Division of Gastroenterology & Hepatology  [place] [facility]  ============================================

## 2020-06-12 ENCOUNTER — Ambulatory Visit: Admit: 2020-06-12 | Discharge: 2020-06-13 | Payer: MEDICARE

## 2020-06-12 DIAGNOSIS — K5000 Crohn's disease of small intestine without complications: Principal | ICD-10-CM

## 2020-06-12 DIAGNOSIS — Z6828 Body mass index (BMI) 28.0-28.9, adult: Principal | ICD-10-CM

## 2020-06-12 NOTE — Unmapped (Addendum)
Raymond Wright it was a pleasure seeing you today.  Here is a summary/wrap up from today's visit:     1.  Continue Entyvio every 8 weeks.    2.  We will check B12 and Vitamin D levels with your next labs  3.  Follow up in March or April as convenient.   4.  Great work on smoking cessation as we discussed.   5.  If any trouble or symptoms, do not hesitate to call.     [name], MD  Assistant Professor of Medicine  Division of Gastroenterology & Hepatology  [place] [facility]            EXPECTATIONS FOR PATIENT FOLLOW UP AND COMMUNICATION:  -- Follow up Appointments:  Crohn's disease and Ulcerative colitis are serious chronic inflammatory diseases which require close monitoring.  I typically expect to see patients for follow up visits at least every 6 months (or more often if you are having a flare, starting new therapy, etc).  In select cases, patients who are only on aminosalicylates / mesalamine, may be seen once per year for follow ups.      -- Communication:  if you have any questions or concerns, you can communicate with [place] via phone (contact information below) or via myChart.  Note that phone messages are given higher priority and triaged first.  We typically respond to myChart messages within 3 business days (occasionally longer during holidays or vacation times).  For urgent issues, please contact us via phone.      IBD NURSE COORDINATOR CONTACT ??? Neta Mends, RN BSN     Phone: [phone] (direct line)      Fax: [phone]  * For urgent medical concerns after hours or on weekends and holidays, call [phone] and ask to speak to the GI Fellow on call.    * If you have a GI medical question or GI symptoms and would like to speak to your provider's healthcare team, please contact Neta Mends, RN (contact information above) OR you can send the GI healthcare team a message through MyChart at [email or web address]    APPOINTMENT SCHEDULING FOR GI CLINIC AND GI PROCEDURES:  RADIOLOGY - to schedule imaging ordered, please call [phone] opt 1   GI MEDICINE CLINIC  [phone] option 1   GI PROCEDURES         [phone] option 2   *To schedule, reschedule, or cancel your GI appointment, please call [phone]. If you are unable to come to an appointment, please notify us as soon as possible, preferably 24 hours in advance. Doing so may allow other patients with urgent needs to be scheduled in a cancelled appointment slot.     TEST RESULTS   If you have a MyChart account, your new results and a provider message will be sent to you through your MyChart account at [email or web address]. For results that require follow-up, a member of your healthcare team will also contact you directly.    PRESCRIPTION REFILL REQUESTS  To request prescription refills, please contact your pharmacy or send your healthcare team a message through your MyChart account at [email or web address]  RECORD REQUESTS  For questions related to medical records, please call Medical Records Release of Information at [phone]  FINANCIAL COUNSELOR   For billing and other financial questions/needs ??? please contact Mee Hives at [phone]. If you need to leave a message, please be sure to leave your full name, date of birth or MR#, best call back #  and reason for call.    For educational material and resources:  [email or web address]  [place] COVID19 Vaccine Information: [email or web address]  ================================================================

## 2020-07-02 DIAGNOSIS — K50914 Crohn's disease, unspecified, with abscess: Principal | ICD-10-CM

## 2020-07-05 ENCOUNTER — Ambulatory Visit: Admit: 2020-07-05 | Discharge: 2020-07-06 | Payer: MEDICARE

## 2020-07-05 DIAGNOSIS — K50914 Crohn's disease, unspecified, with abscess: Principal | ICD-10-CM

## 2020-07-05 LAB — CBC W/ AUTO DIFF
BASOPHILS ABSOLUTE COUNT: 0.1 10*9/L (ref 0.0–0.1)
BASOPHILS RELATIVE PERCENT: 1.4 %
EOSINOPHILS ABSOLUTE COUNT: 1.3 10*9/L — ABNORMAL HIGH (ref 0.0–0.7)
EOSINOPHILS RELATIVE PERCENT: 16.2 %
HEMATOCRIT: 42 % (ref 38.0–50.0)
HEMOGLOBIN: 14.5 g/dL (ref 13.5–17.5)
LYMPHOCYTES ABSOLUTE COUNT: 1.7 10*9/L (ref 0.7–4.0)
LYMPHOCYTES RELATIVE PERCENT: 20.8 %
MEAN CORPUSCULAR HEMOGLOBIN CONC: 34.5 g/dL (ref 30.0–36.0)
MEAN CORPUSCULAR HEMOGLOBIN: 33 pg (ref 26.0–34.0)
MEAN CORPUSCULAR VOLUME: 95.6 fL — ABNORMAL HIGH (ref 81.0–95.0)
MEAN PLATELET VOLUME: 7.4 fL (ref 7.0–10.0)
MONOCYTES ABSOLUTE COUNT: 0.6 10*9/L (ref 0.1–1.0)
MONOCYTES RELATIVE PERCENT: 6.8 %
NEUTROPHILS ABSOLUTE COUNT: 4.5 10*9/L (ref 1.7–7.7)
NEUTROPHILS RELATIVE PERCENT: 54.8 %
PLATELET COUNT: 254 10*9/L (ref 150–450)
RED BLOOD CELL COUNT: 4.39 10*12/L (ref 4.32–5.72)
RED CELL DISTRIBUTION WIDTH: 13 % (ref 12.0–15.0)
WBC ADJUSTED: 8.2 10*9/L (ref 3.5–10.5)

## 2020-07-05 LAB — C-REACTIVE PROTEIN: C-REACTIVE PROTEIN: <4 mg/L (ref <=10.0)

## 2020-07-05 LAB — ALT: ALT (SGPT): 18 U/L (ref 10–49)

## 2020-07-05 LAB — AST: AST (SGOT): 21 U/L (ref <=34)

## 2020-07-05 LAB — VITAMIN B12: VITAMIN B-12: 409 pg/mL (ref 211–911)

## 2020-07-05 MED ADMIN — vedolizumab (ENTYVIO) 300 mg in sodium chloride (NS) 0.9 % 250 mL IVPB: 300 mg | INTRAVENOUS | @ 16:00:00 | Stop: 2020-07-05

## 2020-07-05 NOTE — Unmapped (Signed)
Pt presents for Entyvio infusion.   ??Pt denies any recent infections or changes to home meds, VSS.??24G??IV placed??in LAC,, labs drawn.  ????Pt aware of potential reaction/side effects, call bell within reach.  ??  @ 1034 am  ??Entyvio 300??mg started  @ 1110 am ??Infusion complete. Pt tolerated without complication  , VSS. ??IV flushed per policy and d/c'd, gauze and coban applied.   ??Pt left clinic in no acute distress.

## 2020-07-06 LAB — VITAMIN D 25 HYDROXY: VITAMIN D, TOTAL (25OH): 26.5 ng/mL (ref 20.0–80.0)

## 2020-08-06 NOTE — Unmapped (Addendum)
Called patient today as he states that he is the sickest he has ever been.   He has been having worsening diarrhea over the past month. He has diarrhea  6-12x/day. Entyvio last given on 07/05/20 (4th dose) and has been off budesonide since mid December.   Dr Raphael Gibney has a cancellation on his schedule tomorrow at 11:00, offered appointment and he agreed.  He also had a billing questions. Encouraged him to speak to the financial counselors tomorrow at [employer]

## 2020-08-07 ENCOUNTER — Ambulatory Visit: Admit: 2020-08-07 | Discharge: 2020-08-08 | Payer: MEDICARE

## 2020-08-07 DIAGNOSIS — K50914 Crohn's disease, unspecified, with abscess: Principal | ICD-10-CM

## 2020-08-07 DIAGNOSIS — K5000 Crohn's disease of small intestine without complications: Principal | ICD-10-CM

## 2020-08-07 DIAGNOSIS — R197 Diarrhea, unspecified: Principal | ICD-10-CM

## 2020-08-07 NOTE — Unmapped (Signed)
Raymond Wright it was a pleasure seeing you today.  Here is a summary/wrap up from today's visit:     1.  We will change to Remicade instead of Entyvio.   2.  We will also start Methotrexate - 15mg /week (6 tablets per week).   3.  Also use folic aicd 1mg  daily  4.  Go ahead and try Budesonide 3 tablets daily.  If that does not help your symptoms, we can change to prednisone.   5. I would suggest getting the Shingles vaccine (Shingrix).  If any trouble or symptoms, do not hesitate to call. Follow up 3-4 months.     [name], MD  Assistant Professor of Medicine  Division of Gastroenterology & Hepatology  [place] [facility]            EXPECTATIONS FOR PATIENT FOLLOW UP AND COMMUNICATION:  -- Follow up Appointments:  Crohn's disease and Ulcerative colitis are serious chronic inflammatory diseases which require close monitoring.  I typically expect to see patients for follow up visits at least every 6 months (or more often if you are having a flare, starting new therapy, etc).  In select cases, patients who are only on aminosalicylates / mesalamine, may be seen once per year for follow ups.      -- Communication:  if you have any questions or concerns, you can communicate with [place] via phone (contact information below) or via myChart.  Note that phone messages are given higher priority and triaged first.  We typically respond to myChart messages within 3 business days (occasionally longer during holidays or vacation times).  For urgent issues, please contact us via phone.      IBD NURSE COORDINATOR CONTACT ??? Neta Mends, RN BSN     Phone: [phone] (direct line)      Fax: [phone]  * For urgent medical concerns after hours or on weekends and holidays, call [phone] and ask to speak to the GI Fellow on call.    * If you have a GI medical question or GI symptoms and would like to speak to your provider's healthcare team, please contact Neta Mends, RN (contact information above) OR you can send the GI healthcare team a message through MyChart at [email or web address]    APPOINTMENT SCHEDULING FOR GI CLINIC AND GI PROCEDURES:  RADIOLOGY - to schedule imaging ordered, please call [phone] opt 1   GI MEDICINE CLINIC  [phone] option 1   GI PROCEDURES         [phone] option 2   *To schedule, reschedule, or cancel your GI appointment, please call [phone]. If you are unable to come to an appointment, please notify us as soon as possible, preferably 24 hours in advance. Doing so may allow other patients with urgent needs to be scheduled in a cancelled appointment slot.     TEST RESULTS   If you have a MyChart account, your new results and a provider message will be sent to you through your MyChart account at [email or web address]. For results that require follow-up, a member of your healthcare team will also contact you directly.    PRESCRIPTION REFILL REQUESTS  To request prescription refills, please contact your pharmacy or send your healthcare team a message through your MyChart account at [email or web address]  RECORD REQUESTS  For questions related to medical records, please call Medical Records Release of Information at [phone]  FINANCIAL COUNSELOR   For billing and other financial questions/needs ??? please contact Mee Hives at [phone].  If you need to leave a message, please be sure to leave your full name, date of birth or MR#, best call back # and reason for call.    For educational material and resources:  [email or web address]  [place] COVID19 Vaccine Information: [email or web address]  ================================================================

## 2020-08-07 NOTE — Unmapped (Signed)
Per Dr Raphael Gibney, pt stopping Entyvio and to start Remicade. TP initiated

## 2020-08-07 NOTE — Unmapped (Signed)
FOLLOW UP CONSULT NOTE - INFLAMMATORY BOWEL DISEASE  08/07/2020    Demographics:  Raymond Wright is a 65 y.o. year old male    Diagnosis:  Crohn's Disease  Disease onset (yr):  2011  Age at onset:  > 15yr old (A3)  Location:  Ileal (L1)  Behavior:  Nonstricturing,nonpenetrating (B1)  Current Tight Control Scenario:   Workup    Referring physician:   Referred Self  No address on file          HPI / NOTE :     Interval Events:   1.  Last visit with me Nov 2021.  At that time we decided to continue vedolizumab for his Crohn's as he was having some improvement with it.   2.  Reviewed recent labs from Jan 2022, CBC, AST, ALT, CRP all unremarkable.    HPI:   Feeling worse since christmas time. BMs are 6+ times per day.  Mucus in the stools. Lot of urgency. Has to stop what he is doing and rush to the bathroom. Very poor quality of life - having to interrupt activities to go to the bathroom.     Lot of joint pain - fingers, knees.     Still smoking cigarettes ~1/2ppd.     Abdominal pain (0-10):  intermittent lower abdominal pain.   BM a day: 6+ per day  Consistency:  Loose, foul smelling  % of stools have blood: 0%  Nocturnal BM: occ  Urgency:  yes  Nausea/vomiting: some nausea, less  Smoking: yes, 1/2ppd  NSAIDS: avoids    Review of Systems:   Review of systems positive for: negative except as above.   Otherwise, the balance of 10 systems is negative.          IBD HISTORY:     Year of disease onset:  2011    Brief IBD Disease Course:    - 2011 - onset of abdominal pain and diarrhea.  Had MRI locally - told thickening of the bowel and then had a colonoscopy and given dx of ileal Crohn's disease.  Treated with Entocort.   - 2014 - September, hospitalized with severe diarrhea, was treated with Prednisone and then Humira.  Humira stopped in 2015 due to joint symptoms and (+)ANA. Then treated with Entocort + MTX and doing well since then.   - 2016 - continued on MTX 25mg  / week  - 2017 - self-discontinued MTX, no GI follow up  - 2019 - re-established GI care with [place] at [facility].  Restarted methotrexate.    - 2020 - February - started Stelara.  Primary non-response  - 2021 - EGD / Colonoscopy 01/2020 - mild ileal disease.  Good response to Entocort, started Vedolizumab 02/2020.    - 2022 - disease flare on Entyvio, 6+ BM/day, changing to Infliximab.     Endoscopy:      - Colonoscopy 10/14/13 - mild ileal Crohn's disease with aphthae, Two 3-36mm polyps in rectosigmoid colon (Path = tubular adenoma), otherwise normal colon.   - Colonoscopy 04/12/18 - poor prep - moderate ileitis for at least 10cm. Otherwise normal colon.    - EGD 02/13/2020 - enlarged gastric folds (bx - benign), otherwise normal esophagus, stomach, duodenum.   - Colonoscopy 02/13/2020 - 4mm descending colon polyp.  10mm ascending colon.  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  Crohn's disease with moderate ileitis for at least 10cm - similar to Jan 2020 colonoscopy. SES-CD = 4.   PATH = ileum - mildly active chronic  enteritis.  No granulomas.  Polyps - sessile serrated (ascending), adenoma (descending).     Imaging:    - CT A/P 10/22/13 - normal bowel with no signs of active inflammatino.   - CT-E 03/24/18 - inflammation and thickening of terminal ileum consistent with Crohn's flare. Bilateral renal stones.     Prior IBD medications (type, dose, duration, response):  ? 5-ASAs  x Oral corticosteroids - Prednisone, Entocort  ? Intravenous corticosteroids  ? Antibiotics  ? Thiopurines  x Methotrexate - started April 2015.  Re-challenge 2019 - no improvement  x Anti-TNF therapies - Humira 2014 - then developed joint pains and (+)ANA so it was stopped  x Anti-Interleukin therapies - Stelara 07/2018 - 01/2020 - primary non-response.   x Anti-Integrin therapies - Vedolizumab 02/2020  ? Cyclosporine  ? Clinical trial medication  ? Other (Please specify):    Extraintestinal manifestations:   -joint pains affecting: n  -eye: n  -skin: n  -oral ulcers :  n  -blood clots: n  -PSC: n  -other: n Past Medical History:   Past medical history:   Past Medical History:   Diagnosis Date   ??? AC (acromioclavicular) joint bone spurs     in back   ??? Bulging discs    ??? Crohn's disease (CMS-HCC)    ??? Degenerative disc disease    ??? GERD (gastroesophageal reflux disease)    ??? Hypertension    ??? Stroke (CMS-HCC)      Past surgical history:   Past Surgical History:   Procedure Laterality Date   ??? CERVICAL FUSION  2016   ??? KNEE ARTHROSCOPY Right    ??? PR COLONOSCOPY W/BIOPSY SINGLE/MULTIPLE N/A 04/12/2018    Procedure: COLONOSCOPY, FLEXIBLE, PROXIMAL TO SPLENIC FLEXURE; WITH BIOPSY, SINGLE OR MULTIPLE;  Surgeon: [name], MD;  Location: [facility];  Service: Gastroenterology   ??? PR COLONOSCOPY W/BIOPSY SINGLE/MULTIPLE Left 02/13/2020    Procedure: COLONOSCOPY, FLEXIBLE, PROXIMAL TO SPLENIC FLEXURE; WITH BIOPSY, SINGLE OR MULTIPLE;  Surgeon: [name], MD;  Location: [facility];  Service: Gastroenterology   ??? PR COLSC FLX W/RMVL OF TUMOR POLYP LESION SNARE TQ N/A 10/14/2013    Procedure: COLONOSCOPY FLEX; W/REMOV TUMOR/LES BY SNARE;  Surgeon: Theadore Nan, MD;  Location: [facility];  Service: Gastroenterology   ??? PR COLSC FLX W/RMVL OF TUMOR POLYP LESION SNARE TQ Left 02/13/2020    Procedure: COLONOSCOPY FLEX; W/REMOV TUMOR/LES BY SNARE;  Surgeon: [name], MD;  Location: [facility];  Service: Gastroenterology   ??? PR UPPER GI ENDOSCOPY,BIOPSY Left 02/13/2020    Procedure: UGI ENDOSCOPY; WITH BIOPSY, SINGLE OR MULTIPLE;  Surgeon: [name], MD;  Location: [facility];  Service: Gastroenterology     Family history:   Family History   Problem Relation Age of Onset   ??? Cancer Mother         breast   ??? Crohn's disease Neg Hx    ??? Ulcerative colitis Neg Hx    ??? Colorectal Cancer Neg Hx      Social history:   Social History     Socioeconomic History   ??? Marital status: Married     Spouse name: None   ??? Number of children: None   ??? Years of education: None   ??? Highest education level: None   Occupational History   ??? None   Tobacco Use   ??? Smoking status: Current Every Day Smoker  Packs/day: 0.25     Types: Cigarettes     Start date: 06/2009   ??? Smokeless tobacco: Never Used   Vaping Use   ??? Vaping Use: Never used   Substance and Sexual Activity   ??? 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 Drug use: No   ??? Sexual activity: None   Other Topics Concern   ??? None   Social History Narrative   ??? None     Social Determinants of Health     Financial Resource Strain: Not on file   Food Insecurity: Not on file   Transportation Needs: Not on file   Physical Activity: Not on file   Stress: Not on file   Social Connections: Not on file           Allergies:     Allergies   Allergen Reactions   ??? Triamterene Hives     Pt was given IV, and caused a breakout   ??? Ticarcillin-Clavulanate Rash     Timentin             Medications:     Current Outpatient Medications   Medication Sig Dispense Refill   ??? aspirin 325 MG tablet Take 325 mg by mouth daily.     ??? BACLOFEN ORAL Take 5 mg by mouth nightly.     ??? fish oil-omega-3 fatty acids 300-1,000 mg capsule Take 2 g by mouth daily.     ??? gabapentin (NEURONTIN) 300 MG capsule Take 300 mg by mouth Three (3) times a day.     ??? HYDROcodone-acetaminophen (NORCO) 7.5-325 mg per tablet Take 3 tablets by mouth daily.      ??? lisinopril (PRINIVIL,ZESTRIL) 20 MG tablet Take 10 mg by mouth every other day. prn      ??? NEXIUM 40 mg capsule Take 40 mg by mouth every morning before breakfast.      ??? rosuvastatin (CRESTOR) 20 MG tablet Take 20 mg by mouth daily.     ??? tamsulosin (FLOMAX) 0.4 mg capsule        No current facility-administered medications for this visit.           Physical Exam:   BP 141/88  - Pulse 76  - Temp 36.3 ??C (97.4 ??F) (Temporal)  - Ht 175.3 cm (5' 9)  - Wt 90.5 kg (199 lb 9.6 oz)  - BMI 29.48 kg/m??     GEN: middle aged male in no apparent distress, appears comfortable on exam  NECK: Supple, no lymphadenopathy  LUNGS: CTAB, no wheezes, rales, or rhonchi  CV: S1/S2, RRR, no murmurs  ABD: Soft, minimally tender to deep palpation only, no rebound/guarding, nondistended, normoactive bowel sounds, no appreciable organomegaly  Extremities: no cyanosis, clubbing or edema, normal gait  Psych: affect appropriate, A&O x3  SKIN: no visible lesions on face, neck, arms, abdomen          Labs, Data & Indices:     Lab Review:   Lab Results   Component Value Date    WBC 8.2 07/05/2020    WBC 6.4 08/29/2014    RBC 4.39 07/05/2020    RBC 4.25 (L) 08/29/2014    HGB 14.5 07/05/2020    HGB 14.2 08/29/2014     Lab Results   Component Value Date    AST 21 07/05/2020    AST 18 (L) 08/29/2014    ALT 18 07/05/2020  ALT 27 08/29/2014    BUN 14 03/13/2020    BUN 19 10/04/2013    Creatinine Whole Blood, POC 1.1 02/24/2020    Creatinine 0.86 03/13/2020    CO2 26.6 03/13/2020    CO2 25 10/04/2013    Albumin 4.0 03/13/2020    Albumin 4.3 08/29/2014    Calcium 10.1 03/13/2020    Calcium 9.8 10/04/2013     Lab Results   Component Value Date    TSH 1.52 10/04/2013       Anda Kraft Index for Crohn's Disease  General Well Being: 1 = Slightly below par  Abdominal Pain:  1 = mild  Number of Liquid Stools Per day: 5  Abdominal Mass:  0 = None  Number of Extraintestinal Manifestations:  1   1 point each for: 1. Arthritis/arthralgias, 2. Iritis/uveitis, 3. Active perianal dz, 4. Other active fistula, 5. Erythema nodosum or pyoderma, 6. Other    Total HBI Score (0-25):  8     Score:  Remission < 5;  Mild dz 5-7;  Moderate dz 8-16;  Severe > 16  ............................................................................................................................................  ORDERS THIS VISIT:       Diagnosis ICD-10-CM Associated Orders   1. Crohn's disease of small intestine without complication (CMS-HCC)  K50.00 GI Pathogen Panel     C. Difficile Assay   2. Diarrhea, unspecified type  R19.7 GI Pathogen Panel     C. Difficile Assay            Assessment & Recommendations:   Disease state:  Active Crohn's flare    Arlon Bleier is a 65 y.o. male with a hx of ileal Crohn's disease dating back to ~2011.  He was previously on humira but stopped due to worsening joint pains and (+)ANA. He was on MTX from ~2015 - 2017, but he self discontinued this medication since he was feeling well.  He developed inflammatory ileal Crohn's flare in fall 2019. Since then, he has had a poor response to Entocort + methotrexate, Stelara 2020-2021 and is now having worsening Crohn's symptoms despite being on Vedolizumab for 5 months.  I think this is a primary non-response to Vedolizumab. I discussed potentially trying q4 week Entyvio with a prednisone taper but he feels that Entyvio isn't really helping his Crohn's at all.      I discussed various options with him and we decided to change to Remicade + Methotrexate combination therapy at this time (we will use Methotrexate since he took this in the past tolerated it well).  Idiscussed the expected benefits and risks of Infliximab + immune modulator combination therapy, including risks of immune suppression, lymphoma, skin cancer, hepatitis, pancreatitis and cytopenias. He is in agreement with the plan.     PLAN:  1.  We will change to Remicade instead of Entyvio.   2.  We will also start Methotrexate - 15mg /week (6 tablets per week).   3.  Also use folic aicd 1mg  daily  4.  Go ahead and try Budesonide 3 tablets daily.  If that does not help your symptoms, we can change to prednisone.   5. I would suggest getting the Shingles vaccine (Shingrix).      IBD health maintenance:  Influenza vaccine: 03/12/18  Pneumonia vaccine:  Prevnar 08/10/15, Pneumovax ~2012, Pneumovax 03/12/18  COVID19 Vaccine:  2 shots spring 2021  Hepatitis B: sAg neg 03/12/18  TB testing: Quantiferon neg 03/12/18  Chickenpox/Shingles history:  Had chickenpox, needs Shingrix  Bone denistometry:   Derm appointment:  Last  colonoscopy:  2019  --------------------------------------------  Author: [name] 08/07/2020 12:25 PM    [name], MD  Clinical Assistant Professor of Medicine  Division of Gastroenterology & Hepatology  [facility]  ============================================

## 2020-08-08 DIAGNOSIS — K50914 Crohn's disease, unspecified, with abscess: Principal | ICD-10-CM

## 2020-08-14 DIAGNOSIS — K50914 Crohn's disease, unspecified, with abscess: Principal | ICD-10-CM

## 2020-08-17 ENCOUNTER — Ambulatory Visit: Admit: 2020-08-17 | Discharge: 2020-08-18 | Payer: MEDICARE

## 2020-08-17 LAB — CBC W/ AUTO DIFF
BASOPHILS ABSOLUTE COUNT: 0.1 10*9/L (ref 0.0–0.1)
BASOPHILS RELATIVE PERCENT: 1.5 %
EOSINOPHILS ABSOLUTE COUNT: 1 10*9/L — ABNORMAL HIGH (ref 0.0–0.7)
EOSINOPHILS RELATIVE PERCENT: 12.7 %
HEMATOCRIT: 38.9 % (ref 38.0–50.0)
HEMOGLOBIN: 13.9 g/dL (ref 13.5–17.5)
LYMPHOCYTES ABSOLUTE COUNT: 1.8 10*9/L (ref 0.7–4.0)
LYMPHOCYTES RELATIVE PERCENT: 24.3 %
MEAN CORPUSCULAR HEMOGLOBIN CONC: 35.7 g/dL (ref 30.0–36.0)
MEAN CORPUSCULAR HEMOGLOBIN: 33.2 pg (ref 26.0–34.0)
MEAN CORPUSCULAR VOLUME: 92.9 fL (ref 81.0–95.0)
MEAN PLATELET VOLUME: 7.2 fL (ref 7.0–10.0)
MONOCYTES ABSOLUTE COUNT: 0.6 10*9/L (ref 0.1–1.0)
MONOCYTES RELATIVE PERCENT: 8 %
NEUTROPHILS ABSOLUTE COUNT: 4.1 10*9/L (ref 1.7–7.7)
NEUTROPHILS RELATIVE PERCENT: 53.5 %
NUCLEATED RED BLOOD CELLS: 0 /100{WBCs} (ref <=4)
PLATELET COUNT: 245 10*9/L (ref 150–450)
RED BLOOD CELL COUNT: 4.19 10*12/L — ABNORMAL LOW (ref 4.32–5.72)
RED CELL DISTRIBUTION WIDTH: 13.4 % (ref 12.0–15.0)
WBC ADJUSTED: 7.6 10*9/L (ref 3.5–10.5)

## 2020-08-17 LAB — C-REACTIVE PROTEIN: C-REACTIVE PROTEIN: <4 mg/L (ref <=10.0)

## 2020-08-17 LAB — ALT: ALT (SGPT): 13 U/L (ref 10–49)

## 2020-08-17 MED ADMIN — methylPREDNISolone sodium succinate (PF) (Solu-MEDROL) injection 20 mg: 20 mg | INTRAVENOUS | @ 16:00:00 | Stop: 2020-08-17

## 2020-08-17 MED ADMIN — cetirizine (ZyrTEC) tablet 10 mg: 10 mg | ORAL | @ 16:00:00 | Stop: 2020-08-17

## 2020-08-17 MED ADMIN — diphenhydrAMINE (BENADRYL) capsule/tablet 25 mg: 25 mg | ORAL | @ 16:00:00 | Stop: 2020-08-17

## 2020-08-17 MED ADMIN — infliximab-axxq (AVSOLA) 5 mg/kg = 400 mg in sodium chloride (NS) 250 mL IVPB: 5 mg/kg | INTRAVENOUS | @ 16:00:00 | Stop: 2020-08-17

## 2020-08-17 NOTE — Unmapped (Signed)
Remicade Infusion Discharge Instructions    You may return to your normal diet, medications, and activities.     Most patients state that they begin to notice symptom relief after the 3 loading doses of Remicade are complete.     Some patients experience side effects during or within 1 -2 hours after the infusion.  These may include:  ??? Flu-like symptoms  ??? Headache  ??? Chills  ??? Nausea  ??? Cramping and/or diarrhea  ??? Cough  ??? Fever  ??? Rash (with or without itching)  ??? Fatigue    If these symptoms persist for greater than 3 days, please contact your provider.    If you develop serious symptoms such as chest pain, shortness of breath, or difficulty breathing, please call 911 or go to the nearest Emergency Department.    Serious infections have been reported in patients receiving Remicade and other TNF-Blocking agents, especially within the first 14 days of infusion as your immune system is decreased and can't fight infection as well. Please notify your doctor if you develop symptoms of an infection (fever, green or yellow tinged drainage, cough, burning, or frequency with urination).    If you are scheduled for an infusion and you either become sick or are receiving an antibiotic for the illness/infection, please call us at the number below before arriving to your appointment. We will contact your provider to request whether you should receive the infusion at that time.     If the IV site is painful, place a cold compress on the site for 10-15 minutes 4 times a day until the soreness is gone. Notify your doctor if the site becomes red and hot to the touch.    If you should have any questions for the Infusion staff, please contact us at:   [phone] [place] or [phone] Nurses??? Station

## 2020-08-17 NOTE — Unmapped (Signed)
1015 Patient presents for standard Avsola  infusion.  In no acute distress. Vitals stable.  Reports no new medical issues or S/S of infection.  IV started.  See MAR for premeds.    1101 Avsola 400 mg inm 250 ml started to infuse as follows:     29ml/hr x 15 min  15ml/hr x 15 min  1ml/hr x 15 min  31ml/hr x 15 min  158ml/hr x 30 min  267ml/hr for the remainder of the infusion.    1301 Avsola infusion complete.  PIV flushed with NS. Vitals stable. Observation time started.  Pt agreed to stay until 1325.  States he has to pick up his grandchildren from school.      1325 Patient without any s/s of adverse reaction.  V d/c'd.  Patient discharged from Infusion Center.

## 2020-08-28 MED ORDER — BUDESONIDE DR - ER 3 MG CAPSULE,DELAYED,EXTENDED RELEASE
ORAL_CAPSULE | Freq: Every morning | ORAL | 1 refills | 30 days | Status: CP
Start: 2020-08-28 — End: ?

## 2020-08-30 ENCOUNTER — Ambulatory Visit: Admit: 2020-08-30 | Discharge: 2020-08-31 | Payer: MEDICARE

## 2020-08-30 LAB — CBC W/ AUTO DIFF
BASOPHILS ABSOLUTE COUNT: 0.1 10*9/L (ref 0.0–0.1)
BASOPHILS RELATIVE PERCENT: 2 %
EOSINOPHILS ABSOLUTE COUNT: 0.7 10*9/L — ABNORMAL HIGH (ref 0.0–0.5)
EOSINOPHILS RELATIVE PERCENT: 10.5 %
HEMATOCRIT: 40.7 % (ref 39.0–48.0)
HEMOGLOBIN: 14 g/dL (ref 12.9–16.5)
LYMPHOCYTES ABSOLUTE COUNT: 2.2 10*9/L (ref 1.1–3.6)
LYMPHOCYTES RELATIVE PERCENT: 34.2 %
MEAN CORPUSCULAR HEMOGLOBIN CONC: 34.5 g/dL (ref 32.0–36.0)
MEAN CORPUSCULAR HEMOGLOBIN: 32.4 pg (ref 25.9–32.4)
MEAN CORPUSCULAR VOLUME: 93.8 fL (ref 77.6–95.7)
MEAN PLATELET VOLUME: 7.4 fL (ref 6.8–10.7)
MONOCYTES ABSOLUTE COUNT: 0.4 10*9/L (ref 0.3–0.8)
MONOCYTES RELATIVE PERCENT: 6.8 %
NEUTROPHILS ABSOLUTE COUNT: 3 10*9/L (ref 1.8–7.8)
NEUTROPHILS RELATIVE PERCENT: 46.5 %
NUCLEATED RED BLOOD CELLS: 0 /100{WBCs} (ref <=4)
PLATELET COUNT: 221 10*9/L (ref 150–450)
RED BLOOD CELL COUNT: 4.33 10*12/L (ref 4.26–5.60)
RED CELL DISTRIBUTION WIDTH: 13.5 % (ref 12.2–15.2)
WBC ADJUSTED: 6.5 10*9/L (ref 3.6–11.2)

## 2020-08-30 LAB — C-REACTIVE PROTEIN: C-REACTIVE PROTEIN: <4 mg/L (ref <=10.0)

## 2020-08-30 LAB — AST: AST (SGOT): 19 U/L (ref <=34)

## 2020-08-30 LAB — ALT: ALT (SGPT): 15 U/L (ref 10–49)

## 2020-08-30 MED ADMIN — infliximab-axxq (AVSOLA) 5 mg/kg = 400 mg in sodium chloride (NS) 250 mL IVPB: 5 mg/kg | INTRAVENOUS | @ 16:00:00 | Stop: 2020-08-30

## 2020-08-30 MED ADMIN — methylPREDNISolone sodium succinate (PF) (Solu-MEDROL) injection 20 mg: 20 mg | INTRAVENOUS | @ 16:00:00 | Stop: 2020-08-30

## 2020-08-30 MED ADMIN — cetirizine (ZyrTEC) tablet 10 mg: 10 mg | ORAL | @ 16:00:00 | Stop: 2020-08-30

## 2020-08-30 NOTE — Unmapped (Deleted)
A user error has taken place: {error:[id number]}.

## 2020-08-30 NOTE — Unmapped (Signed)
1023 Patient presents for 2nd loading standard Avsola (infliximab-axxq) infusion.  In no acute distress. Vitals stable.  Reports no new medical issues or S/S of infection.  IV started.  See MAR for premeds.    1046 Avsola (infliximab-axxq) 400 mg started to infuse as follows:     52ml/hr x 15 min  43ml/hr x 15 min  24ml/hr x 15 min  23ml/hr x 15 min  136ml/hr x 30 min  275ml/hr for the remainder of the infusion.    1300 Avsola (infliximab-axxq) infusion complete.  PIV flushed with NS.  Vitals stable.  Patient without any s/s of adverse reaction.    1305 IV d/c'd.  Patient discharged from Infusion Center.

## 2020-09-01 NOTE — Unmapped (Unsigned)
FOLLOW UP CONSULT NOTE - INFLAMMATORY BOWEL DISEASE  08/31/2020    Demographics:  Raymond Wright is a 65 y.o. year old male    Diagnosis:  Crohn's Disease  Disease onset (yr):  2011  Age at onset:  > 66yr old (A3)  Location:  Ileal (L1)  Behavior:  Nonstricturing,nonpenetrating (B1)  Current Tight Control Scenario:   Induction = Biologic          HPI / NOTE :     Interval Events:   1.  Since last visit, he started IFX + Methotrexate, got 2nd loading dose on 08/30/2020.   2.  I reviewed recent labs from 08/30/2020 notable for normal CBC, AST, ALT, CRP.     HPI:   ****    (joint pain, smoking, bowel symptoms)    Still smoking cigarettes ~1/2ppd.     Abdominal pain (0-10):  intermittent lower abdominal pain.   BM a day: 6+ per day  Consistency:  Loose, foul smelling  % of stools have blood: 0%  Nocturnal BM: occ  Urgency:  yes  Nausea/vomiting: some nausea, less  Smoking: yes, 1/2ppd  NSAIDS: avoids    Review of Systems:   Review of systems positive for: negative except as above.   Otherwise, the balance of 10 systems is negative.          IBD HISTORY:     Year of disease onset:  2011    Brief IBD Disease Course:    - 2011 - onset of abdominal pain and diarrhea.  Had MRI locally - told thickening of the bowel and then had a colonoscopy and given dx of ileal Crohn's disease.  Treated with Entocort.   - 2014 - September, hospitalized with severe diarrhea, was treated with Prednisone and then Humira.  Humira stopped in 2015 due to joint symptoms and (+)ANA. Then treated with Entocort + MTX and doing well since then.   - 2016 - continued on MTX 25mg  / week  - 2017 - self-discontinued MTX, no GI follow up  - 2019 - re-established GI care with [place] at [facility].  Restarted methotrexate.    - 2020 - February - started Stelara.  Primary non-response  - 2021 - EGD / Colonoscopy 01/2020 - mild ileal disease.  Good response to Entocort, started Vedolizumab 02/2020.    - 2022 - disease flare on Entyvio, 6+ BM/day, changing to Infliximab + MTX. Endoscopy:      - Colonoscopy 10/14/13 - mild ileal Crohn's disease with aphthae, Two 3-27mm polyps in rectosigmoid colon (Path = tubular adenoma), otherwise normal colon.   - Colonoscopy 04/12/18 - poor prep - moderate ileitis for at least 10cm. Otherwise normal colon.    - EGD 02/13/2020 - enlarged gastric folds (bx - benign), otherwise normal esophagus, stomach, duodenum.   - Colonoscopy 02/13/2020 - 4mm descending colon polyp.  10mm ascending colon.  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  Crohn's disease with moderate ileitis for at least 10cm - similar to Jan 2020 colonoscopy. SES-CD = 4.   PATH = ileum - mildly active chronic enteritis.  No granulomas.  Polyps - sessile serrated (ascending), adenoma (descending).     Imaging:    - CT A/P 10/22/13 - normal bowel with no signs of active inflammatino.   - CT-E 03/24/18 - inflammation and thickening of terminal ileum consistent with Crohn's flare. Bilateral renal stones.     Prior IBD medications (type, dose, duration, response):  ? 5-ASAs  x Oral corticosteroids - Prednisone, Entocort  ?  Intravenous corticosteroids  ? Antibiotics  ? Thiopurines  x Methotrexate - started April 2015.  Re-challenge 2019 - no improvement  x Anti-TNF therapies - Humira 2014 - then developed joint pains and (+)ANA so it was stopped  x Anti-Interleukin therapies - Stelara 07/2018 - 01/2020 - primary non-response.   x Anti-Integrin therapies - Vedolizumab 02/2020  ? Cyclosporine  ? Clinical trial medication  ? Other (Please specify):    Extraintestinal manifestations:   -joint pains affecting: n  -eye: n  -skin: n  -oral ulcers :  n  -blood clots: n  -PSC: n  -other: n          Past Medical History:   Past medical history:   Past Medical History:   Diagnosis Date   ??? AC (acromioclavicular) joint bone spurs     in back   ??? Bulging discs    ??? Crohn's disease (CMS-HCC)    ??? Degenerative disc disease    ??? GERD (gastroesophageal reflux disease)    ??? Hypertension    ??? Stroke (CMS-HCC) Past surgical history:   Past Surgical History:   Procedure Laterality Date   ??? CERVICAL FUSION  2016   ??? KNEE ARTHROSCOPY Right    ??? PR COLONOSCOPY W/BIOPSY SINGLE/MULTIPLE N/A 04/12/2018    Procedure: COLONOSCOPY, FLEXIBLE, PROXIMAL TO SPLENIC FLEXURE; WITH BIOPSY, SINGLE OR MULTIPLE;  Surgeon: [name], MD;  Location: [facility];  Service: Gastroenterology   ??? PR COLONOSCOPY W/BIOPSY SINGLE/MULTIPLE Left 02/13/2020    Procedure: COLONOSCOPY, FLEXIBLE, PROXIMAL TO SPLENIC FLEXURE; WITH BIOPSY, SINGLE OR MULTIPLE;  Surgeon: [name], MD;  Location: [facility];  Service: Gastroenterology   ??? PR COLSC FLX W/RMVL OF TUMOR POLYP LESION SNARE TQ N/A 10/14/2013    Procedure: COLONOSCOPY FLEX; W/REMOV TUMOR/LES BY SNARE;  Surgeon: Theadore Nan, MD;  Location: [facility];  Service: Gastroenterology   ??? PR COLSC FLX W/RMVL OF TUMOR POLYP LESION SNARE TQ Left 02/13/2020    Procedure: COLONOSCOPY FLEX; W/REMOV TUMOR/LES BY SNARE;  Surgeon: [name], MD;  Location: [facility];  Service: Gastroenterology   ??? PR UPPER GI ENDOSCOPY,BIOPSY Left 02/13/2020    Procedure: UGI ENDOSCOPY; WITH BIOPSY, SINGLE OR MULTIPLE;  Surgeon: [name], MD;  Location: [facility];  Service: Gastroenterology     Family history:   Family History   Problem Relation Age of Onset   ??? Cancer Mother         breast   ??? Crohn's disease Neg Hx    ??? Ulcerative colitis Neg Hx    ??? Colorectal Cancer Neg Hx      Social history:   Social History     Socioeconomic History   ??? Marital status: Married     Spouse name: Not on file   ??? Number of children: Not on file   ??? Years of education: Not on file   ??? Highest education level: Not on file   Occupational History   ??? Not on file   Tobacco Use   ??? Smoking status: Current Every Day Smoker     Packs/day: 0.25     Types: Cigarettes     Start date: 06/2009   ??? Smokeless tobacco: Never Used   Vaping Use   ??? Vaping Use: Never used   Substance and Sexual Activity   ??? 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  Drug use: No   ??? Sexual activity: Not on file   Other Topics Concern   ??? Not on file   Social History Narrative   ??? Not on file     Social Determinants of Health     Financial Resource Strain: Not on file   Food Insecurity: Not on file   Transportation Needs: Not on file   Physical Activity: Not on file   Stress: Not on file   Social Connections: Not on file           Allergies:     Allergies   Allergen Reactions   ??? Triamterene Hives     Pt was given IV, and caused a breakout   ??? Ticarcillin-Clavulanate Rash     Timentin             Medications:     Current Outpatient Medications   Medication Sig Dispense Refill   ??? aspirin 325 MG tablet Take 325 mg by mouth daily.     ??? BACLOFEN ORAL Take 5 mg by mouth nightly.     ??? budesonide (ENTOCORT EC) 3 mg 24 hr capsule Take 3 capsules (9 mg total) by mouth every morning. 90 capsule 1   ??? fish oil-omega-3 fatty acids 300-1,000 mg capsule Take 2 g by mouth daily.     ??? gabapentin (NEURONTIN) 300 MG capsule Take 300 mg by mouth Three (3) times a day.     ??? HYDROcodone-acetaminophen (NORCO) 7.5-325 mg per tablet Take 3 tablets by mouth daily.      ??? lisinopril (PRINIVIL,ZESTRIL) 20 MG tablet Take 10 mg by mouth every other day. prn      ??? NEXIUM 40 mg capsule Take 40 mg by mouth every morning before breakfast.      ??? rosuvastatin (CRESTOR) 20 MG tablet Take 20 mg by mouth daily.     ??? tamsulosin (FLOMAX) 0.4 mg capsule        No current facility-administered medications for this visit.           Physical Exam:   There were no vitals taken for this visit.  ***  GEN: middle aged male in no apparent distress, appears comfortable on exam  NECK: Supple, no lymphadenopathy  LUNGS: CTAB, no wheezes, rales, or rhonchi  CV: S1/S2, RRR, no murmurs  ABD: Soft, minimally tender to deep palpation only, no rebound/guarding, nondistended, normoactive bowel sounds, no appreciable organomegaly  Extremities: no cyanosis, clubbing or edema, normal gait  Psych: affect appropriate, A&O x3  SKIN: no visible lesions on face, neck, arms, abdomen          Labs, Data & Indices:     Lab Review:   Lab Results   Component Value Date    WBC 6.5 08/30/2020    WBC 6.4 08/29/2014    RBC 4.33 08/30/2020    RBC 4.25 (L) 08/29/2014    HGB 14.0 08/30/2020    HGB 14.2 08/29/2014     Lab Results   Component Value Date    AST 19 08/30/2020    AST 18 (L) 08/29/2014    ALT 15 08/30/2020    ALT 27 08/29/2014    BUN 14 03/13/2020    BUN 19 10/04/2013    Creatinine Whole Blood, POC 1.1 02/24/2020    Creatinine 0.86 03/13/2020    CO2 26.6 03/13/2020    CO2 25 10/04/2013    Albumin 4.0 03/13/2020    Albumin 4.3 08/29/2014    Calcium 10.1 03/13/2020  Calcium 9.8 10/04/2013     Lab Results   Component Value Date    TSH 1.52 10/04/2013       Anda Kraft Index for Crohn's Disease  General Well Being: 1 = Slightly below par  Abdominal Pain:  1 = mild  Number of Liquid Stools Per day: 5  Abdominal Mass:  0 = None  Number of Extraintestinal Manifestations:  1   1 point each for: 1. Arthritis/arthralgias, 2. Iritis/uveitis, 3. Active perianal dz, 4. Other active fistula, 5. Erythema nodosum or pyoderma, 6. Other    Total HBI Score (0-25):  8     Score:  Remission < 5;  Mild dz 5-7;  Moderate dz 8-16;  Severe > 16  ............................................................................................................................................  ORDERS THIS VISIT:      {No diagnosis found. (Refresh or delete this SmartLink)}         Assessment & Recommendations:   Disease state:  Active Crohn's flare    Raymond Wright is a 65 y.o. male with a hx of ileal Crohn's disease dating back to ~2011.  He was previously on humira but stopped due to worsening joint pains and (+)ANA. He was on MTX from ~2015 - 2017, but he self discontinued this medication since he was feeling well.  He developed inflammatory ileal Crohn's flare in fall 2019. Since then, he has had a poor response to Entocort + methotrexate, Stelara 2020-2021 and is now having worsening Crohn's symptoms despite being on Vedolizumab for 5 months.  Hence we started Infliximab + Methotrexate in Feb 2021.   ***  PLAN:  1.  We will change to Remicade instead of Entyvio.   2.  We will also start Methotrexate - 15mg /week (6 tablets per week).   3.  Also use folic aicd 1mg  daily  4.  Go ahead and try Budesonide 3 tablets daily.  If that does not help your symptoms, we can change to prednisone.   5. I would suggest getting the Shingles vaccine (Shingrix).      IBD health maintenance:  Influenza vaccine: 03/12/18  Pneumonia vaccine:  Prevnar 08/10/15, Pneumovax ~2012, Pneumovax 03/12/18  COVID19 Vaccine:  2 shots spring 2021  Hepatitis B: sAg neg 03/12/18  TB testing: Quantiferon neg 03/12/18  Chickenpox/Shingles history:  Had chickenpox, needs Shingrix  Bone denistometry:   Derm appointment:  Last colonoscopy:  2019  --------------------------------------------  Author: [name] 08/31/2020 7:37 PM    [name], MD  Clinical Assistant Professor of Medicine  Division of Gastroenterology & Hepatology  [facility]  ============================================

## 2020-09-04 ENCOUNTER — Ambulatory Visit: Admit: 2020-09-04 | Payer: MEDICARE

## 2020-09-27 ENCOUNTER — Ambulatory Visit: Admit: 2020-09-27 | Discharge: 2020-09-28 | Payer: MEDICARE

## 2020-09-27 LAB — CBC W/ AUTO DIFF
BASOPHILS ABSOLUTE COUNT: 0.1 10*9/L (ref 0.0–0.1)
BASOPHILS RELATIVE PERCENT: 1.4 %
EOSINOPHILS ABSOLUTE COUNT: 0.6 10*9/L — ABNORMAL HIGH (ref 0.0–0.5)
EOSINOPHILS RELATIVE PERCENT: 7.2 %
HEMATOCRIT: 38.2 % — ABNORMAL LOW (ref 39.0–48.0)
HEMOGLOBIN: 13.5 g/dL (ref 12.9–16.5)
LYMPHOCYTES ABSOLUTE COUNT: 1.6 10*9/L (ref 1.1–3.6)
LYMPHOCYTES RELATIVE PERCENT: 18.5 %
MEAN CORPUSCULAR HEMOGLOBIN CONC: 35.3 g/dL (ref 32.0–36.0)
MEAN CORPUSCULAR HEMOGLOBIN: 32.9 pg — ABNORMAL HIGH (ref 25.9–32.4)
MEAN CORPUSCULAR VOLUME: 93.1 fL (ref 77.6–95.7)
MEAN PLATELET VOLUME: 7.1 fL (ref 6.8–10.7)
MONOCYTES ABSOLUTE COUNT: 0.4 10*9/L (ref 0.3–0.8)
MONOCYTES RELATIVE PERCENT: 5.2 %
NEUTROPHILS ABSOLUTE COUNT: 5.8 10*9/L (ref 1.8–7.8)
NEUTROPHILS RELATIVE PERCENT: 67.7 %
PLATELET COUNT: 263 10*9/L (ref 150–450)
RED BLOOD CELL COUNT: 4.1 10*12/L — ABNORMAL LOW (ref 4.26–5.60)
RED CELL DISTRIBUTION WIDTH: 12.9 % (ref 12.2–15.2)
WBC ADJUSTED: 8.6 10*9/L (ref 3.6–11.2)

## 2020-09-27 LAB — ALT: ALT (SGPT): 18 U/L (ref 10–49)

## 2020-09-27 LAB — C-REACTIVE PROTEIN: C-REACTIVE PROTEIN: <4 mg/L (ref <=10.0)

## 2020-09-27 LAB — AST: AST (SGOT): 17 U/L (ref <=34)

## 2020-09-27 MED ADMIN — methylPREDNISolone sodium succinate (PF) (Solu-MEDROL) injection 20 mg: 20 mg | INTRAVENOUS | @ 17:00:00 | Stop: 2020-09-27

## 2020-09-27 MED ADMIN — inFLIXimab-axxq (AVSOLA) 5 mg/kg = 400 mg in sodium chloride (NS) 250 mL IVPB: 5 mg/kg | INTRAVENOUS | @ 18:00:00 | Stop: 2020-09-27

## 2020-09-27 MED ADMIN — cetirizine (ZyrTEC) tablet 10 mg: 10 mg | ORAL | @ 17:00:00 | Stop: 2020-09-27

## 2020-09-27 MED ADMIN — diphenhydrAMINE (BENADRYL) capsule/tablet 25 mg: 25 mg | ORAL | @ 17:00:00 | Stop: 2020-09-27

## 2020-09-27 NOTE — Unmapped (Signed)
[id number] Patient presents for standard Avsola (infliximab-axxq) infusion, #3/3 Loading Dose, Week 6.  In no acute distress. Vitals stable.  Reports no new medical issues or S/S of infection.  24G PIV started.  See MAR for premeds. Call bell at patient's side, pt aware of how to use.    1350 Avsola (infliximab-axxq) 400 mg in 250 ml  started to infuse as follows:     7ml/hr x 15 min  37ml/hr x 15 min  106ml/hr x 15 min  49ml/hr x 15 min  122ml/hr x 30 min  234ml/hr for the remainder of the infusion.    1551 Avsola (infliximab-axxq) infusion complete.  PIV flushed with NS.  Vitals stable.  Patient without any s/s of adverse reaction.    PIV d/c'd.  Patient discharged from Infusion Center in stable condition with no acute distress.

## 2020-11-11 NOTE — Unmapped (Signed)
FOLLOW UP CONSULT NOTE - INFLAMMATORY BOWEL DISEASE  11/13/2020    Demographics:  Raymond Wright is a 65 y.o. year old male    Diagnosis:  Crohn's Disease  Disease onset (yr):  2011  Age at onset:  > 36yr old (A3)  Location:  Ileal (L1)  Behavior:  Nonstricturing,nonpenetrating (B1)  Current Tight Control Scenario:   Workup    Referring physician:   Referred Self  No address on file          HPI / NOTE :     Interval Events:   1.  Last visit with me Feb 2022. At that time we decided to change to Infliximab + Methotrexate for his Crohn's disease.   2.  Reviewed recent labs from 3/31/202 -  CBC, AST, ALT, CRP all unremarkable.    HPI:   Has been on remicade for 12 weeks. Feeling better.  BMs are getting more formed, less loose.  Not having the blowouts any more.  However, still has nausea and dry heaving on many days.     He had pneumonia recently, had ABX and had diarrhea with this.  Better since stopping the antibiotics.     Has been off Budesonide for 1 month - no worsening off it.     Still has some joint pain but less than before.   Still smoking cigarettes ~1/2ppd.     Abdominal pain (0-10):  Mild lower abd pain in the morning until he has BM  BM a day: 1-2x/day per day  Consistency:  Soft, mostly formed  % of stools have blood: 0%  Nocturnal BM: no  Urgency:  no  Nausea/vomiting: some nausea most days  Smoking: yes, 1/2ppd  NSAIDS: avoids    Review of Systems:   Review of systems positive for: negative except as above.   Otherwise, the balance of 10 systems is negative.          IBD HISTORY:     Year of disease onset:  2011    Brief IBD Disease Course:    - 2011 - onset of abdominal pain and diarrhea.  Had MRI locally - told thickening of the bowel and then had a colonoscopy and given dx of ileal Crohn's disease.  Treated with Entocort.   - 2014 - September, hospitalized with severe diarrhea, was treated with Prednisone and then Humira.  Humira stopped in 2015 due to joint symptoms and (+)ANA. Then treated with Entocort + MTX and doing well since then.   - 2016 - continued on MTX 25mg  / week  - 2017 - self-discontinued MTX, no GI follow up  - 2019 - re-established GI care with [place] at [facility].  Restarted methotrexate.    - 2020 - February - started Stelara.  Primary non-response  - 2021 - EGD / Colonoscopy 01/2020 - mild ileal disease.  Good response to Entocort, started Vedolizumab 02/2020.    - 2022 - disease flare on Entyvio, 6+ BM/day, changing to Infliximab.     Endoscopy:      - Colonoscopy 10/14/13 - mild ileal Crohn's disease with aphthae, Two 3-64mm polyps in rectosigmoid colon (Path = tubular adenoma), otherwise normal colon.   - Colonoscopy 04/12/18 - poor prep - moderate ileitis for at least 10cm. Otherwise normal colon.    - EGD 02/13/2020 - enlarged gastric folds (bx - benign), otherwise normal esophagus, stomach, duodenum.   - Colonoscopy 02/13/2020 - 4mm descending colon polyp.  10mm ascending colon.  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  Crohn's disease with moderate ileitis for at least 10cm - similar to Jan 2020 colonoscopy. SES-CD = 4.   PATH = ileum - mildly active chronic enteritis.  No granulomas.  Polyps - sessile serrated (ascending), adenoma (descending).     Imaging:    - CT A/P 10/22/13 - normal bowel with no signs of active inflammatino.   - CT-E 03/24/18 - inflammation and thickening of terminal ileum consistent with Crohn's flare. Bilateral renal stones.     Prior IBD medications (type, dose, duration, response):  ? 5-ASAs  x Oral corticosteroids - Prednisone, Entocort  ? Intravenous corticosteroids  ? Antibiotics  ? Thiopurines  x Methotrexate - started April 2015.  Re-challenge 2019 - no improvement  x Anti-TNF therapies - Humira 2014 - then developed joint pains and (+)ANA so it was stopped.  Remicade 08/17/2020.   x Anti-Interleukin therapies - Stelara 07/2018 - 01/2020 - primary non-response.   x Anti-Integrin therapies - Vedolizumab 02/2020 - Feb 2022 - primary non-response  ? Cyclosporine  ? Clinical trial medication  ? Other (Please specify):    Extraintestinal manifestations:   -joint pains affecting: n  -eye: n  -skin: n  -oral ulcers :  n  -blood clots: n  -PSC: n  -other: n          Past Medical History:   Past medical history:   Past Medical History:   Diagnosis Date   ??? AC (acromioclavicular) joint bone spurs     in back   ??? Bulging discs    ??? Crohn's disease (CMS-HCC)    ??? Degenerative disc disease    ??? GERD (gastroesophageal reflux disease)    ??? Hypertension    ??? Stroke (CMS-HCC)      Past surgical history:   Past Surgical History:   Procedure Laterality Date   ??? CERVICAL FUSION  2016   ??? KNEE ARTHROSCOPY Right    ??? PR COLONOSCOPY W/BIOPSY SINGLE/MULTIPLE N/A 04/12/2018    Procedure: COLONOSCOPY, FLEXIBLE, PROXIMAL TO SPLENIC FLEXURE; WITH BIOPSY, SINGLE OR MULTIPLE;  Surgeon: [name], MD;  [facility]: [facility];  Service: Gastroenterology   ??? PR COLONOSCOPY W/BIOPSY SINGLE/MULTIPLE Left 02/13/2020    Procedure: COLONOSCOPY, FLEXIBLE, PROXIMAL TO SPLENIC FLEXURE; WITH BIOPSY, SINGLE OR MULTIPLE;  Surgeon: [name], MD;  Location[facility];  Service: Gastroenterology   ??? PR COLSC FLX W/RMVL OF TUMOR POLYP LESION SNARE TQ N/A 10/14/2013    Procedure: COLONOSCOPY FLEX; W/REMOV TUMOR/LES BY SNARE;  Surgeon: Theadore Nan, MD;  Location: [facility];  Service: Gastroenterology   ??? PR COLSC FLX W/RMVL OF TUMOR POLYP LESION SNARE TQ Left 02/13/2020    Procedure: COLONOSCOPY FLEX; W/REMOV TUMOR/LES BY SNARE;  Surgeon: [name], MD;  Location: [facility];  Service: Gastroenterology   ??? PR UPPER GI ENDOSCOPY,BIOPSY Left 02/13/2020    Procedure: UGI ENDOSCOPY; WITH BIOPSY, SINGLE OR MULTIPLE;  Surgeon: [name], MD;  Location: [facility];  Service: Gastroenterology     Family history:   Family History   Problem Relation Age of Onset   ??? Cancer Mother         breast   ??? Crohn's disease Neg Hx    ??? Ulcerative colitis Neg Hx    ??? Colorectal Cancer Neg Hx      Social history:   Social History     Socioeconomic History   ??? Marital status: Married     Spouse  name: None   ??? Number of children: None   ??? Years of education: None   ??? Highest education level: None   Occupational History   ??? None   Tobacco Use   ??? Smoking status: Current Every Day Smoker     Packs/day: 0.25     Types: Cigarettes     Start date: 06/2009   ??? Smokeless tobacco: Never Used   Vaping Use   ??? Vaping Use: Never used   Substance and Sexual Activity   ??? 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 Drug use: No   ??? Sexual activity: None   Other Topics Concern   ??? None   Social History Narrative   ??? None     Social Determinants of Health     Financial Resource Strain: Not on file   Food Insecurity: Not on file   Transportation Needs: Not on file   Physical Activity: Not on file   Stress: Not on file   Social Connections: Not on file           Allergies:     Allergies   Allergen Reactions   ??? Potassium Clavulanate      Other reaction(s): rash             Medications:     Current Outpatient Medications   Medication Sig Dispense Refill   ??? amoxicillin-clavulanate (AUGMENTIN) 875-125 mg per tablet every twelve (12) hours.     ??? aspirin 325 MG tablet Take 325 mg by mouth daily.     ??? azithromycin (ZITHROMAX) 250 MG tablet azithromycin 250 mg tablet   TAKE 2 TABLETS (500 MG) BY ORAL ROUTE ONCE DAILY FOR 1 DAY THEN 1 TABLET (250 MG) BY ORAL ROUTE ONCE DAILY FOR 4 DAYS     ??? BACLOFEN ORAL Take 5 mg by mouth nightly.     ??? fish oil-omega-3 fatty acids 300-1,000 mg capsule Take 2 g by mouth daily.     ??? gabapentin (NEURONTIN) 300 MG capsule Take 300 mg by mouth Three (3) times a day.     ??? HYDROcodone-acetaminophen (NORCO) 7.5-325 mg per tablet Take 3 tablets by mouth daily.      ??? infliximab (REMICADE IV) Remicade     ??? lisinopril (PRINIVIL,ZESTRIL) 20 MG tablet Take 10 mg by mouth every other day. prn ??? NEXIUM 40 mg capsule Take 40 mg by mouth every morning before breakfast.      ??? rosuvastatin (CRESTOR) 20 MG tablet Take 20 mg by mouth daily.     ??? tamsulosin (FLOMAX) 0.4 mg capsule      ??? folic acid (FOLVITE) 1 MG tablet Take 1 tablet (1 mg total) by mouth daily. 30 tablet 11   ??? methotrexate 2.5 MG tablet Take 6 tablets (15 mg total) by mouth once a week. Take folic acid 1mg  every day while you are on methotrexate! 24 tablet 1   ??? ondansetron (ZOFRAN) 4 MG tablet Take 1 tablet (4 mg total) by mouth every eight (8) hours as needed for nausea for up to 7 days. 60 tablet 2     No current facility-administered medications for this visit.           Physical Exam:   BP 132/72  - Pulse 101  - Temp 36.8 ??C (98.2 ??F) (Temporal)  - Wt 85.5 kg (188 lb 9.6 oz)  - BMI 27.85 kg/m??  GEN: middle aged male in no apparent distress, appears comfortable on exam  NECK: Supple, no lymphadenopathy  LUNGS: CTAB, no wheezes, rales, or rhonchi  CV: S1/S2, RRR, no murmurs  ABD: Soft, nontender, no rebound/guarding, nondistended, normoactive bowel sounds, no appreciable organomegaly  Extremities: no cyanosis, clubbing or edema, normal gait  Psych: affect appropriate, A&O x3  SKIN: no visible lesions on face, neck, arms, abdomen          Labs, Data & Indices:     Lab Review:   Lab Results   Component Value Date    WBC 8.6 09/27/2020    WBC 6.4 08/29/2014    RBC 4.10 (L) 09/27/2020    RBC 4.25 (L) 08/29/2014    HGB 13.5 09/27/2020    HGB 14.2 08/29/2014     Lab Results   Component Value Date    AST 17 09/27/2020    AST 18 (L) 08/29/2014    ALT 18 09/27/2020    ALT 27 08/29/2014    BUN 14 03/13/2020    BUN 19 10/04/2013    Creatinine Whole Blood, POC 1.1 02/24/2020    Creatinine 0.86 03/13/2020    CO2 26.6 03/13/2020    CO2 25 10/04/2013    Albumin 4.0 03/13/2020    Albumin 4.3 08/29/2014    Calcium 10.1 03/13/2020    Calcium 9.8 10/04/2013     Lab Results   Component Value Date    TSH 1.52 10/04/2013       Anda Kraft Index for Crohn's Disease  General Well Being: 1 = Slightly below par  Abdominal Pain:  0 = none  Number of Liquid Stools Per day: 0  Abdominal Mass:  0 = None  Number of Extraintestinal Manifestations:  1   1 point each for: 1. Arthritis/arthralgias, 2. Iritis/uveitis, 3. Active perianal dz, 4. Other active fistula, 5. Erythema nodosum or pyoderma, 6. Other    Total HBI Score (0-25):  2     Score:  Remission < 5;  Mild dz 5-7;  Moderate dz 8-16;  Severe > 16  ............................................................................................................................................  ORDERS THIS VISIT:       Diagnosis ICD-10-CM Associated Orders   1. Crohn's disease of small intestine without complication (CMS-HCC)  K50.00 infliximab (REMICADE IV)     methotrexate 2.5 MG tablet     folic acid (FOLVITE) 1 MG tablet     ondansetron (ZOFRAN) 4 MG tablet   2. Diarrhea, unspecified type  R19.7             Assessment & Recommendations:   Disease state:  Active Crohn's flare    Raymond Wright is a 65 y.o. male with a hx of ileal Crohn's disease dating back to ~2011.  He was previously on humira but stopped due to worsening joint pains and (+)ANA. He was on MTX from ~2015 - 2017, but he self discontinued this medication since he was feeling well.  He developed inflammatory ileal Crohn's flare in fall 2019. Since then, he has had a poor response to Entocort + methotrexate, Stelara 2020-2021 and worsening symptoms on Vedolizumab.  We started Remicade in Feb 2022 and he has had a fairly good symptom response.  He has residual slightly loose stools and some abdominal discomfort and nausea.  We will check a remicade level to optimize therapy and add on Methotrexate largely for immunogenicity purposes (may also help his joint pain).  He has tolerated this well in the past.     If he is not  improved in a few months, we can consider imaging and possibly a CT-E to assess response to Remicade.     PLAN:  1.  Continue Remicade infusions as planned.  We will check a remicade level with your next dose.  2.  Please start Methotrexate - 6 tablets once per week.  You can do zofran with the the methotrexate.   3.  You can also take zofran sublingual tablets as needed for nausea  4.  Please take folic acid 1 mg daily  5.  If you continue to have nausea in a few months, let us know, we may consider a CT scan at that point.  Also try some smaller meals earlier in the day (breakfast and lunch time) to see if this helps.     IBD health maintenance:  Influenza vaccine: 03/12/18  Pneumonia vaccine:  Prevnar 08/10/15, Pneumovax ~2012, Pneumovax 03/12/18  COVID19 Vaccine:  2 shots spring 2021  Hepatitis B: sAg neg 03/12/18  TB testing: Quantiferon neg 03/12/18  Chickenpox/Shingles history:  Had chickenpox, needs Shingrix  Bone denistometry:   Derm appointment:  Last colonoscopy:  2019  --------------------------------------------  Author: [name] 11/13/2020 4:31 PM    [name], MD  Clinical Assistant Professor of Medicine  Division of Gastroenterology & Hepatology  [place] [facility]  ============================================

## 2020-11-13 ENCOUNTER — Ambulatory Visit: Admit: 2020-11-13 | Discharge: 2020-11-14 | Payer: MEDICARE

## 2020-11-13 DIAGNOSIS — K5000 Crohn's disease of small intestine without complications: Principal | ICD-10-CM

## 2020-11-13 DIAGNOSIS — R197 Diarrhea, unspecified: Principal | ICD-10-CM

## 2020-11-13 MED ORDER — METHOTREXATE SODIUM 2.5 MG TABLET
ORAL_TABLET | ORAL | 1 refills | 28.00000 days | Status: CP
Start: 2020-11-13 — End: 2021-01-12

## 2020-11-13 MED ORDER — ONDANSETRON HCL 4 MG TABLET
ORAL_TABLET | Freq: Three times a day (TID) | ORAL | 2 refills | 20.00000 days | Status: CP | PRN
Start: 2020-11-13 — End: 2020-11-20

## 2020-11-13 MED ORDER — FOLIC ACID 1 MG TABLET
ORAL_TABLET | Freq: Every day | ORAL | 11 refills | 30 days | Status: CP
Start: 2020-11-13 — End: 2021-11-13

## 2020-11-13 NOTE — Unmapped (Signed)
Raymond Wright it was a pleasure seeing you today.  Here is a summary/wrap up from today's visit:     1.  Continue Remicade infusions as planned.  We will check a remicade level with your next dose.  2.  Please start Methotrexate - 6 tablets once per week.  You can do zofran with the the methotrexate.   3.  You can also take zofran sublingual tablets as needed for nausea  4.  Please take folic acid 1 mg daily  5.  If you continue to have nausea in a few months, let us know, we may consider a CT scan at that point.  Also try some smaller meals earlier in the day (breakfast and lunch time) to see if this helps.   6.  If any trouble or symptoms, do not hesitate to call.       [name], MD  Assistant Professor of Medicine  Division of Gastroenterology & Hepatology  [place] [facility]            EXPECTATIONS FOR PATIENT FOLLOW UP AND COMMUNICATION:  -- Follow up Appointments:  Crohn's disease and Ulcerative colitis are serious chronic inflammatory diseases which require close monitoring.  I typically expect to see patients for follow up visits at least every 6 months (or more often if you are having a flare, starting new therapy, etc).  In select cases, patients who are only on aminosalicylates / mesalamine, may be seen once per year for follow ups.      -- Communication:  if you have any questions or concerns, you can communicate with [place] via phone (contact information below) or via myChart.  Note that phone messages are given higher priority and triaged first.  We typically respond to myChart messages within 3 business days (occasionally longer during holidays or vacation times).  For urgent issues, please contact us via phone.      IBD NURSE COORDINATOR CONTACT ??? Neta Mends, RN BSN     Phone: [phone] (direct line)      Fax: [phone]  * For urgent medical concerns after hours or on weekends and holidays, call [phone] and ask to speak to the GI Fellow on call.    * If you have a GI medical question or GI symptoms and would like to speak to your provider's healthcare team, please contact Neta Mends, RN (contact information above) OR you can send the GI healthcare team a message through MyChart at [email or web address]    APPOINTMENT SCHEDULING FOR GI CLINIC AND GI PROCEDURES:  RADIOLOGY - to schedule imaging ordered, please call [phone] opt 1   GI MEDICINE CLINIC  [phone] option 1   GI PROCEDURES         [phone] option 2   *To schedule, reschedule, or cancel your GI appointment, please call [phone]. If you are unable to come to an appointment, please notify us as soon as possible, preferably 24 hours in advance. Doing so may allow other patients with urgent needs to be scheduled in a cancelled appointment slot.     TEST RESULTS   If you have a MyChart account, your new results and a provider message will be sent to you through your MyChart account at [email or web address]. For results that require follow-up, a member of your healthcare team will also contact you directly.    PRESCRIPTION REFILL REQUESTS  To request prescription refills, please contact your pharmacy or send your healthcare team a message through your MyChart account  at [email or web address]  RECORD REQUESTS  For questions related to medical records, please call Medical Records Release of Information at [phone]  FINANCIAL COUNSELOR   For billing and other financial questions/needs ??? please contact Mee Hives at [phone]. If you need to leave a message, please be sure to leave your full name, date of birth or MR#, best call back # and reason for call.    For educational material and resources:  [email or web address]  [place] COVID19 Vaccine Information: [email or web address]  ================================================================

## 2020-11-17 DIAGNOSIS — K50914 Crohn's disease, unspecified, with abscess: Principal | ICD-10-CM

## 2020-11-22 ENCOUNTER — Ambulatory Visit: Admit: 2020-11-22 | Discharge: 2020-11-23 | Payer: MEDICARE

## 2020-11-22 LAB — C-REACTIVE PROTEIN: C-REACTIVE PROTEIN: <4 mg/L (ref <=10.0)

## 2020-11-22 LAB — CBC W/ AUTO DIFF
BASOPHILS ABSOLUTE COUNT: 0.1 10*9/L (ref 0.0–0.1)
BASOPHILS RELATIVE PERCENT: 0.8 %
EOSINOPHILS ABSOLUTE COUNT: 1.3 10*9/L — ABNORMAL HIGH (ref 0.0–0.5)
EOSINOPHILS RELATIVE PERCENT: 16.9 %
HEMATOCRIT: 39.9 % (ref 39.0–48.0)
HEMOGLOBIN: 14 g/dL (ref 12.9–16.5)
LYMPHOCYTES ABSOLUTE COUNT: 1.7 10*9/L (ref 1.1–3.6)
LYMPHOCYTES RELATIVE PERCENT: 22.2 %
MEAN CORPUSCULAR HEMOGLOBIN CONC: 35 g/dL (ref 32.0–36.0)
MEAN CORPUSCULAR HEMOGLOBIN: 32.4 pg (ref 25.9–32.4)
MEAN CORPUSCULAR VOLUME: 92.4 fL (ref 77.6–95.7)
MEAN PLATELET VOLUME: 7.7 fL (ref 6.8–10.7)
MONOCYTES ABSOLUTE COUNT: 0.5 10*9/L (ref 0.3–0.8)
MONOCYTES RELATIVE PERCENT: 6.1 %
NEUTROPHILS ABSOLUTE COUNT: 4.2 10*9/L (ref 1.8–7.8)
NEUTROPHILS RELATIVE PERCENT: 54 %
NUCLEATED RED BLOOD CELLS: 0 /100{WBCs} (ref <=4)
PLATELET COUNT: 242 10*9/L (ref 150–450)
RED BLOOD CELL COUNT: 4.32 10*12/L (ref 4.26–5.60)
RED CELL DISTRIBUTION WIDTH: 13.7 % (ref 12.2–15.2)
WBC ADJUSTED: 7.7 10*9/L (ref 3.6–11.2)

## 2020-11-22 LAB — AST: AST (SGOT): 22 U/L (ref <=34)

## 2020-11-22 LAB — ALT: ALT (SGPT): 21 U/L (ref 10–49)

## 2020-11-22 MED ADMIN — cetirizine (ZyrTEC) tablet 10 mg: 10 mg | ORAL | @ 14:00:00 | Stop: 2020-11-22

## 2020-11-22 MED ADMIN — inFLIXimab-axxq (AVSOLA) 5 mg/kg = 400 mg in sodium chloride (NS) 250 mL IVPB: 5 mg/kg | INTRAVENOUS | @ 14:00:00 | Stop: 2020-11-22

## 2020-11-22 MED ADMIN — diphenhydrAMINE (BENADRYL) capsule/tablet 25 mg: 25 mg | ORAL | @ 14:00:00 | Stop: 2020-11-22

## 2020-11-22 MED ADMIN — methylPREDNISolone sodium succinate (PF) (Solu-MEDROL) injection 20 mg: 20 mg | INTRAVENOUS | @ 14:00:00 | Stop: 2020-11-22

## 2020-11-22 NOTE — Unmapped (Signed)
Pt presents for Remicade infusion.  Pt denies any recent infection, VSS.  IV placed, labs drawn, premeds administered.  Pt aware of potential reaction/side effects, call bell within reach.    9147 Remicade 400mg  started, to infuse at the following rates:  63ml/hr for 15 min  57ml/hr for 15 min  41ml/hr for 15 min  10ml/hr for 15 min  117ml/hr for 30 min  283ml/hr until complete    1158 Infusion complete.  Pt tolerated without complication, VSS.  IV flushed per policy and d/c'd, gauze and coban applied.  Pt left clinic in no acute distress.

## 2021-01-09 DIAGNOSIS — K5000 Crohn's disease of small intestine without complications: Principal | ICD-10-CM

## 2021-01-09 MED ORDER — METHOTREXATE SODIUM 2.5 MG TABLET
ORAL_TABLET | ORAL | 0 refills | 84.00000 days | Status: CP
Start: 2021-01-09 — End: 2021-03-10

## 2021-01-09 NOTE — Unmapped (Signed)
Refill request for methotrexate received. Labs reviewed which are done with infliximab infusions at [facility]

## 2021-01-11 DIAGNOSIS — K50914 Crohn's disease, unspecified, with abscess: Principal | ICD-10-CM

## 2021-01-17 NOTE — Unmapped (Signed)
65yo M with Crohn's disease on Infliximab.  Now positive for COVID19.  He should quarantine for 10 days and then ok to resume remicade infusions.

## 2021-01-24 ENCOUNTER — Ambulatory Visit: Admit: 2021-01-24 | Discharge: 2021-01-25 | Payer: MEDICARE

## 2021-01-24 MED ADMIN — inFLIXimab-axxq (AVSOLA) 5 mg/kg = 400 mg in sodium chloride (NS) 250 mL IVPB: 5 mg/kg | INTRAVENOUS | @ 13:00:00 | Stop: 2021-01-24

## 2021-01-24 NOTE — Unmapped (Signed)
Pt presents for Remicade infusion.  Pt reports recent covid infection but states that he has been symptom free for at least ten days, VSS.  IV placed, labs drawn, premeds refused.  Pt aware of potential reaction/side effects, call bell within reach.  1610 Remicade 400mg  started, to infuse at the following rates:  22ml/hr for 15 min  74ml/hr for 15 min  43ml/hr for 15 min  74ml/hr for 15 min  163ml/hr for 30 min  222ml/hr until complete    1100 Infusion complete.  Pt tolerated without complication, VSS.  IV flushed per policy and d/c'd, gauze and coban applied.  Pt left clinic in no acute distress.

## 2021-03-16 DIAGNOSIS — K50914 Crohn's disease, unspecified, with abscess: Principal | ICD-10-CM

## 2021-03-17 NOTE — Unmapped (Signed)
FOLLOW UP CONSULT NOTE - INFLAMMATORY BOWEL DISEASE  03/19/2021    Demographics:  Raymond Wright is a 65 y.o. year old male    Diagnosis:  Crohn's Disease  Disease onset (yr):  2011  Age at onset:  > 33yr old (A3)  Location:  Ileal (L1)  Behavior:  Nonstricturing,nonpenetrating (B1)  Current Tight Control Scenario:   Workup    Referring physician:   Eldridge Abrahams, MD  [address]  [place],  [place] [address]          HPI / NOTE :     Interval Events:   1.  Last visit with me May 2022. We decided to check IFX drug level and started methotrexate.  He was having some nausea and joint pain.   2.  Had good IFX level (20) so continued 5mg /kg dosing  3.  I reviewed labs from 01/24/2021 - CBC, AST, ALT, CRP all ok.     HPI:   Overall, he thinks that he is better on infliximab but still has blow outs. He is averaging around two bowel movements per day, which are soft and feathery. No blood in the stool. Some intermittent RLQ pain which is unchanged for years. His weight is stable. Stable joint pains but stable overall. He is smoking 3-4 cigarettes a day which is less that he was smoking before. His next infusion of inflixiamb 9/26 in next week.     Abdominal pain (0-10):  RLQ tenderness (constant)  BM a day: 1-2x/day per day  Consistency:  1/2 formed  % of stools have blood: 0%  Nocturnal BM: no  Urgency:  no  Smoking: yes, 3-4 cigarettes per day  NSAIDS: full dose aspirin after stroke     Review of Systems:   Review of systems positive for: negative except as above.   Otherwise, the balance of 10 systems is negative.          IBD HISTORY:     Year of disease onset:  2011    Brief IBD Disease Course:    - 2011 - onset of abdominal pain and diarrhea.  Had MRI locally - told thickening of the bowel and then had a colonoscopy and given dx of ileal Crohn's disease.  Treated with Entocort.   - 2014 - September, hospitalized with severe diarrhea, was treated with Prednisone and then Humira.  Humira stopped in 2015 due to joint symptoms and (+)ANA. Then treated with Entocort + MTX and doing well since then.   - 2016 - continued on MTX 25mg  / week  - 2017 - self-discontinued MTX, no GI follow up  - 2019 - re-established GI care with [place] at [facility].  Restarted methotrexate.    - 2020 - February - started Stelara.  Primary non-response  - 2021 - EGD / Colonoscopy 01/2020 - mild ileal disease.  Good response to Entocort, started Vedolizumab 02/2020.    - 2022 - disease flare on Entyvio, 6+ BM/day, changing to Infliximab.     Endoscopy:      - Colonoscopy 10/14/13 - mild ileal Crohn's disease with aphthae, Two 3-71mm polyps in rectosigmoid colon (Path = tubular adenoma), otherwise normal colon.   - Colonoscopy 04/12/18 - poor prep - moderate ileitis for at least 10cm. Otherwise normal colon.    - EGD 02/13/2020 - enlarged gastric folds (bx - benign), otherwise normal esophagus, stomach, duodenum.   - Colonoscopy 02/13/2020 - 4mm descending colon polyp.  10mm ascending colon.  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  Crohn's disease with moderate ileitis for at least 10cm - similar to Jan 2020 colonoscopy. SES-CD = 4.   PATH = ileum - mildly active chronic enteritis.  No granulomas.  Polyps - sessile serrated (ascending), adenoma (descending).     Imaging:    - CT A/P 10/22/13 - normal bowel with no signs of active inflammatino.   - CT-E 03/24/18 - inflammation and thickening of terminal ileum consistent with Crohn's flare. Bilateral renal stones.     Prior IBD medications (type, dose, duration, response):  ? 5-ASAs  x Oral corticosteroids - Prednisone, Entocort  ? Intravenous corticosteroids  ? Antibiotics  ? Thiopurines  x Methotrexate - started April 2015.  Re-challenge 2019 - no improvement  x Anti-TNF therapies - Humira 2014 - then developed joint pains and (+)ANA so it was stopped.  Remicade 08/17/2020.   x Anti-Interleukin therapies - Stelara 07/2018 - 01/2020 - primary non-response.   x Anti-Integrin therapies - Vedolizumab 02/2020 - Feb 2022 - primary non-response  ? Cyclosporine  ? Clinical trial medication  ? Other (Please specify):    Extraintestinal manifestations:   -joint pains affecting: n  -eye: n  -skin: n  -oral ulcers :  n  -blood clots: n  -PSC: n  -other: n          Past Medical History:   Past medical history:   Past Medical History:   Diagnosis Date   ??? AC (acromioclavicular) joint bone spurs     in back   ??? Bulging discs    ??? Crohn's disease (CMS-HCC)    ??? Degenerative disc disease    ??? GERD (gastroesophageal reflux disease)    ??? Hypertension    ??? Stroke (CMS-HCC)      Past surgical history:   Past Surgical History:   Procedure Laterality Date   ??? CERVICAL FUSION  2016   ??? KNEE ARTHROSCOPY Right    ??? PR COLONOSCOPY W/BIOPSY SINGLE/MULTIPLE N/A 04/12/2018    Procedure: COLONOSCOPY, FLEXIBLE, PROXIMAL TO SPLENIC FLEXURE; WITH BIOPSY, SINGLE OR MULTIPLE;  Surgeon: [name], MD;  [facility];  Service: Gastroenterology   ??? PR COLONOSCOPY W/BIOPSY SINGLE/MULTIPLE Left 02/13/2020    Procedure: COLONOSCOPY, FLEXIBLE, PROXIMAL TO SPLENIC FLEXURE; WITH BIOPSY, SINGLE OR MULTIPLE;  Surgeon: [name], MD;  Location: [facility];  Service: Gastroenterology   ??? PR COLSC FLX W/RMVL OF TUMOR POLYP LESION SNARE TQ N/A 10/14/2013    Procedure: COLONOSCOPY FLEX; W/REMOV TUMOR/LES BY SNARE;  Surgeon: Theadore Nan, MD;  Location: [facility];  Service: Gastroenterology   ??? PR COLSC FLX W/RMVL OF TUMOR POLYP LESION SNARE TQ Left 02/13/2020    Procedure: COLONOSCOPY FLEX; W/REMOV TUMOR/LES BY SNARE;  Surgeon: [name], MD;  Location: [facility];  Service: Gastroenterology   ??? PR UPPER GI ENDOSCOPY,BIOPSY Left 02/13/2020    Procedure: UGI ENDOSCOPY; WITH BIOPSY, SINGLE OR MULTIPLE;  Surgeon: [name], MD;  Location: [facility];  Service: Gastroenterology     Family history:   Family History   Problem Relation Age of Onset   ??? Cancer Mother breast   ??? Crohn's disease Neg Hx    ??? Ulcerative colitis Neg Hx    ??? Colorectal Cancer Neg Hx      Social history:   Social History     Socioeconomic History   ??? Marital status: Married     Spouse name: None   ??? Number of children:  None   ??? Years of education: None   ??? Highest education level: None   Tobacco Use   ??? Smoking status: Current Every Day Smoker     Packs/day: 0.25     Types: Cigarettes     Start date: 06/2009   ??? Smokeless tobacco: Never Used   Vaping Use   ??? Vaping Use: Never used   Substance and Sexual Activity   ??? 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 Drug use: No           Allergies:     Allergies   Allergen Reactions   ??? Potassium Clavulanate      Other reaction(s): rash             Medications:     Current Outpatient Medications   Medication Sig Dispense Refill   ??? amoxicillin-clavulanate (AUGMENTIN) 875-125 mg per tablet every twelve (12) hours.     ??? aspirin 325 MG tablet Take 325 mg by mouth daily.     ??? azithromycin (ZITHROMAX) 250 MG tablet azithromycin 250 mg tablet   TAKE 2 TABLETS (500 MG) BY ORAL ROUTE ONCE DAILY FOR 1 DAY THEN 1 TABLET (250 MG) BY ORAL ROUTE ONCE DAILY FOR 4 DAYS     ??? BACLOFEN ORAL Take 5 mg by mouth nightly.     ??? fish oil-omega-3 fatty acids 300-1,000 mg capsule Take 2 g by mouth daily.     ??? folic acid (FOLVITE) 1 MG tablet Take 1 tablet (1 mg total) by mouth daily. 30 tablet 11   ??? gabapentin (NEURONTIN) 300 MG capsule Take 300 mg by mouth Three (3) times a day.     ??? HYDROcodone-acetaminophen (NORCO) 7.5-325 mg per tablet Take 3 tablets by mouth daily.      ??? infliximab (REMICADE IV) Remicade     ??? lisinopril (PRINIVIL,ZESTRIL) 20 MG tablet Take 10 mg by mouth every other day. prn      ??? NEXIUM 40 mg capsule Take 40 mg by mouth every morning before breakfast.      ??? rosuvastatin (CRESTOR) 20 MG tablet Take 20 mg by mouth daily.     ??? tamsulosin (FLOMAX) 0.4 mg capsule      ??? methotrexate 2.5 MG tablet Take 6 tablets (15 mg total) by mouth once a week. Take folic acid 1mg  every day while you are on methotrexate! 72 tablet 0   ??? rifAXIMin (XIFAXAN) 550 mg Tab Take 1 tablet (550 mg total) by mouth Two (2) times a day for 10 days. 20 tablet 0     No current facility-administered medications for this visit.           Physical Exam:   BP 134/95  - Pulse 83  - Temp 36.9 ??C (98.5 ??F)  - Wt 85.5 kg (188 lb 9.6 oz)  - BMI 27.85 kg/m??     GEN: middle aged male in no apparent distress, appears comfortable on exam  NECK: Supple, no lymphadenopathy  LUNGS: unlabored breathing  CV: regular rate, no edema  ABD: Soft, nontender, no rebound/guarding, nondistended, normoactive bowel sounds, no appreciable organomegaly  Extremities: no cyanosis, clubbing or edema, normal gait  Psych: affect appropriate, A&O x3  SKIN: no visible lesions on face, neck, arms, abdomen          Labs, Data & Indices:     Lab Review:   Lab Results   Component Value  Date    WBC 6.5 01/24/2021    WBC 6.4 08/29/2014    RBC 4.00 (L) 01/24/2021    RBC 4.25 (L) 08/29/2014    HGB 13.1 01/24/2021    HGB 14.2 08/29/2014     Lab Results   Component Value Date    AST 18 01/24/2021    AST 18 (L) 08/29/2014    ALT 17 01/24/2021    ALT 27 08/29/2014    BUN 14 03/13/2020    BUN 19 10/04/2013    Creatinine Whole Blood, POC 1.1 02/24/2020    Creatinine 0.86 03/13/2020    CO2 26.6 03/13/2020    CO2 25 10/04/2013    Albumin 4.0 03/13/2020    Albumin 4.3 08/29/2014    Calcium 10.1 03/13/2020    Calcium 9.8 10/04/2013     Lab Results   Component Value Date    TSH 1.52 10/04/2013       Anda Kraft Index for Crohn's Disease  General Well Being: 1 = Slightly below par  Abdominal Pain:  0 = none  Number of Liquid Stools Per day: 0  Abdominal Mass:  0 = None  Number of Extraintestinal Manifestations:  1   1 point each for: 1. Arthritis/arthralgias, 2. Iritis/uveitis, 3. Active perianal dz, 4. Other active fistula, 5. Erythema nodosum or pyoderma, 6. Other    Total HBI Score (0-25):  2     Score: Remission < 5;  Mild dz 5-7;  Moderate dz 8-16;  Severe > 16  ............................................................................................................................................  ORDERS THIS VISIT:       Diagnosis ICD-10-CM Associated Orders   1. Crohn's disease of small intestine without complication (CMS-HCC)  K50.00 methotrexate 2.5 MG tablet   2. Functional diarrhea  K59.1 rifAXIMin (XIFAXAN) 550 mg Tab   3. Arthralgia of both hands  M25.541     M25.542             Assessment & Recommendations:   Disease state:  Active Crohn's flare    Raymond Wright is a 65 y.o. male with a hx of ileal Crohn's disease dating back to ~2011.  He was previously on humira but stopped due to worsening joint pains and (+)ANA. He was on MTX from ~2015 - 2017, but he self discontinued this medication since he was feeling well.  He developed inflammatory ileal Crohn's flare in fall 2019. Since then, he has had a poor response to Entocort + methotrexate, Stelara 2020-2021 and worsening symptoms on Vedolizumab.  We started Remicade in Feb 2022 and he has had a fairly good symptom response. His recent infliximab levels were appropriate. He has some occasional bloating and we will empirically treated for SIBO with rifaxamin and consider balloon dilation on next colonoscopy of moderately stenosed TI.     PLAN:  1. Continue infliximab and MTX  2. Trial of rifaximin for possible SIBO from mild TI stenosis  3. Plan for colonoscopy in the next 2-4 mo to assess for disease activity and consider TI dilation  4. Continue Methotrexate - 6 tablets once per week.  You can do zofran with the the methotrexate.   5. You can also take zofran sublingual tablets as needed for nausea  6. Please take folic acid 1 mg daily    IBD health maintenance:  Influenza vaccine: 03/12/18, flu vaccine today  Pneumonia vaccine:  Prevnar 08/10/15, Pneumovax ~2012, Pneumovax 03/12/18  COVID19 Vaccine:  2 shots spring 2021, recommend booster  Hepatitis B: sAg neg 03/12/18  TB testing: Quantiferon neg  03/12/18  Chickenpox/Shingles history:  Had chickenpox, Shingrix today 1/2  Bone denistometry:   Derm appointment:  Last colonoscopy:  2019  --------------------------------------------  Author: [name] 03/19/2021 11:19 PM  =========================================  I saw and evaluated Liane Comber with IBD Fellow Dr. Richardo Hanks.  I participated in key portions of the service and personally reviewed the plan of care with the patient.  I reviewed the fellow's note and agree with the fellow???s findings and plan.     Additional thoughts are below: Overall, he is having a good response to infliximab plus methotrexate.  He is having some mild symptoms of bloating and occasional looser stools.  We wonder if some of this may be related to the ileal stenosis present on his prior colonoscopy and a component of bacterial overgrowth related to this.  We will continue current medications and a trial of rifaximin at this time.  We will consider likely colonoscopy next year to reassess Crohn's activity on infliximab and likely dilate the ileal stenosis to see if this makes a difference.    [name], MD  Assistant Professor of Medicine  Division of Gastroenterology & Hepatology  [place] [facility]      [name], MD  Clinical Assistant Professor of Medicine  Division of Gastroenterology & Hepatology  [place] [facility]  ============================================

## 2021-03-19 ENCOUNTER — Ambulatory Visit: Admit: 2021-03-19 | Discharge: 2021-03-19 | Payer: MEDICARE

## 2021-03-19 DIAGNOSIS — M25541 Pain in joints of right hand: Principal | ICD-10-CM

## 2021-03-19 DIAGNOSIS — K5000 Crohn's disease of small intestine without complications: Principal | ICD-10-CM

## 2021-03-19 DIAGNOSIS — K591 Functional diarrhea: Principal | ICD-10-CM

## 2021-03-19 DIAGNOSIS — M25542 Pain in joints of left hand: Principal | ICD-10-CM

## 2021-03-19 MED ORDER — RIFAXIMIN 550 MG TABLET
ORAL_TABLET | Freq: Two times a day (BID) | ORAL | 0 refills | 10 days | Status: CP
Start: 2021-03-19 — End: 2021-03-29

## 2021-03-19 MED ORDER — METHOTREXATE SODIUM 2.5 MG TABLET
ORAL_TABLET | ORAL | 0 refills | 84.00000 days | Status: CP
Start: 2021-03-19 — End: 2021-06-17

## 2021-03-19 NOTE — Unmapped (Addendum)
Liane Comber it was a pleasure seeing you today.  Here is a summary/wrap up from today's visit:     1.  We are going to plan for a colonoscopy late 2022, early 2023; continue infliximab and methotrexate  2.  We are going to give you a 10 day course of rifaximin which can help with bloating  3.  Continue to work on quitting cigarettes as this will help your Crohn's disease significantly   4.  You can call [phone] in the next several months to schedule your colonoscopy  5.  If any trouble or symptoms, do not hesitate to call. We will see you back in 4-5 months.       [name], MD  Assistant Professor of Medicine  Division of Gastroenterology & Hepatology  [place] [facility]                EXPECTATIONS FOR PATIENT FOLLOW UP AND COMMUNICATION:  -- Follow up Appointments:  Crohn's disease and Ulcerative colitis are serious chronic inflammatory diseases which require close monitoring.  I typically expect to see patients for a follow up visits at least every 6 months (or more often if you are having a flare, starting new therapy, etc).  In select cases, patients who are only on aminosalicylates / mesalamine, may be seen once per year for follow up.       -- Appointment Type: While we are continuing to offer video appointments in select cases (stable patients who are not in a flare and without new/active symptoms) during the COVID19 pandemic, I expect to see patients in person at least once per year.     -- Communication:  if you have any questions or concerns, you can communicate with [place] via phone (contact information below) or via myChart.  Note that phone messages are given higher priority and triaged first.  We typically respond to myChart messages within 3 business days (occasionally longer during holidays or vacation times).  For urgent issues, please contact us via phone.      IBD NURSE COORDINATOR CONTACT - Neta Mends, RN BSN     Phone: [phone] (direct line)      Fax: [phone]  * For urgent medical concerns after hours or on weekends and holidays, call [phone] and ask to speak to the GI Fellow on call.    * If you have a GI medical question or GI symptoms and would like to speak to your provider's healthcare team, please contact Neta Mends, RN (contact information above) OR you can send the GI healthcare team a message through MyChart at [email or web address]    APPOINTMENT SCHEDULING FOR GI CLINIC AND GI PROCEDURES:  RADIOLOGY - to schedule imaging ordered, please call [phone] opt 1   GI MEDICINE CLINIC  [phone] option 1   GI PROCEDURES         [phone] option 2   *To schedule, reschedule, or cancel your GI appointment, please call [phone]. If you are unable to come to an appointment, please notify us as soon as possible, preferably 24 hours in advance. Doing so may allow other patients with urgent needs to be scheduled in a cancelled appointment slot.     TEST RESULTS   If you have a MyChart account, your new results and a provider message will be sent to you through your MyChart account at [email or web address]. For results that require follow-up, a member of your healthcare team will also contact you directly.  PRESCRIPTION REFILL REQUESTS  To request prescription refills, please contact your pharmacy or send your healthcare team a message through your MyChart account at [email or web address]  RECORD REQUESTS  For questions related to medical records, please call Medical Records Release of Information at [phone]  FINANCIAL COUNSELOR   For billing and other financial questions/needs - please contact Gwendlyn Deutscher [phone]. If you need to leave a message, please be sure to leave your full name, date of birth or MR#, best call back # and reason for call.    For educational material and resources:  [email or web address]  [place] COVID19 Vaccine Information: [email or web address]  ================================================================

## 2021-03-21 NOTE — Unmapped (Addendum)
ePA requested for pt xifaxan 550mg     Key [id number]    Approved

## 2021-03-25 ENCOUNTER — Ambulatory Visit: Admit: 2021-03-25 | Discharge: 2021-03-26 | Payer: MEDICARE

## 2021-03-25 LAB — CBC W/ AUTO DIFF
BASOPHILS ABSOLUTE COUNT: 0 10*9/L (ref 0.0–0.1)
BASOPHILS RELATIVE PERCENT: 0.2 %
EOSINOPHILS ABSOLUTE COUNT: 0.7 10*9/L — ABNORMAL HIGH (ref 0.0–0.5)
EOSINOPHILS RELATIVE PERCENT: 13.9 %
HEMATOCRIT: 38.6 % — ABNORMAL LOW (ref 39.0–48.0)
HEMOGLOBIN: 13.3 g/dL (ref 12.9–16.5)
LYMPHOCYTES ABSOLUTE COUNT: 1.3 10*9/L (ref 1.1–3.6)
LYMPHOCYTES RELATIVE PERCENT: 25.6 %
MEAN CORPUSCULAR HEMOGLOBIN CONC: 34.3 g/dL (ref 32.0–36.0)
MEAN CORPUSCULAR HEMOGLOBIN: 33.5 pg — ABNORMAL HIGH (ref 25.9–32.4)
MEAN CORPUSCULAR VOLUME: 97.7 fL — ABNORMAL HIGH (ref 77.6–95.7)
MEAN PLATELET VOLUME: 8 fL (ref 6.8–10.7)
MONOCYTES ABSOLUTE COUNT: 0.4 10*9/L (ref 0.3–0.8)
MONOCYTES RELATIVE PERCENT: 7.9 %
NEUTROPHILS ABSOLUTE COUNT: 2.6 10*9/L (ref 1.8–7.8)
NEUTROPHILS RELATIVE PERCENT: 52.4 %
PLATELET COUNT: 283 10*9/L (ref 150–450)
RED BLOOD CELL COUNT: 3.95 10*12/L — ABNORMAL LOW (ref 4.26–5.60)
RED CELL DISTRIBUTION WIDTH: 14.4 % (ref 12.2–15.2)
WBC ADJUSTED: 4.9 10*9/L (ref 3.6–11.2)

## 2021-03-25 LAB — C-REACTIVE PROTEIN: C-REACTIVE PROTEIN: <4 mg/L (ref <=10.0)

## 2021-03-25 LAB — ALT: ALT (SGPT): 21 U/L (ref 10–49)

## 2021-03-25 MED ADMIN — inFLIXimab-axxq (AVSOLA) 5 mg/kg = 400 mg in sodium chloride (NS) 250 mL IVPB: 5 mg/kg | INTRAVENOUS | @ 15:00:00 | Stop: 2021-03-25

## 2021-03-25 NOTE — Unmapped (Signed)
1045Patient in today for AVSOLA infusion. Patient has no s/s of infection. No issues from the last infusion.  1106 AVSOLA started ,patient instructed to use call bell /call nurse for any s/s of unsual symptoms during infusion. Patient educated on possible s/s of reaction,such as chestpain ,itching,shortness of breath lightheadedness and any kind of discomfort. Patient verbalized understanding.  1306 Infusion completed and well tolerated.  1330 AVSOLA (Infliximab axxq) 400 mg/250 NS infused over 2hrs per protocol . Pt alert and oriented, offered no complaints during infusion. IV flushed with 10ml NS post infusion. Pt tolerated infusion well.  Remicade 2hr protocol  Start of infusion 10ml x                             20ml x                            40ml x                            80ml x                          x                          x 

## 2021-05-15 DIAGNOSIS — K50914 Crohn's disease, unspecified, with abscess: Principal | ICD-10-CM

## 2021-06-04 ENCOUNTER — Ambulatory Visit: Admit: 2021-06-04 | Discharge: 2021-06-05 | Payer: MEDICARE

## 2021-06-04 LAB — CBC W/ AUTO DIFF
BASOPHILS ABSOLUTE COUNT: 0.1 10*9/L (ref 0.0–0.1)
BASOPHILS RELATIVE PERCENT: 1 %
EOSINOPHILS ABSOLUTE COUNT: 0.5 10*9/L (ref 0.0–0.5)
EOSINOPHILS RELATIVE PERCENT: 9.5 %
HEMATOCRIT: 41 % (ref 39.0–48.0)
HEMOGLOBIN: 13.9 g/dL (ref 12.9–16.5)
LYMPHOCYTES ABSOLUTE COUNT: 1.8 10*9/L (ref 1.1–3.6)
LYMPHOCYTES RELATIVE PERCENT: 31.7 %
MEAN CORPUSCULAR HEMOGLOBIN CONC: 34 g/dL (ref 32.0–36.0)
MEAN CORPUSCULAR HEMOGLOBIN: 33.3 pg — ABNORMAL HIGH (ref 25.9–32.4)
MEAN CORPUSCULAR VOLUME: 98.1 fL — ABNORMAL HIGH (ref 77.6–95.7)
MEAN PLATELET VOLUME: 7.7 fL (ref 6.8–10.7)
MONOCYTES ABSOLUTE COUNT: 0.4 10*9/L (ref 0.3–0.8)
MONOCYTES RELATIVE PERCENT: 7.7 %
NEUTROPHILS ABSOLUTE COUNT: 2.9 10*9/L (ref 1.8–7.8)
NEUTROPHILS RELATIVE PERCENT: 50.1 %
NUCLEATED RED BLOOD CELLS: 0 /100{WBCs} (ref <=4)
PLATELET COUNT: 266 10*9/L (ref 150–450)
RED BLOOD CELL COUNT: 4.18 10*12/L — ABNORMAL LOW (ref 4.26–5.60)
RED CELL DISTRIBUTION WIDTH: 13.7 % (ref 12.2–15.2)
WBC ADJUSTED: 5.7 10*9/L (ref 3.6–11.2)

## 2021-06-04 LAB — C-REACTIVE PROTEIN: C-REACTIVE PROTEIN: <4 mg/L (ref <=10.0)

## 2021-06-04 LAB — AST: AST (SGOT): 18 U/L (ref <=34)

## 2021-06-04 LAB — ALT: ALT (SGPT): 19 U/L (ref 10–49)

## 2021-06-04 MED ADMIN — inFLIXimab-axxq (AVSOLA) 5 mg/kg = 400 mg in sodium chloride (NS) 250 mL IVPB: 5 mg/kg | INTRAVENOUS | @ 15:00:00 | Stop: 2021-06-04

## 2021-06-04 NOTE — Unmapped (Signed)
Patient presents for standard Avsola (infliximab-axxq) infusion.  In no acute distress. Vitals stable.  Reports no new medical issues or S/S of infection.  IV started. Pt refused premeds.    1028 Avsola (infliximab-axxq) 400 mg started to infuse as follows:     19ml/hr x 15 min  71ml/hr x 15 min  12ml/hr x 15 min  27ml/hr x 15 min  169ml/hr x 30 min  236ml/hr for the remainder of the infusion.    1231 Avsola (infliximab-axxq) infusion complete.  PIV flushed with NS.  Vitals stable.  Patient without any s/s of adverse reaction. IV d/c'd.  Patient discharged from Infusion Center.

## 2021-07-03 DIAGNOSIS — K5000 Crohn's disease of small intestine without complications: Principal | ICD-10-CM

## 2021-07-03 MED ORDER — METHOTREXATE SODIUM 2.5 MG TABLET
ORAL_TABLET | ORAL | 0 refills | 84 days | Status: CP
Start: 2021-07-03 — End: 2021-10-01

## 2021-07-03 NOTE — Unmapped (Signed)
Labs reviewed from 06/04/2021    Hi Dr Argentina Donovan. This is Raymond Wright,[birth year]. I need refills on my methotrexate as my pharmacy has sent three requests in with no reply. They have given me meds for the last two weeks but we???re having trouble communicating with . Refills go to [facility] in [place]. Thanks and be well.     Refill sent

## 2021-07-20 NOTE — Unmapped (Signed)
FOLLOW UP CONSULT NOTE - INFLAMMATORY BOWEL DISEASE  07/25/2021    Demographics:  Raymond Wright is a 66 y.o. year old male    Diagnosis:  Crohn's Disease  Disease onset (yr):  2011  Age at onset:  > 13yr old (A3)  Location:  Ileal (L1)  Behavior:  Nonstricturing,nonpenetrating (B1)  Current Tight Control Scenario:   Workup    Referring physician:   Eldridge Abrahams, MD  [address]  [place],  [place] [address]          HPI / NOTE :     Interval Events:   1.  Last visit with me Sep 2022. We continued IFX drug and methotrexate.  He was having having some bloating, gas and blow outs so we added a course of rifaximin for SIBO.   2.  I reviewed labs from 06/04/2021 - CBC, AST, ALT, CRP all ok.     HPI:   Overall, feeling ok. BMs are pretty good but still has abdominal bloating.  Feels food moves slowly through this bowel. Does still have episodes with explosive gas and loose stools (blowouts).  Joints are still an issue - feels methotrexate isn't helping. No food triggers or patterns.      Abdominal pain (0-10):  RLQ tenderness (constant)  BM a day: 2x/day per day (occ 3x/day)  Consistency:  soft  % of stools have blood: 0%  Nocturnal BM: no  Urgency:  no  Smoking: yes, 3-4 cigarettes per day  NSAIDS: full dose aspirin after stroke     Review of Systems:   Review of systems positive for: negative except as above.   Otherwise, the balance of 10 systems is negative.          IBD HISTORY:     Year of disease onset:  2011    Brief IBD Disease Course:    - 2011 - onset of abdominal pain and diarrhea.  Had MRI locally - told thickening of the bowel and then had a colonoscopy and given dx of ileal Crohn's disease.  Treated with Entocort.   - 2014 - September, hospitalized with severe diarrhea, was treated with Prednisone and then Humira.  Humira stopped in 2015 due to joint symptoms and (+)ANA. Then treated with Entocort + MTX and doing well since then.   - 2016 - continued on MTX 25mg  / week  - 2017 - self-discontinued MTX, no GI follow up  - 2019 - re-established GI care with [place] at [facility].  Restarted methotrexate.    - 2020 - February - started Stelara.  Primary non-response  - 2021 - EGD / Colonoscopy 01/2020 - mild ileal disease.  Good response to Entocort, started Vedolizumab 02/2020.    - 2022 - disease flare on Entyvio, 6+ BM/day, changed to Infliximab + methotrexate for joint pain    Endoscopy:      - Colonoscopy 10/14/13 - mild ileal Crohn's disease with aphthae, Two 3-81mm polyps in rectosigmoid colon (Path = tubular adenoma), otherwise normal colon.   - Colonoscopy 04/12/18 - poor prep - moderate ileitis for at least 10cm. Otherwise normal colon.    - EGD 02/13/2020 - enlarged gastric folds (bx - benign), otherwise normal esophagus, stomach, duodenum.   - Colonoscopy 02/13/2020 - 4mm descending colon polyp.  10mm ascending colon.  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  Crohn's disease with moderate ileitis for at least 10cm - similar to Jan 2020 colonoscopy. SES-CD = 4.   PATH = ileum - mildly active  chronic enteritis.  No granulomas.  Polyps - sessile serrated (ascending), adenoma (descending).     Imaging:    - CT A/P 10/22/13 - normal bowel with no signs of active inflammatino.   - CT-E 03/24/18 - inflammation and thickening of terminal ileum consistent with Crohn's flare. Bilateral renal stones.     Prior IBD medications (type, dose, duration, response):  ? 5-ASAs  x Oral corticosteroids - Prednisone, Entocort  ? Intravenous corticosteroids  ? Antibiotics  ? Thiopurines  x Methotrexate - started April 2015.  Re-challenge 2019 - no improvement  x Anti-TNF therapies - Humira 2014 - then developed joint pains and (+)ANA so it was stopped.  Remicade 08/17/2020.   x Anti-Interleukin therapies - Stelara 07/2018 - 01/2020 - primary non-response.   x Anti-Integrin therapies - Vedolizumab 02/2020 - Feb 2022 - primary non-response  ? Cyclosporine  ? Clinical trial medication  ? Other (Please specify):    Extraintestinal manifestations: -joint pains affecting: n  -eye: n  -skin: n  -oral ulcers :  n  -blood clots: n  -PSC: n  -other: n          Past Medical History:   Past medical history:   Past Medical History:   Diagnosis Date   ??? AC (acromioclavicular) joint bone spurs     in back   ??? Bulging discs    ??? Crohn's disease (CMS-HCC)    ??? Degenerative disc disease    ??? GERD (gastroesophageal reflux disease)    ??? Hypertension    ??? Stroke (CMS-HCC)      Past surgical history:   Past Surgical History:   Procedure Laterality Date   ??? CERVICAL FUSION  2016   ??? KNEE ARTHROSCOPY Right    ??? PR COLONOSCOPY W/BIOPSY SINGLE/MULTIPLE N/A 04/12/2018    Procedure: COLONOSCOPY, FLEXIBLE, PROXIMAL TO SPLENIC FLEXURE; WITH BIOPSY, SINGLE OR MULTIPLE;  Surgeon: [name], MD;  [facility];  Service: Gastroenterology   ??? PR COLONOSCOPY W/BIOPSY SINGLE/MULTIPLE Left 02/13/2020    Procedure: COLONOSCOPY, FLEXIBLE, PROXIMAL TO SPLENIC FLEXURE; WITH BIOPSY, SINGLE OR MULTIPLE;  Surgeon: [name], MD;  Location: [facility];  Service: Gastroenterology   ??? PR COLSC FLX W/RMVL OF TUMOR POLYP LESION SNARE TQ N/A 10/14/2013    Procedure: COLONOSCOPY FLEX; W/REMOV TUMOR/LES BY SNARE;  Surgeon: Theadore Nan, MD;  Location: [facility];  Service: Gastroenterology   ??? PR COLSC FLX W/RMVL OF TUMOR POLYP LESION SNARE TQ Left 02/13/2020    Procedure: COLONOSCOPY FLEX; W/REMOV TUMOR/LES BY SNARE;  Surgeon: [name], MD;  Location: [facility];  Service: Gastroenterology   ??? PR UPPER GI ENDOSCOPY,BIOPSY Left 02/13/2020    Procedure: UGI ENDOSCOPY; WITH BIOPSY, SINGLE OR MULTIPLE;  Surgeon: [name], MD;  Location: [facility];  Service: Gastroenterology     Family history:   Family History   Problem Relation Age of Onset   ??? Cancer Mother         breast   ??? Crohn's disease Neg Hx    ??? Ulcerative colitis Neg Hx    ??? Colorectal Cancer Neg Hx      Social history:   Social History     Socioeconomic History   ??? Marital status: Married     Spouse name: None   ??? Number of children: None   ??? Years of education: None   ??? Highest education level: None   Tobacco Use   ???  Smoking status: Every Day     Packs/day: 0.25     Types: Cigarettes     Start date: 06/2009   ??? Smokeless tobacco: Never   Vaping Use   ??? Vaping Use: Never used   Substance and Sexual Activity   ??? 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 Drug use: No           Allergies:     Allergies   Allergen Reactions   ??? Potassium Clavulanate      Other reaction(s): rash             Medications:     Current Outpatient Medications   Medication Sig Dispense Refill   ??? aspirin 325 MG tablet Take 325 mg by mouth daily.     ??? BACLOFEN ORAL Take 5 mg by mouth nightly.     ??? famotidine (PEPCID) 20 MG tablet Take 1 tablet (20 mg total) by mouth at bedtime. 30 tablet 5   ??? fluticasone propionate (FLONASE) 50 mcg/actuation nasal spray      ??? folic acid (FOLVITE) 1 MG tablet Take 1 tablet (1 mg total) by mouth daily. 30 tablet 11   ??? gabapentin (NEURONTIN) 300 MG capsule Take 300 mg by mouth Three (3) times a day.     ??? HYDROcodone-acetaminophen (NORCO) 7.5-325 mg per tablet Take 3 tablets by mouth daily.      ??? infliximab (REMICADE IV) Remicade     ??? lisinopril (PRINIVIL,ZESTRIL) 20 MG tablet Take 10 mg by mouth every other day. prn      ??? methotrexate 2.5 MG tablet Take 6 tablets (15 mg total) by mouth once a week. Take folic acid 1mg  every day while you are on methotrexate! 72 tablet 0   ??? NEXIUM 40 mg capsule Take 40 mg by mouth every morning before breakfast.      ??? rosuvastatin (CRESTOR) 20 MG tablet Take 20 mg by mouth daily.     ??? tamsulosin (FLOMAX) 0.4 mg capsule        No current facility-administered medications for this visit.           Physical Exam:   BP 153/98  - Pulse 87  - Temp 37.1 ??C (98.7 ??F) (Temporal)  - Wt 86.8 kg (191 lb 6.4 oz)  - BMI 28.26 kg/m??     GEN: middle aged male in no apparent distress, appears comfortable on exam  NECK: Supple, no lymphadenopathy  LUNGS: unlabored breathing  CV: regular rate, no edema  ABD: Soft, mildly tender, no rebound/guarding, nondistended, normoactive bowel sounds, no appreciable organomegaly  Extremities: no cyanosis, clubbing or edema, normal gait  Psych: affect appropriate, A&O x3  SKIN: no visible lesions on face, neck, arms, abdomen          Labs, Data & Indices:     Lab Review:   Lab Results   Component Value Date    WBC 5.7 06/04/2021    WBC 6.4 08/29/2014    RBC 4.18 (L) 06/04/2021    RBC 4.25 (L) 08/29/2014    HGB 13.9 06/04/2021    HGB 14.2 08/29/2014     Lab Results   Component Value Date    AST 18 06/04/2021    AST 18 (L) 08/29/2014    ALT 19 06/04/2021    ALT 27 08/29/2014    BUN 14 03/13/2020    BUN 19 10/04/2013    Creatinine  Whole Blood, POC 1.1 02/24/2020    Creatinine 0.86 03/13/2020    CO2 26.6 03/13/2020    CO2 25 10/04/2013    Albumin 4.0 03/13/2020    Albumin 4.3 08/29/2014    Calcium 10.1 03/13/2020    Calcium 9.8 10/04/2013     Lab Results   Component Value Date    TSH 1.52 10/04/2013       Anda Kraft Index for Crohn's Disease   General Well Being: 1 = Slightly below par  Abdominal Pain:  0 = none  Number of Liquid Stools Per day:1  Abdominal Mass:  0 = None  Number of Extraintestinal Manifestations:  1   1 point each for: 1. Arthritis/arthralgias, 2. Iritis/uveitis, 3. Active perianal dz, 4. Other active fistula, 5. Erythema nodosum or pyoderma, 6. Other    Total HBI Score (0-25):  3     Score:  Remission < 5;  Mild dz 5-7;  Moderate dz 8-16;  Severe > 16  ............................................................................................................................................  ORDERS THIS VISIT:       Diagnosis ICD-10-CM Associated Orders   1. Crohn's disease of small intestine without complication (CMS-HCC)  K50.00 Colonoscopy      2. Abdominal bloating  R14.0       3. High risk medication use  Z79.899 Assessment & Recommendations:   Disease state:  Active Crohn's flare    Merrit Friesen is a 66 y.o. male with a hx of ileal Crohn's disease dating back to ~2011.  He was previously on humira but stopped due to worsening joint pains and (+)ANA. He was on MTX from ~2015 - 2017, but he self discontinued this medication since he was feeling well.  He developed inflammatory ileal Crohn's flare in fall 2019. Since then, he has had a poor response to Entocort + methotrexate, Stelara 2020-2021 and worsening symptoms on Vedolizumab.  We started Remicade in Feb 2022 and he has had a fairly good symptom response, though still has some loose stools, bloating and blow out episodes.     PLAN:  1.  Lets continue Remicade + methotrexate for now  2.  For night time acid reflux, add Pepcid 20mg  at dinner time  3.  Let's plan for a colonoscopy to see how the Crohn's is doing and to potentially dilate the narrowing at the ilecolonic valve  4.  Follow up 4 months    IBD health maintenance:  Influenza vaccine: 03/12/18, flu vaccine today  Pneumonia vaccine:  Prevnar 08/10/15, Pneumovax ~2012, Pneumovax 03/12/18  COVID19 Vaccine:  2 shots spring 2021, recommend booster  Hepatitis B: sAg neg 03/12/18  TB testing: Quantiferon neg 03/12/18  Chickenpox/Shingles history:  Had chickenpox, Shingrix today 1/2  Bone denistometry:   Derm appointment:  Last colonoscopy:  2019  --------------------------------------------  Author: [name] 07/25/2021 9:44 PM    [name], MD  Clinical Assistant Professor of Medicine  Division of Gastroenterology & Hepatology  [place] [facility]  ============================================

## 2021-07-23 ENCOUNTER — Ambulatory Visit: Admit: 2021-07-23 | Discharge: 2021-07-24 | Payer: MEDICARE

## 2021-07-23 DIAGNOSIS — K5000 Crohn's disease of small intestine without complications: Principal | ICD-10-CM

## 2021-07-23 MED ORDER — FAMOTIDINE 20 MG TABLET
ORAL_TABLET | Freq: Every evening | ORAL | 5 refills | 30.00000 days | Status: CP
Start: 2021-07-23 — End: 2022-07-23

## 2021-07-23 NOTE — Unmapped (Signed)
Raymond Wright it was a pleasure seeing you today.  Here is a summary/wrap up from today's visit:     1.  Lets continue Remicade + methotrexate for now  2.  For night time acid reflux, add Pepcid 20mg  at dinner time  3.  Let's plan for a colonoscopy to see how the Crohn's is doing and to potentially dilate the narrowing at the ilecolonic valve  4.  Follow up 4 months  5.  If any trouble or symptoms, do not hesitate to call.     [name], MD  Assistant Professor of Medicine  Division of Gastroenterology & Hepatology  [facility] [facility]          EXPECTATIONS FOR PATIENT FOLLOW UP AND COMMUNICATION:  -- Follow up Appointments:  Crohn's disease and Ulcerative colitis are serious chronic inflammatory diseases which require close monitoring.  I typically expect to see patients for a follow up visits at least every 6 months (or more often if you are having a flare, starting new therapy, etc).  In select cases, patients who are only on aminosalicylates / mesalamine, may be seen once per year for follow up.       -- Appointment Type: While we are continuing to offer video appointments in select cases (stable patients who are not in a flare and without new/active symptoms) during the COVID19 pandemic, I expect to see patients in person at least once per year.     -- Communication:  if you have any questions or concerns, you can communicate with [place] via phone (contact information below) or via myChart.  Note that phone messages are given higher priority and triaged first.  We typically respond to myChart messages within 3 business days (occasionally longer during holidays or vacation times).  For urgent issues, please contact us via phone.      IBD NURSE COORDINATOR CONTACT - Neta Mends, RN BSN     Phone: [phone] (direct line)      Fax: [phone]  * For urgent medical concerns after hours or on weekends and holidays, call [phone] and ask to speak to the GI Fellow on call.    * If you have a GI medical question or GI symptoms and would like to speak to your provider's healthcare team, please contact Neta Mends, RN (contact information above) OR you can send the GI healthcare team a message through MyChart at [email or web address]    APPOINTMENT SCHEDULING FOR GI CLINIC AND GI PROCEDURES:  RADIOLOGY - to schedule imaging ordered, please call [phone] opt 1   GI MEDICINE CLINIC  [phone] option 1   GI PROCEDURES         [phone] option 2   *To schedule, reschedule, or cancel your GI appointment, please call [phone]. If you are unable to come to an appointment, please notify us as soon as possible, preferably 24 hours in advance. Doing so may allow other patients with urgent needs to be scheduled in a cancelled appointment slot.     TEST RESULTS   If you have a MyChart account, your new results and a provider message will be sent to you through your MyChart account at [email or web address]. For results that require follow-up, a member of your healthcare team will also contact you directly.    PRESCRIPTION REFILL REQUESTS  To request prescription refills, please contact your pharmacy or send your healthcare team a message through your MyChart account at [email or web address]  RECORD REQUESTS  For questions related to  medical records, please call Medical Records Release of Information at [phone]  FINANCIAL COUNSELOR   For billing and other financial questions/needs - please contact Gwendlyn Deutscher [phone]. If you need to leave a message, please be sure to leave your full name, date of birth or MR#, best call back # and reason for call.    For educational material and resources:  [email or web address]  [place] COVID19 Vaccine Information: [email or web address]  ================================================================

## 2021-07-30 ENCOUNTER — Ambulatory Visit: Admit: 2021-07-30 | Discharge: 2021-07-31 | Payer: MEDICARE

## 2021-07-30 LAB — CBC W/ AUTO DIFF
BASOPHILS ABSOLUTE COUNT: 0.1 10*9/L (ref 0.0–0.1)
BASOPHILS RELATIVE PERCENT: 1.2 %
EOSINOPHILS ABSOLUTE COUNT: 1.9 10*9/L — ABNORMAL HIGH (ref 0.0–0.5)
EOSINOPHILS RELATIVE PERCENT: 25.5 %
HEMATOCRIT: 42.1 % (ref 39.0–48.0)
HEMOGLOBIN: 14.4 g/dL (ref 12.9–16.5)
LYMPHOCYTES ABSOLUTE COUNT: 1.6 10*9/L (ref 1.1–3.6)
LYMPHOCYTES RELATIVE PERCENT: 22.5 %
MEAN CORPUSCULAR HEMOGLOBIN CONC: 34.1 g/dL (ref 32.0–36.0)
MEAN CORPUSCULAR HEMOGLOBIN: 33.5 pg — ABNORMAL HIGH (ref 25.9–32.4)
MEAN CORPUSCULAR VOLUME: 98.4 fL — ABNORMAL HIGH (ref 77.6–95.7)
MEAN PLATELET VOLUME: 8 fL (ref 6.8–10.7)
MONOCYTES ABSOLUTE COUNT: 0.5 10*9/L (ref 0.3–0.8)
MONOCYTES RELATIVE PERCENT: 6.8 %
NEUTROPHILS ABSOLUTE COUNT: 3.2 10*9/L (ref 1.8–7.8)
NEUTROPHILS RELATIVE PERCENT: 44 %
NUCLEATED RED BLOOD CELLS: 0 /100{WBCs} (ref <=4)
PLATELET COUNT: 267 10*9/L (ref 150–450)
RED BLOOD CELL COUNT: 4.28 10*12/L (ref 4.26–5.60)
RED CELL DISTRIBUTION WIDTH: 14.2 % (ref 12.2–15.2)
WBC ADJUSTED: 7.3 10*9/L (ref 3.6–11.2)

## 2021-07-30 LAB — ALT: ALT (SGPT): 15 U/L (ref 10–49)

## 2021-07-30 LAB — AST: AST (SGOT): 14 U/L (ref <=34)

## 2021-07-30 LAB — C-REACTIVE PROTEIN: C-REACTIVE PROTEIN: <4 mg/L (ref <=10.0)

## 2021-07-30 LAB — SLIDE REVIEW

## 2021-07-30 MED ADMIN — inFLIXimab-axxq (AVSOLA) 5 mg/kg = 400 mg in sodium chloride (NS) 250 mL IVPB: 5 mg/kg | INTRAVENOUS | @ 16:00:00 | Stop: 2021-07-30

## 2021-07-30 MED ADMIN — cetirizine (ZyrTEC) tablet 10 mg: 10 mg | ORAL | @ 16:00:00 | Stop: 2021-07-30

## 2021-07-30 MED ADMIN — methylPREDNISolone sodium succinate (PF) (Solu-MEDROL) injection 20 mg: 20 mg | INTRAVENOUS | @ 16:00:00 | Stop: 2021-07-30

## 2021-07-30 NOTE — Unmapped (Signed)
Patient presents for standard Avsola (infliximab-axxq) infusion.    In no acute distress. Vitals stable.    Reports no new medical issues or S/S of infection.  IV started. Pt refused Benadryl:  it will make me too sleepy.  Dr Raphael Gibney approved 1 hr infusions from now on.  ??  @  1105 am Avsola (infliximab-axxq) 400 mg started to infuse as follows:   ??  123ml/hr x 15 min    300 ml/hr for the remainder of the infusion.  ??  @ 1212 pm Avsola (infliximab-axxq) infusion complete.  PIV flushed with NS.    Vitals stable.  Patient without any s/s of adverse reaction. IV d/c'd.    Patient discharged from Infusion Center.

## 2021-09-25 ENCOUNTER — Ambulatory Visit: Admit: 2021-09-25 | Discharge: 2021-09-25 | Payer: MEDICARE

## 2021-09-25 DIAGNOSIS — K50914 Crohn's disease, unspecified, with abscess: Principal | ICD-10-CM

## 2021-09-25 DIAGNOSIS — K5000 Crohn's disease of small intestine without complications: Principal | ICD-10-CM

## 2021-09-25 LAB — CBC W/ AUTO DIFF
BASOPHILS ABSOLUTE COUNT: 0.1 10*9/L (ref 0.0–0.1)
BASOPHILS RELATIVE PERCENT: 0.8 %
EOSINOPHILS ABSOLUTE COUNT: 0.5 10*9/L (ref 0.0–0.5)
EOSINOPHILS RELATIVE PERCENT: 4.8 %
HEMATOCRIT: 40.8 % (ref 39.0–48.0)
HEMOGLOBIN: 13.6 g/dL (ref 12.9–16.5)
LYMPHOCYTES ABSOLUTE COUNT: 1.9 10*9/L (ref 1.1–3.6)
LYMPHOCYTES RELATIVE PERCENT: 17 %
MEAN CORPUSCULAR HEMOGLOBIN CONC: 33.4 g/dL (ref 32.0–36.0)
MEAN CORPUSCULAR HEMOGLOBIN: 33.2 pg — ABNORMAL HIGH (ref 25.9–32.4)
MEAN CORPUSCULAR VOLUME: 99.6 fL — ABNORMAL HIGH (ref 77.6–95.7)
MEAN PLATELET VOLUME: 7.2 fL (ref 6.8–10.7)
MONOCYTES ABSOLUTE COUNT: 0.9 10*9/L — ABNORMAL HIGH (ref 0.3–0.8)
MONOCYTES RELATIVE PERCENT: 8.5 %
NEUTROPHILS ABSOLUTE COUNT: 7.7 10*9/L (ref 1.8–7.8)
NEUTROPHILS RELATIVE PERCENT: 68.9 %
PLATELET COUNT: 362 10*9/L (ref 150–450)
RED BLOOD CELL COUNT: 4.1 10*12/L — ABNORMAL LOW (ref 4.26–5.60)
RED CELL DISTRIBUTION WIDTH: 13.5 % (ref 12.2–15.2)
WBC ADJUSTED: 11.1 10*9/L (ref 3.6–11.2)

## 2021-09-25 LAB — C-REACTIVE PROTEIN: C-REACTIVE PROTEIN: 40 mg/L — ABNORMAL HIGH (ref <=10.0)

## 2021-09-25 LAB — AST: AST (SGOT): 16 U/L (ref <=34)

## 2021-09-25 LAB — ALT: ALT (SGPT): 14 U/L (ref 10–49)

## 2021-09-25 MED ORDER — METHOTREXATE SODIUM 2.5 MG TABLET
ORAL_TABLET | ORAL | 0 refills | 84 days | Status: CP
Start: 2021-09-25 — End: 2021-12-24

## 2021-09-25 MED ADMIN — inFLIXimab-axxq (AVSOLA) 5 mg/kg = 400 mg in sodium chloride (NS) 250 mL IVPB: 5 mg/kg | INTRAVENOUS | @ 16:00:00 | Stop: 2021-09-25

## 2021-09-25 NOTE — Unmapped (Signed)
Pt presents for accelerated Remicade.  Pt denies any recent infection, VSS.  IV placed, labs drawn, premeds declined.  Pt aware of potential reaction/side effects, call bell within reach.    1142 Remicade 400mg  started at the following rates:  136ml/hr for 15 min  318ml/hr for rest of infusion.  1246 Infusion complete. Pt tolerated without complication, VSS.  IV flushed per policy and d/c'd, gauze and coban applied.  Pt left clinic in no acute distress.

## 2021-09-25 NOTE — Unmapped (Signed)
Methotrexate refills authorized. Labs up to date. Jain appt up to date.

## 2021-11-11 NOTE — Unmapped (Addendum)
FOLLOW UP CONSULT NOTE - INFLAMMATORY BOWEL DISEASE  11/12/2021    Demographics:  Raymond Wright is a 66 y.o. year old male    Diagnosis:  Crohn's Disease  Disease onset (yr):  2011  Age at onset:  > 18yr old (A3)  Location:  Ileal (L1)  Behavior:  Nonstricturing,nonpenetrating (B1)  Current Tight Control Scenario:   Workup    Referring physician:   Eldridge Abrahams, MD  [address]  [place],  [place] [address]          HPI / NOTE :     Interval Events:   1.  Last visit with me Jan 2023 - still having some symptoms of loose stools and blowouts - we continued IFX + Methotrexate and discussed doing a colonoscopy   2.  I reviewed labs from 09/25/2021 - CRP up to 40, CBC unremarkable, AST/ALT normal.     HPI:  He has continued on his infliximab and MTX. He ha lost 20-25 lbs but he has been trying to eat better. He is having around 1-2 soft bowel movements per day. His bloating but still has nausea out of the blue. His last infliximab level from 10/2020 was 20. He has been called but not set up the colonoscopy. He can eat several pieces of apple and he can feel like ate a large steak dinner. Some occasional vomiting with nausea. Refkex [place] well controlled. His next infusion is next Tuesday.     Importantly, the prior blowout episodes are much less frequent now, which is a significant improvement.     Wt Readings from Last 6 Encounters:   11/12/21 79.9 kg (176 lb 3.2 oz)   09/25/21 82.6 kg (182 lb)   07/30/21 85.3 kg (188 lb)   07/23/21 86.8 kg (191 lb 6.4 oz)   06/04/21 85.6 kg (188 lb 12.8 oz)   03/25/21 86.7 kg (191 lb 3.2 oz)     Abdominal pain (0-10):  RLQ tenderness (constant), perhaps worse  BM a day: 2x/day per day   Consistency:  soft  % of stools have blood: 0%  Nocturnal BM: no  Urgency:  no  Smoking: yes, 4-5 cigarettes per day  NSAIDS: full dose aspirin after stroke     Review of Systems:   Review of systems positive for: negative except as above.   Otherwise, the balance of 10 systems is negative.          IBD HISTORY:     Year of disease onset:  2011    Brief IBD Disease Course:    - 2011 - onset of abdominal pain and diarrhea.  Had MRI locally - told thickening of the bowel and then had a colonoscopy and given dx of ileal Crohn's disease.  Treated with Entocort.   - 2014 - September, hospitalized with severe diarrhea, was treated with Prednisone and then Humira.  Humira stopped in 2015 due to joint symptoms and (+)ANA. Then treated with Entocort + MTX and doing well since then.   - 2016 - continued on MTX 25mg  / week  - 2017 - self-discontinued MTX, no GI follow up  - 2019 - re-established GI care with [place] at [facility].  Restarted methotrexate.    - 2020 - February - started Stelara.  Primary non-response  - 2021 - EGD / Colonoscopy 01/2020 - mild ileal disease.  Good response to Entocort, started Vedolizumab 02/2020.    - 2022 - disease flare on Entyvio, 6+ BM/day, changed to Infliximab + methotrexate  for joint pain    Endoscopy:      - Colonoscopy 10/14/13 - mild ileal Crohn's disease with aphthae, Two 3-2mm polyps in rectosigmoid colon (Path = tubular adenoma), otherwise normal colon.   - Colonoscopy 04/12/18 - poor prep - moderate ileitis for at least 10cm. Otherwise normal colon.    - EGD 02/13/2020 - enlarged gastric folds (bx - benign), otherwise normal esophagus, stomach, duodenum.   - Colonoscopy 02/13/2020 - 4mm descending colon polyp.  10mm ascending colon.  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  Crohn's disease with moderate ileitis for at least 10cm - similar to Jan 2020 colonoscopy. SES-CD = 4.   PATH = ileum - mildly active chronic enteritis.  No granulomas.  Polyps - sessile serrated (ascending), adenoma (descending).     Imaging:    - CT A/P 10/22/13 - normal bowel with no signs of active inflammatino.   - CT-E 03/24/18 - inflammation and thickening of terminal ileum consistent with Crohn's flare. Bilateral renal stones.     Prior IBD medications (type, dose, duration, response):  []  5-ASAs  x Oral corticosteroids - Prednisone, Entocort  []  Intravenous corticosteroids  []  Antibiotics  []  Thiopurines  x Methotrexate - started April 2015.  Re-challenge 2019 - no improvement  x Anti-TNF therapies - Humira 2014 - then developed joint pains and (+)ANA so it was stopped.  Remicade 08/17/2020.   x Anti-Interleukin therapies - Stelara 07/2018 - 01/2020 - primary non-response.   x Anti-Integrin therapies - Vedolizumab 02/2020 - Feb 2022 - primary non-response  []  Cyclosporine  []  Clinical trial medication  []  Other (Please specify):    Extraintestinal manifestations:   -joint pains affecting: n  -eye: n  -skin: n  -oral ulcers :  n  -blood clots: n  -PSC: n  -other: n          Past Medical History:   Past medical history:   Past Medical History:   Diagnosis Date   ??? AC (acromioclavicular) joint bone spurs     in back   ??? Bulging discs    ??? Crohn's disease (CMS-HCC)    ??? Degenerative disc disease    ??? GERD (gastroesophageal reflux disease)    ??? Hypertension    ??? Stroke (CMS-HCC)      Past surgical history:   Past Surgical History:   Procedure Laterality Date   ??? CERVICAL FUSION  2016   ??? KNEE ARTHROSCOPY Right    ??? PR COLONOSCOPY W/BIOPSY SINGLE/MULTIPLE N/A 04/12/2018    Procedure: COLONOSCOPY, FLEXIBLE, PROXIMAL TO SPLENIC FLEXURE; WITH BIOPSY, SINGLE OR MULTIPLE;  Surgeon: [name], MD;  Location: [facility];  Service: Gastroenterology   ??? PR COLONOSCOPY W/BIOPSY SINGLE/MULTIPLE Left 02/13/2020    Procedure: COLONOSCOPY, FLEXIBLE, PROXIMAL TO SPLENIC FLEXURE; WITH BIOPSY, SINGLE OR MULTIPLE;  Surgeon: [name], MD;  Location: [facility];  Service: Gastroenterology   ??? PR COLSC FLX W/RMVL OF TUMOR POLYP LESION SNARE TQ N/A 10/14/2013    Procedure: COLONOSCOPY FLEX; W/REMOV TUMOR/LES BY SNARE;  Surgeon: Theadore Nan, MD;  Location: [facility];  Service: Gastroenterology   ??? PR COLSC FLX W/RMVL OF TUMOR POLYP LESION SNARE TQ Left 02/13/2020    Procedure: COLONOSCOPY FLEX; W/REMOV TUMOR/LES BY SNARE;  Surgeon: [name], MD;  Location: [facility];  Service: Gastroenterology   ??? PR UPPER GI ENDOSCOPY,BIOPSY Left 02/13/2020    Procedure: UGI ENDOSCOPY; WITH BIOPSY, SINGLE OR MULTIPLE;  Surgeon: [name], MD;  Location: [facility];  Service: Gastroenterology     Family history:   Family History   Problem Relation Age of Onset   ??? Cancer Mother         breast   ??? Crohn's disease Neg Hx    ??? Ulcerative colitis Neg Hx    ??? Colorectal Cancer Neg Hx      Social history:   Social History     Socioeconomic History   ??? Marital status: Married     Spouse name: None   ??? Number of children: None   ??? Years of education: None   ??? Highest education level: None   Tobacco Use   ??? Smoking status: Every Day     Packs/day: 0.25     Types: Cigarettes     Start date: 06/2009   ??? Smokeless tobacco: Never   Vaping Use   ??? Vaping Use: Never used   Substance and Sexual Activity   ??? 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 Drug use: No           Allergies:     Allergies   Allergen Reactions   ??? Potassium Clavulanate      Other reaction(s): rash             Medications:     Current Outpatient Medications   Medication Sig Dispense Refill   ??? baclofen (LIORESAL) 10 MG tablet      ??? aspirin 325 MG tablet Take 325 mg by mouth daily.     ??? BACLOFEN ORAL Take 5 mg by mouth nightly.     ??? famotidine (PEPCID) 20 MG tablet Take 1 tablet (20 mg total) by mouth at bedtime. 30 tablet 5   ??? fluticasone propionate (FLONASE) 50 mcg/actuation nasal spray      ??? folic acid (FOLVITE) 1 MG tablet Take 1 tablet (1 mg total) by mouth daily. 30 tablet 11   ??? gabapentin (NEURONTIN) 300 MG capsule Take 300 mg by mouth Three (3) times a day.     ??? HYDROcodone-acetaminophen (NORCO) 7.5-325 mg per tablet Take 3 tablets by mouth daily.      ??? infliximab (REMICADE IV) Remicade     ??? lisinopril (PRINIVIL,ZESTRIL) 20 MG tablet Take 10 mg by mouth every other day. prn      ??? methotrexate 2.5 MG tablet Take 6 tablets (15 mg total) by mouth once a week. Take folic acid 1mg  every day while you are on methotrexate! 72 tablet 0   ??? NEXIUM 40 mg capsule Take 40 mg by mouth every morning before breakfast.      ??? rosuvastatin (CRESTOR) 20 MG tablet Take 20 mg by mouth daily.     ??? tamsulosin (FLOMAX) 0.4 mg capsule        No current facility-administered medications for this visit.           Physical Exam:   BP 143/87  - Pulse 83  - Temp 36.5 ??C (97.7 ??F) (Temporal)  - Ht 175.3 cm (5' 9)  - Wt 79.9 kg (176 lb 3.2 oz)  - BMI 26.02 kg/m??     GEN: middle aged male in no apparent distress, appears comfortable on exam  NECK: Supple, no lymphadenopathy  LUNGS: unlabored breathing  CV: regular rate, no edema  ABD: Soft, tenderness to palpation in the RUQ below costal margin; ne rebound or guarding  Extremities: no  cyanosis, clubbing or edema, normal gait  Psych: affect appropriate, A&O x3  SKIN: no visible lesions on face, neck, arms, abdomen          Labs, Data & Indices:     Lab Review:   Lab Results   Component Value Date    WBC 11.1 09/25/2021    WBC 6.4 08/29/2014    RBC 4.10 (L) 09/25/2021    RBC 4.25 (L) 08/29/2014    HGB 13.6 09/25/2021    HGB 14.2 08/29/2014     Lab Results   Component Value Date    AST 16 09/25/2021    AST 18 (L) 08/29/2014    ALT 14 09/25/2021    ALT 27 08/29/2014    BUN 14 03/13/2020    BUN 19 10/04/2013    Creatinine Whole Blood, POC 1.1 02/24/2020    Creatinine 0.86 03/13/2020    CO2 26.6 03/13/2020    CO2 25 10/04/2013    Albumin 4.0 03/13/2020    Albumin 4.3 08/29/2014    Calcium 10.1 03/13/2020    Calcium 9.8 10/04/2013     Lab Results   Component Value Date    TSH 1.52 10/04/2013       Anda Kraft Index for Crohn's Disease   General Well Being: 1 = Slightly below par  Abdominal Pain:  0 = none  Number of Liquid Stools Per day:1  Abdominal Mass:  0 = None  Number of Extraintestinal Manifestations:  1   1 point each for: 1. Arthritis/arthralgias, 2. Iritis/uveitis, 3. Active perianal dz, 4. Other active fistula, 5. Erythema nodosum or pyoderma, 6. Other    Total HBI Score (0-25):  3     Score:  Remission < 5;  Mild dz 5-7;  Moderate dz 8-16;  Severe > 16  ............................................................................................................................................  ORDERS THIS VISIT:       Diagnosis ICD-10-CM Associated Orders   1. Crohn's disease of small intestine without complication (CMS-HCC)  K50.00 folic acid (FOLVITE) 1 MG tablet     methotrexate 2.5 MG tablet      2. High risk medication use  Z79.899                Assessment & Recommendations:   Disease state:  Active Crohn's flare    Raymond Wright is a 67 y.o. male with a hx of ileal Crohn's disease dating back to ~2011.  He was previously on humira but stopped due to worsening joint pains and (+)ANA. He was on MTX from ~2015 - 2017, but he self discontinued this medication since he was feeling well.  He developed inflammatory ileal Crohn's flare in fall 2019. Since then, he has had a poor response to Entocort + methotrexate, Stelara 2020-2021 and worsening symptoms on Vedolizumab.  We started Remicade(w/ MTX) in Feb 2022 and he has had a fairly good symptom response(2 Bm's daily), though still has intermittent mild  obstructive symptoms and recently has had weight loss(per patient intentional) and elevated CRP. We will re-stage his disease with colonoscopy and consider CTE if his obstructive symptoms return. We also reiterated the importance of tobacco cessation as it relates to his overall health and Crohn's disease.     PLAN:   1.  Continue Remicade + methotrexate for now; infliximab level with next infusion  2.  Colonoscopy to see how the Crohn's is doing and to potentially dilate the narrowing at the ilecolonic valve   *May consider stopping methotrexate depending on colonoscopy / CT-E results  3.  Will consider CT enterography based on results of colonoscopy and symptoms moving forward  4.  Continue Pepcid 20mg  at dinner time  5. Tobacco cessation counseling provided today  6.  Follow up 4 months    IBD health maintenance:  Influenza vaccine: 03/12/18, flu vaccine today  Pneumonia vaccine:  Prevnar 08/10/15, Pneumovax ~2012, Pneumovax 03/12/18  COVID19 Vaccine:  2 shots spring 2021, recommend booster  Hepatitis B: sAg neg 03/12/18  TB testing: Quantiferon neg 03/12/18  Chickenpox/Shingles history:  Had chickenpox, Shingrix today 2/2 today  Bone denistometry:   Derm appointment:  Last colonoscopy:  2021  --------------------------------------------  Author: [name] 11/12/2021 8:55 PM    [name], MD  Associate Professor of Medicine  Division of Gastroenterology & Hepatology  [place] [facility]  ============================================

## 2021-11-12 ENCOUNTER — Ambulatory Visit: Admit: 2021-11-12 | Discharge: 2021-11-13 | Payer: MEDICARE

## 2021-11-12 DIAGNOSIS — K5000 Crohn's disease of small intestine without complications: Principal | ICD-10-CM

## 2021-11-12 DIAGNOSIS — Z79899 Other long term (current) drug therapy: Principal | ICD-10-CM

## 2021-11-12 MED ORDER — FOLIC ACID 1 MG TABLET
ORAL_TABLET | Freq: Every day | ORAL | 11 refills | 30 days | Status: CP
Start: 2021-11-12 — End: 2022-11-12

## 2021-11-12 MED ORDER — METHOTREXATE SODIUM 2.5 MG TABLET
ORAL_TABLET | ORAL | 0 refills | 84.00000 days | Status: CP
Start: 2021-11-12 — End: 2021-11-12

## 2021-11-12 NOTE — Unmapped (Signed)
Confirmed name DOB. Administered 2nd of two SHINGRIX vaccines R Deltoid. Band-Aid applied. Well tolerated. Dressing clean dry intact. Added NIH info to AVS.     Brett Canales RN

## 2021-11-12 NOTE — Unmapped (Addendum)
1.  Lets continue Remicade + methotrexate for now; infliximab level with next infusion  2.  Continue Pepcid 20mg    3.  Please call [phone] to schedule your colonoscopy in the next 1-2 months  4. We will get a CT scan of your belly, you can call the radiology number below if you don't year from them.   5.  Follow up 4 months      [name], MD  Associate Professor of Medicine  Division of Gastroenterology & Hepatology  [place] [facility]          EXPECTATIONS FOR PATIENT FOLLOW UP AND COMMUNICATION:  -- Follow up Appointments:  Crohn's disease and Ulcerative colitis are serious chronic inflammatory diseases which require close monitoring.  I typically expect to see patients for a follow up visits at least every 6 months (or more often if you are having a flare, starting new therapy, etc).  In select cases, patients who are only on aminosalicylates / mesalamine, may be seen once per year for follow up.       -- Appointment Type: While we are continuing to offer video appointments in select cases (stable patients who are not in a flare and without new/active symptoms) during the COVID19 pandemic, I expect to see patients in person at least once per year.     -- Communication:  if you have any questions or concerns, you can communicate with [place] via phone (contact information below) or via myChart.  Note that phone messages are given higher priority and triaged first.  We typically respond to myChart messages within 3 business days (occasionally longer during holidays or vacation times).  For urgent issues, please contact us via phone.      IBD NURSE COORDINATOR CONTACT - Christy Gentles, RN     Phone: [phone] (direct line)      Fax: [phone]  * For urgent medical concerns after hours or on weekends and holidays, call [phone] and ask to speak to the GI Fellow on call.    * If you have a GI medical question or GI symptoms and would like to speak to your provider's healthcare team, please contact Neta Mends, RN (contact information above) OR you can send the GI healthcare team a message through MyChart at [email or web address]    APPOINTMENT SCHEDULING FOR GI CLINIC AND GI PROCEDURES:  RADIOLOGY - to schedule imaging ordered, please call [phone] opt 1   GI MEDICINE CLINIC  [phone] option 1   GI PROCEDURES         [phone] option 2   *To schedule, reschedule, or cancel your GI appointment, please call [phone]. If you are unable to come to an appointment, please notify us as soon as possible, preferably 24 hours in advance. Doing so may allow other patients with urgent needs to be scheduled in a cancelled appointment slot.     TEST RESULTS   If you have a MyChart account, your new results and a provider message will be sent to you through your MyChart account at [email or web address]. For results that require follow-up, a member of your healthcare team will also contact you.    PRESCRIPTION REFILL REQUESTS  To request prescription refills, please contact your pharmacy or send your healthcare team a message through your MyChart account at [email or web address]  RECORD REQUESTS  For questions related to medical records, please call Medical Records Release of Information at [phone]  FINANCIAL COUNSELOR   For billing and other financial questions/needs -  please contact Gwendlyn Deutscher [phone]. If you need to leave a message, please be sure to leave your full name, date of birth or MR#, best call back # and reason for call.    For educational material and resources:  [email or web address]  [place] COVID19 Vaccine Information: [email or web address]  ================================================================

## 2021-11-13 NOTE — Unmapped (Signed)
Addended by: [name] on: 11/12/2021 08:56 PM     Modules accepted: Orders

## 2021-11-15 DIAGNOSIS — K50914 Crohn's disease, unspecified, with abscess: Principal | ICD-10-CM

## 2021-11-19 ENCOUNTER — Ambulatory Visit: Admit: 2021-11-19 | Discharge: 2021-11-20 | Payer: MEDICARE

## 2021-11-19 LAB — CBC W/ AUTO DIFF
BASOPHILS ABSOLUTE COUNT: 0.1 10*9/L (ref 0.0–0.1)
BASOPHILS RELATIVE PERCENT: 1.4 %
EOSINOPHILS ABSOLUTE COUNT: 0.4 10*9/L (ref 0.0–0.5)
EOSINOPHILS RELATIVE PERCENT: 8.7 %
HEMATOCRIT: 38.6 % — ABNORMAL LOW (ref 39.0–48.0)
HEMOGLOBIN: 12.8 g/dL — ABNORMAL LOW (ref 12.9–16.5)
LYMPHOCYTES ABSOLUTE COUNT: 1.6 10*9/L (ref 1.1–3.6)
LYMPHOCYTES RELATIVE PERCENT: 32.1 %
MEAN CORPUSCULAR HEMOGLOBIN CONC: 33.1 g/dL (ref 32.0–36.0)
MEAN CORPUSCULAR HEMOGLOBIN: 32.8 pg — ABNORMAL HIGH (ref 25.9–32.4)
MEAN CORPUSCULAR VOLUME: 99 fL — ABNORMAL HIGH (ref 77.6–95.7)
MEAN PLATELET VOLUME: 7.9 fL (ref 6.8–10.7)
MONOCYTES ABSOLUTE COUNT: 0.3 10*9/L (ref 0.3–0.8)
MONOCYTES RELATIVE PERCENT: 6.9 %
NEUTROPHILS ABSOLUTE COUNT: 2.5 10*9/L (ref 1.8–7.8)
NEUTROPHILS RELATIVE PERCENT: 50.9 %
PLATELET COUNT: 249 10*9/L (ref 150–450)
RED BLOOD CELL COUNT: 3.89 10*12/L — ABNORMAL LOW (ref 4.26–5.60)
RED CELL DISTRIBUTION WIDTH: 14.3 % (ref 12.2–15.2)
WBC ADJUSTED: 4.8 10*9/L (ref 3.6–11.2)

## 2021-11-19 LAB — AST: AST (SGOT): 19 U/L (ref <=34)

## 2021-11-19 LAB — C-REACTIVE PROTEIN: C-REACTIVE PROTEIN: <4 mg/L (ref <=10.0)

## 2021-11-19 LAB — ALT: ALT (SGPT): 18 U/L (ref 10–49)

## 2021-11-19 MED ADMIN — inFLIXimab-axxq (AVSOLA) 5 mg/kg = 400 mg in sodium chloride (NS) 250 mL IVPB: 5 mg/kg | INTRAVENOUS | @ 15:00:00 | Stop: 2021-11-19

## 2021-11-19 NOTE — Unmapped (Signed)
Patient presents for accelerated Avsola (infliximab-axxq) infusion.  In no acute distress.  Vitals stable.  Reports no new medical issues or S/S of infection.  IV started to left arm.  See MAR for premeds.    1109 Avsola (infliximab-axxq) 400 mg started to infuse as follows:     100 ml/hr x 15 min then 300 ml/hr for remainder of infusion.    1210 Avsola (infliximab-axxq) infusion complete.  PIV flushed with NS.  Vitals stable.  Patient without any s/s of adverse reaction. IV d/c'd.  Patient discharged from Infusion Center.

## 2022-01-08 DIAGNOSIS — K50914 Crohn's disease, unspecified, with abscess: Principal | ICD-10-CM

## 2022-01-14 ENCOUNTER — Ambulatory Visit: Admit: 2022-01-14 | Discharge: 2022-01-15 | Payer: MEDICARE

## 2022-01-14 LAB — CBC W/ AUTO DIFF
BASOPHILS ABSOLUTE COUNT: 0.1 10*9/L (ref 0.0–0.1)
BASOPHILS RELATIVE PERCENT: 2.1 %
EOSINOPHILS ABSOLUTE COUNT: 0.4 10*9/L (ref 0.0–0.5)
EOSINOPHILS RELATIVE PERCENT: 9.9 %
HEMATOCRIT: 39.8 % (ref 39.0–48.0)
HEMOGLOBIN: 13.6 g/dL (ref 12.9–16.5)
LYMPHOCYTES ABSOLUTE COUNT: 1.1 10*9/L (ref 1.1–3.6)
LYMPHOCYTES RELATIVE PERCENT: 26.9 %
MEAN CORPUSCULAR HEMOGLOBIN CONC: 34.3 g/dL (ref 32.0–36.0)
MEAN CORPUSCULAR HEMOGLOBIN: 33.6 pg — ABNORMAL HIGH (ref 25.9–32.4)
MEAN CORPUSCULAR VOLUME: 98 fL — ABNORMAL HIGH (ref 77.6–95.7)
MEAN PLATELET VOLUME: 7.6 fL (ref 6.8–10.7)
MONOCYTES ABSOLUTE COUNT: 0.4 10*9/L (ref 0.3–0.8)
MONOCYTES RELATIVE PERCENT: 9.2 %
NEUTROPHILS ABSOLUTE COUNT: 2.1 10*9/L (ref 1.8–7.8)
NEUTROPHILS RELATIVE PERCENT: 51.9 %
NUCLEATED RED BLOOD CELLS: 0 /100{WBCs} (ref <=4)
PLATELET COUNT: 237 10*9/L (ref 150–450)
RED BLOOD CELL COUNT: 4.06 10*12/L — ABNORMAL LOW (ref 4.26–5.60)
RED CELL DISTRIBUTION WIDTH: 14.4 % (ref 12.2–15.2)
WBC ADJUSTED: 4.1 10*9/L (ref 3.6–11.2)

## 2022-01-14 LAB — C-REACTIVE PROTEIN: C-REACTIVE PROTEIN: <4 mg/L (ref <=10.0)

## 2022-01-14 LAB — ALT: ALT (SGPT): 19 U/L (ref 10–49)

## 2022-01-14 LAB — AST: AST (SGOT): 17 U/L (ref <=34)

## 2022-01-14 MED ADMIN — inFLIXimab-axxq (AVSOLA) 5 mg/kg = 400 mg in sodium chloride (NS) 250 mL IVPB: 5 mg/kg | INTRAVENOUS | @ 14:00:00 | Stop: 2022-01-14

## 2022-01-14 NOTE — Unmapped (Signed)
Pt presents for accelerated Remicade.  Pt denies any recent infection, VSS.  IV placed, labs drawn, premeds declined.  Pt aware of potential reaction/side effects, call bell within reach.    5784 Remicade 400mg  started at the following rates:  183ml/hr for 15 min  376ml/hr for rest of infusion.  1034 Infusion complete. Pt tolerated without complication, VSS.  IV flushed per policy and d/c'd, gauze and coban applied.  Pt left clinic in no acute distress.

## 2022-03-06 DIAGNOSIS — K50914 Crohn's disease, unspecified, with abscess: Principal | ICD-10-CM

## 2022-03-11 ENCOUNTER — Ambulatory Visit: Admit: 2022-03-11 | Discharge: 2022-03-12 | Payer: MEDICARE

## 2022-03-11 LAB — CBC W/ AUTO DIFF
BASOPHILS ABSOLUTE COUNT: 0.1 10*9/L (ref 0.0–0.1)
BASOPHILS RELATIVE PERCENT: 1.3 %
EOSINOPHILS ABSOLUTE COUNT: 0.5 10*9/L (ref 0.0–0.5)
EOSINOPHILS RELATIVE PERCENT: 9 %
HEMATOCRIT: 40.2 % (ref 39.0–48.0)
HEMOGLOBIN: 13.7 g/dL (ref 12.9–16.5)
LYMPHOCYTES ABSOLUTE COUNT: 1.5 10*9/L (ref 1.1–3.6)
LYMPHOCYTES RELATIVE PERCENT: 28.3 %
MEAN CORPUSCULAR HEMOGLOBIN CONC: 34.2 g/dL (ref 32.0–36.0)
MEAN CORPUSCULAR HEMOGLOBIN: 34 pg — ABNORMAL HIGH (ref 25.9–32.4)
MEAN CORPUSCULAR VOLUME: 99.4 fL — ABNORMAL HIGH (ref 77.6–95.7)
MEAN PLATELET VOLUME: 7.7 fL (ref 6.8–10.7)
MONOCYTES ABSOLUTE COUNT: 0.5 10*9/L (ref 0.3–0.8)
MONOCYTES RELATIVE PERCENT: 9 %
NEUTROPHILS ABSOLUTE COUNT: 2.8 10*9/L (ref 1.8–7.8)
NEUTROPHILS RELATIVE PERCENT: 52.4 %
PLATELET COUNT: 233 10*9/L (ref 150–450)
RED BLOOD CELL COUNT: 4.04 10*12/L — ABNORMAL LOW (ref 4.26–5.60)
RED CELL DISTRIBUTION WIDTH: 14 % (ref 12.2–15.2)
WBC ADJUSTED: 5.3 10*9/L (ref 3.6–11.2)

## 2022-03-11 LAB — ALT: ALT (SGPT): 23 U/L (ref 10–49)

## 2022-03-11 LAB — C-REACTIVE PROTEIN: C-REACTIVE PROTEIN: <4 mg/L (ref <=10.0)

## 2022-03-11 MED ADMIN — inFLIXimab-axxq (AVSOLA) 5 mg/kg = 400 mg in sodium chloride (NS) 250 mL IVPB: 5 mg/kg | INTRAVENOUS | @ 16:00:00 | Stop: 2022-03-11

## 2022-03-11 NOTE — Unmapped (Signed)
Patient presents for accelerated Avsola (infliximab-axxq) infusion.  In no acute distress.  Vitals stable.  Reports no new medical issues or S/S of infection.  IV started.  See MAR for premeds.    1139 Avsola (infliximab-axxq) 400 mg started to infuse as follows:     100 ml/hr x 15 min then 300 ml/hr for remainder of infusion.    1240 Avsola (infliximab-axxq) infusion complete.  PIV flushed with NS.  Vitals stable.  Patient without any s/s of adverse reaction.    IV d/c'd.  Patient discharged from Infusion Center.

## 2022-05-01 DIAGNOSIS — K50914 Crohn's disease, unspecified, with abscess: Principal | ICD-10-CM

## 2022-05-02 MED ORDER — METHOTREXATE SODIUM 2.5 MG TABLET
ORAL_TABLET | ORAL | 0 refills | 84 days | Status: CP
Start: 2022-05-02 — End: ?

## 2022-05-02 NOTE — Unmapped (Signed)
Refill request for methotrexate tablets sent to pharmacy for 3 month supply. Pt up to date on appts. Labs DOS 03/11/22 reviewed by Dr. Raphael Gibney. Pt gets labs with infusions.

## 2022-05-06 ENCOUNTER — Ambulatory Visit: Admit: 2022-05-06 | Discharge: 2022-05-07 | Payer: MEDICARE

## 2022-05-06 LAB — CBC W/ AUTO DIFF
BASOPHILS ABSOLUTE COUNT: 0 10*9/L (ref 0.0–0.1)
BASOPHILS RELATIVE PERCENT: 0.3 %
EOSINOPHILS ABSOLUTE COUNT: 0.5 10*9/L (ref 0.0–0.5)
EOSINOPHILS RELATIVE PERCENT: 7.4 %
HEMATOCRIT: 39.3 % (ref 39.0–48.0)
HEMOGLOBIN: 13.7 g/dL (ref 12.9–16.5)
LYMPHOCYTES ABSOLUTE COUNT: 2 10*9/L (ref 1.1–3.6)
LYMPHOCYTES RELATIVE PERCENT: 32.4 %
MEAN CORPUSCULAR HEMOGLOBIN CONC: 34.9 g/dL (ref 32.0–36.0)
MEAN CORPUSCULAR HEMOGLOBIN: 34 pg — ABNORMAL HIGH (ref 25.9–32.4)
MEAN CORPUSCULAR VOLUME: 97.4 fL — ABNORMAL HIGH (ref 77.6–95.7)
MEAN PLATELET VOLUME: 7.6 fL (ref 6.8–10.7)
MONOCYTES ABSOLUTE COUNT: 0.3 10*9/L (ref 0.3–0.8)
MONOCYTES RELATIVE PERCENT: 4.6 %
NEUTROPHILS ABSOLUTE COUNT: 3.4 10*9/L (ref 1.8–7.8)
NEUTROPHILS RELATIVE PERCENT: 55.3 %
NUCLEATED RED BLOOD CELLS: 0 /100{WBCs} (ref <=4)
PLATELET COUNT: 317 10*9/L (ref 150–450)
RED BLOOD CELL COUNT: 4.03 10*12/L — ABNORMAL LOW (ref 4.26–5.60)
RED CELL DISTRIBUTION WIDTH: 13.1 % (ref 12.2–15.2)
WBC ADJUSTED: 6.1 10*9/L (ref 3.6–11.2)

## 2022-05-06 LAB — AST: AST (SGOT): 17 U/L (ref <=34)

## 2022-05-06 LAB — ALT: ALT (SGPT): 12 U/L (ref 10–49)

## 2022-05-06 LAB — C-REACTIVE PROTEIN: C-REACTIVE PROTEIN: 4 mg/L (ref <=10.0)

## 2022-05-06 MED ADMIN — inFLIXimab-axxq (AVSOLA) 5 mg/kg = 400 mg in sodium chloride (NS) 250 mL IVPB: 5 mg/kg | INTRAVENOUS | @ 16:00:00 | Stop: 2022-05-06

## 2022-05-06 NOTE — Unmapped (Signed)
Patient presents for accelerated Avsola (infliximab-axxq) infusion.  In no acute distress.  Vitals stable.  Reports no new medical issues or S/S of infection.  IV started to left arm.  See MAR for premeds.    1048 Avsola (infliximab-axxq) 400 mg started to infuse as follows:     100 ml/hr x 15 min then 300 ml/hr for remainder of infusion.    1148 Avsola (infliximab-axxq) infusion complete.  PIV flushed with NS.  Vitals stable.  Patient without any s/s of adverse reaction. IV d/c'd.  Patient discharged from Infusion Center.

## 2022-06-25 DIAGNOSIS — K50914 Crohn's disease, unspecified, with abscess: Principal | ICD-10-CM

## 2022-07-02 ENCOUNTER — Ambulatory Visit: Admit: 2022-07-02 | Discharge: 2022-07-03 | Payer: MEDICARE

## 2022-07-02 LAB — CBC W/ AUTO DIFF
BASOPHILS ABSOLUTE COUNT: 0.1 10*9/L (ref 0.0–0.1)
BASOPHILS RELATIVE PERCENT: 0.8 %
EOSINOPHILS ABSOLUTE COUNT: 0.5 10*9/L (ref 0.0–0.5)
EOSINOPHILS RELATIVE PERCENT: 6.9 %
HEMATOCRIT: 39.5 % (ref 39.0–48.0)
HEMOGLOBIN: 13.4 g/dL (ref 12.9–16.5)
LYMPHOCYTES ABSOLUTE COUNT: 1.9 10*9/L (ref 1.1–3.6)
LYMPHOCYTES RELATIVE PERCENT: 24.1 %
MEAN CORPUSCULAR HEMOGLOBIN CONC: 34 g/dL (ref 32.0–36.0)
MEAN CORPUSCULAR HEMOGLOBIN: 33.5 pg — ABNORMAL HIGH (ref 25.9–32.4)
MEAN CORPUSCULAR VOLUME: 98.5 fL — ABNORMAL HIGH (ref 77.6–95.7)
MEAN PLATELET VOLUME: 7.7 fL (ref 6.8–10.7)
MONOCYTES ABSOLUTE COUNT: 0.3 10*9/L (ref 0.3–0.8)
MONOCYTES RELATIVE PERCENT: 3.8 %
NEUTROPHILS ABSOLUTE COUNT: 5 10*9/L (ref 1.8–7.8)
NEUTROPHILS RELATIVE PERCENT: 64.4 %
NUCLEATED RED BLOOD CELLS: 0 /100{WBCs} (ref <=4)
PLATELET COUNT: 325 10*9/L (ref 150–450)
RED BLOOD CELL COUNT: 4.01 10*12/L — ABNORMAL LOW (ref 4.26–5.60)
RED CELL DISTRIBUTION WIDTH: 14.2 % (ref 12.2–15.2)
WBC ADJUSTED: 7.7 10*9/L (ref 3.6–11.2)

## 2022-07-02 LAB — C-REACTIVE PROTEIN: C-REACTIVE PROTEIN: <4 mg/L (ref <=10.0)

## 2022-07-02 LAB — ALT: ALT (SGPT): 26 U/L (ref 10–49)

## 2022-07-02 MED ADMIN — inFLIXimab-axxq (AVSOLA) 5 mg/kg = 400 mg in sodium chloride (NS) 250 mL IVPB: 5 mg/kg | INTRAVENOUS | @ 16:00:00 | Stop: 2022-07-02

## 2022-07-02 NOTE — Unmapped (Signed)
Patient presents for accelerated Avsola (infliximab-axxq) infusion.  In no acute distress.  Vitals stable.  Reports no new medical issues or S/S of infection. PIV placed in left forearm and secured with tegaderm, labs drawn. Patient refused premeds and is aware of potential reactions/side effects, call bell within reach.      1100 Avsola (infliximab-axxq) 400 mg started to infuse as follows:     100 ml/hr x 15 min then 300 ml/hr for remainder of infusion.    1157 Avsola (infliximab-axxq) infusion complete.      Infusion tolerated well, no s/s of adverse reaction, VSS. PIV flushed per protocol and d/c'd, no bleeding, bruising or swelling noted, gauze and coban applied. Patient discharged from Infusion Center in stable condition.

## 2022-08-23 DIAGNOSIS — K50914 Crohn's disease, unspecified, with abscess: Principal | ICD-10-CM

## 2022-08-27 ENCOUNTER — Ambulatory Visit: Admit: 2022-08-27 | Discharge: 2022-08-28 | Payer: MEDICARE

## 2022-08-27 LAB — CBC W/ AUTO DIFF
BASOPHILS ABSOLUTE COUNT: 0.1 10*9/L (ref 0.0–0.1)
BASOPHILS RELATIVE PERCENT: 1.1 %
EOSINOPHILS ABSOLUTE COUNT: 0.8 10*9/L — ABNORMAL HIGH (ref 0.0–0.5)
EOSINOPHILS RELATIVE PERCENT: 11.1 %
HEMATOCRIT: 42.3 % (ref 39.0–48.0)
HEMOGLOBIN: 14.4 g/dL (ref 12.9–16.5)
LYMPHOCYTES ABSOLUTE COUNT: 1.8 10*9/L (ref 1.1–3.6)
LYMPHOCYTES RELATIVE PERCENT: 25.9 %
MEAN CORPUSCULAR HEMOGLOBIN CONC: 34 g/dL (ref 32.0–36.0)
MEAN CORPUSCULAR HEMOGLOBIN: 33.5 pg — ABNORMAL HIGH (ref 25.9–32.4)
MEAN CORPUSCULAR VOLUME: 98.5 fL — ABNORMAL HIGH (ref 77.6–95.7)
MEAN PLATELET VOLUME: 7.4 fL (ref 6.8–10.7)
MONOCYTES ABSOLUTE COUNT: 0.5 10*9/L (ref 0.3–0.8)
MONOCYTES RELATIVE PERCENT: 7.3 %
NEUTROPHILS ABSOLUTE COUNT: 3.8 10*9/L (ref 1.8–7.8)
NEUTROPHILS RELATIVE PERCENT: 54.6 %
NUCLEATED RED BLOOD CELLS: 0 /100{WBCs} (ref <=4)
PLATELET COUNT: 272 10*9/L (ref 150–450)
RED BLOOD CELL COUNT: 4.29 10*12/L (ref 4.26–5.60)
RED CELL DISTRIBUTION WIDTH: 14 % (ref 12.2–15.2)
WBC ADJUSTED: 6.9 10*9/L (ref 3.6–11.2)

## 2022-08-27 LAB — ALT: ALT (SGPT): 30 U/L (ref 10–49)

## 2022-08-27 LAB — AST: AST (SGOT): 31 U/L (ref <=34)

## 2022-08-27 LAB — C-REACTIVE PROTEIN: C-REACTIVE PROTEIN: <4 mg/L (ref <=10.0)

## 2022-08-27 MED ADMIN — inFLIXimab-axxq (AVSOLA) 5 mg/kg = 400 mg in sodium chloride (NS) 250 mL IVPB: 5 mg/kg | INTRAVENOUS | @ 16:00:00 | Stop: 2022-08-27

## 2022-08-27 NOTE — Unmapped (Signed)
Patient presents for accelerated Avsola (infliximab-axxq) infusion in no acute distress. VSS. Patient reports no new medical issues or S/S of infection. IV placed in left arm, labs drawn.     1048 Avsola (infliximab-axxq) 400 mg started to infuse as follows:     100 ml/hr x 15 min then 300 ml/hr for remainder of infusion.    1148 Avsola (infliximab-axxq) infusion complete    Patient tolerated infusion well. IV flushed per protocol. VSS.     IV dc'd, gauze and coban applied. Patient discharged from infusion center in no acute distress.

## 2022-10-20 DIAGNOSIS — K50914 Crohn's disease, unspecified, with abscess: Principal | ICD-10-CM

## 2022-10-22 ENCOUNTER — Ambulatory Visit: Admit: 2022-10-22 | Discharge: 2022-10-23 | Payer: MEDICARE

## 2022-10-22 LAB — CBC W/ AUTO DIFF
BASOPHILS ABSOLUTE COUNT: 0.1 10*9/L (ref 0.0–0.1)
BASOPHILS RELATIVE PERCENT: 1.3 %
EOSINOPHILS ABSOLUTE COUNT: 0.6 10*9/L — ABNORMAL HIGH (ref 0.0–0.5)
EOSINOPHILS RELATIVE PERCENT: 10.8 %
HEMATOCRIT: 39.2 % (ref 39.0–48.0)
HEMOGLOBIN: 13.8 g/dL (ref 12.9–16.5)
LYMPHOCYTES ABSOLUTE COUNT: 1.8 10*9/L (ref 1.1–3.6)
LYMPHOCYTES RELATIVE PERCENT: 31.6 %
MEAN CORPUSCULAR HEMOGLOBIN CONC: 35.3 g/dL (ref 32.0–36.0)
MEAN CORPUSCULAR HEMOGLOBIN: 33.9 pg — ABNORMAL HIGH (ref 25.9–32.4)
MEAN CORPUSCULAR VOLUME: 96.2 fL — ABNORMAL HIGH (ref 77.6–95.7)
MEAN PLATELET VOLUME: 7.6 fL (ref 6.8–10.7)
MONOCYTES ABSOLUTE COUNT: 0.4 10*9/L (ref 0.3–0.8)
MONOCYTES RELATIVE PERCENT: 7.7 %
NEUTROPHILS ABSOLUTE COUNT: 2.8 10*9/L (ref 1.8–7.8)
NEUTROPHILS RELATIVE PERCENT: 48.6 %
PLATELET COUNT: 244 10*9/L (ref 150–450)
RED BLOOD CELL COUNT: 4.08 10*12/L — ABNORMAL LOW (ref 4.26–5.60)
RED CELL DISTRIBUTION WIDTH: 12.9 % (ref 12.2–15.2)
WBC ADJUSTED: 5.7 10*9/L (ref 3.6–11.2)

## 2022-10-22 LAB — AST: AST (SGOT): 30 U/L (ref <=34)

## 2022-10-22 LAB — ALT: ALT (SGPT): 28 U/L (ref 10–49)

## 2022-10-22 LAB — C-REACTIVE PROTEIN: C-REACTIVE PROTEIN: <4 mg/L (ref <=10.0)

## 2022-10-22 MED ADMIN — inFLIXimab-axxq (AVSOLA) 5 mg/kg = 400 mg in sodium chloride (NS) 250 mL IVPB: 5 mg/kg | INTRAVENOUS | @ 15:00:00 | Stop: 2022-10-22

## 2022-10-22 NOTE — Unmapped (Signed)
Patient presents for accelerated Avsola (infliximab-axxq) infusion.  In no acute distress.  Vitals stable.  Reports no new medical issues or S/S of infection.  IV started in right hand.  See MAR for premeds.    1106 Avsola (infliximab-axxq) 400 mg started to infuse as follows:     100 ml/hr x 15 min then 300 ml/hr for remainder of infusion.    1203 Avsola (infliximab-axxq) infusion complete.  PIV flushed with NS.  Vitals stable.  Patient without any s/s of adverse reaction.    IV d/c'd.  Patient discharged from Infusion Center.

## 2022-11-26 NOTE — Unmapped (Signed)
Reason For Call:  Received mychart message from pt that he is having a Crohn's flare.     Assessment:   Pt states for about two weeks his thickened place has been hurting. Pt has history of strictures/obstructions. Pt states he is having about 6 loose BM's daily. Wondering if Dr. Raphael Gibney can prescribe him budesonide.    Instructions Provided:  Discussed with Dr. Raphael Gibney and if possible will have pt hold off on budesonide prior to getting a CTE Abdomen/pelvis w/ contrast and colonoscopy performed. Tests ordered.     Follow Up:  Messaged pt with update.    Workup Time:   Time spent 15 mins

## 2022-11-27 NOTE — Unmapped (Signed)
Colonoscopy  Procedure #1     Procedure #2   [id number]  MRN   IBD  Endoscopist     Is the patient's health insurance [address], [place] [facility]), or [employer]?     Urgent procedure     Are you pregnant?     Are you in the process of scheduling or awaiting results of a heart ultrasound, stress test, or catheterization to evaluate new or worsening chest pain, dizziness, or shortness of breath?     Do you take: Plavix (clopidogrel), Coumadin (warfarin), Lovenox (enoxaparin), Pradaxa (dabigatran), Effient (prasugrel), Xarelto (rivaroxaban), Eliquis (apixaban), Pletal (cilostazol), or Brilinta (ticagrelor)?          Which of the above medications are you taking?          What is the name of the medical practice that manages this medication?          What is the name of the medical provider who manages this medication?     Do you have hemophilia, von Willebrand disease, or low platelets?     Do you have a pacemaker or implanted cardiac defibrillator?     Has a [place] GI provider specified the location(s)?     Which location(s) did the [facility] provider specify?        [facility]        [place]        [facility]        [facility]     Is procedure indication for variceal banding (this does NOT include variceal screening)?     Have you had a heart attack, stroke or heart stent placement within the past 6 months?     Month of event     Year of event (ONLY ENTER LAST 2 DIGITS)        5  Height (feet)   9  Height (inches)   173  Weight (pounds)   25.5  BMI          Did the ordering provider specify a bowel prep?          What bowel prep was specified?     Do you have chronic kidney disease?     Do you have chronic constipation or have you had poor quality bowel preps for past colonoscopies?   TRUE  Do you have Crohn's disease or ulcerative colitis?     Have you had weight loss surgery?          When you walk around your house or grocery store, do you have to stop and rest due to shortness of breath, chest pain, or light-headedness?     Do you ever use supplemental oxygen?     Have you been hospitalized for cirrhosis of the liver or heart failure in the last 12 months?     Have you been treated for mouth or throat cancer with radiation or surgery?     Have you been told that it is difficult for doctors to insert a breathing tube in you during anesthesia?     Have you had a heart or lung transplant?          Are you on dialysis?     Do you have cirrhosis of the liver?     Do you have myasthenia gravis?     Is the patient a prisoner?          Have you been diagnosed with sleep apnea or do you wear a  CPAP machine at night?     Are you younger than 30?   TRUE  Have you previously received propofol sedation administered by an anesthesiologist for a GI procedure?     Do you drink an average of more than 3 drinks of alcohol per day?     Do you regularly take suboxone or any prescription medications for chronic pain?     Do you regularly take Ativan, Klonopin, Xanax, Valium, lorazepam, clonazepam, alprazolam, or diazepam?     Have you previously had difficulty with sedation during a GI procedure?     Have you been diagnosed with PTSD?     Are you allergic to fentanyl or midazolam (Versed)?     Do you take medications for HIV?   ################# ## ###################################################################################################################   MRN:          [id number]   Anticoag Review:  No   Nurse Triage:  No   GI Clinic Consult:  No   Procedure(s):  Colonoscopy     0   [facility]     [place]        Endoscopist:  IBD   Urgent:            No   Prep:               Miralax Prep                  ################# ## ###################################################################################################################

## 2022-12-17 ENCOUNTER — Ambulatory Visit: Admit: 2022-12-17 | Discharge: 2022-12-18 | Payer: MEDICARE

## 2022-12-17 LAB — CBC W/ AUTO DIFF
BASOPHILS ABSOLUTE COUNT: 0 10*9/L (ref 0.0–0.1)
BASOPHILS RELATIVE PERCENT: 0.2 %
EOSINOPHILS ABSOLUTE COUNT: 0.6 10*9/L — ABNORMAL HIGH (ref 0.0–0.5)
EOSINOPHILS RELATIVE PERCENT: 10.2 %
HEMATOCRIT: 41.2 % (ref 39.0–48.0)
HEMOGLOBIN: 14.2 g/dL (ref 12.9–16.5)
LYMPHOCYTES ABSOLUTE COUNT: 1.7 10*9/L (ref 1.1–3.6)
LYMPHOCYTES RELATIVE PERCENT: 27.2 %
MEAN CORPUSCULAR HEMOGLOBIN CONC: 34.6 g/dL (ref 32.0–36.0)
MEAN CORPUSCULAR HEMOGLOBIN: 33 pg — ABNORMAL HIGH (ref 25.9–32.4)
MEAN CORPUSCULAR VOLUME: 95.4 fL (ref 77.6–95.7)
MEAN PLATELET VOLUME: 7.5 fL (ref 6.8–10.7)
MONOCYTES ABSOLUTE COUNT: 0.5 10*9/L (ref 0.3–0.8)
MONOCYTES RELATIVE PERCENT: 7.2 %
NEUTROPHILS ABSOLUTE COUNT: 3.5 10*9/L (ref 1.8–7.8)
NEUTROPHILS RELATIVE PERCENT: 55.2 %
PLATELET COUNT: 250 10*9/L (ref 150–450)
RED BLOOD CELL COUNT: 4.32 10*12/L (ref 4.26–5.60)
RED CELL DISTRIBUTION WIDTH: 13.2 % (ref 12.2–15.2)
WBC ADJUSTED: 6.3 10*9/L (ref 3.6–11.2)

## 2022-12-17 LAB — AST: AST (SGOT): 21 U/L (ref <=34)

## 2022-12-17 LAB — C-REACTIVE PROTEIN: C-REACTIVE PROTEIN: <4 mg/L (ref <=10.0)

## 2022-12-17 LAB — ALT: ALT (SGPT): 20 U/L (ref 10–49)

## 2022-12-17 MED ADMIN — inFLIXimab-axxq (AVSOLA) 5 mg/kg = 400 mg in sodium chloride (NS) 250 mL IVPB: 5 mg/kg | INTRAVENOUS | @ 15:00:00 | Stop: 2022-12-17

## 2022-12-17 NOTE — Unmapped (Signed)
Patient presents for accelerated Avsola (infliximab-axxq) infusion.  In no acute distress.  Vitals stable.  Reports no new medical issues or S/S of infection.  IV started.  See MAR for premeds.    1034  Avsola (infliximab-axxq) 400 mg started to infuse as follows:     100 ml/hr x 15 min then 300 ml/hr for remainder of infusion.    1134 Avsola (infliximab-axxq) infusion complete.  PIV flushed with NS.  Vitals stable.  Patient without any s/s of adverse reaction. IV d/c'd.  Patient discharged from Infusion Center.

## 2022-12-22 ENCOUNTER — Ambulatory Visit: Admit: 2022-12-22 | Discharge: 2022-12-22 | Payer: MEDICARE

## 2022-12-22 MED ADMIN — iohexol (OMNIPAQUE) 350 mg iodine/mL solution 100 mL: 100 mL | INTRAVENOUS | @ 18:00:00 | Stop: 2022-12-22

## 2022-12-23 NOTE — Unmapped (Signed)
Left message on 12/23/22 for Mr. Rescue regarding upcoming GI procedure on 12/24/22 at [facility] location at 0900 with arrival time of 0800. Detailed start date of low fiber diet and clear liquid diet/prep, medications to hold or adjust as well as needing to have driver. Reminded patient that prep instructions are in Mychart message dated 11/27/22. Reinforced that failure to follow the diet and prep instructions could result in procedure getting rescheduled. Left number [phone] for return call if there are any questions after reviewing instructions.

## 2022-12-24 ENCOUNTER — Ambulatory Visit: Admit: 2022-12-24 | Discharge: 2022-12-24 | Payer: MEDICARE

## 2022-12-24 ENCOUNTER — Encounter: Admit: 2022-12-24 | Discharge: 2022-12-24 | Payer: MEDICARE | Attending: Anesthesiology | Primary: Anesthesiology

## 2022-12-24 MED ADMIN — lactated Ringers infusion: 10 mL/h | INTRAVENOUS | @ 13:00:00 | Stop: 2022-12-24

## 2022-12-24 MED ADMIN — lidocaine (PF) (XYLOCAINE-MPF) 20 mg/mL (2 %) injection: INTRAVENOUS | @ 13:00:00 | Stop: 2022-12-24

## 2022-12-24 MED ADMIN — Propofol (DIPRIVAN) injection: INTRAVENOUS | @ 13:00:00 | Stop: 2022-12-24

## 2022-12-24 MED ADMIN — dexmedeTOMIDine (Precedex) injection: INTRAVENOUS | @ 13:00:00 | Stop: 2022-12-24

## 2022-12-24 MED ADMIN — propofol (DIPRIVAN) infusion 10 mg/mL: INTRAVENOUS | @ 13:00:00 | Stop: 2022-12-24

## 2023-01-27 ENCOUNTER — Ambulatory Visit: Admit: 2023-01-27 | Discharge: 2023-01-28 | Payer: MEDICARE

## 2023-01-27 DIAGNOSIS — R197 Diarrhea, unspecified: Principal | ICD-10-CM

## 2023-01-27 DIAGNOSIS — K50018 Crohn's disease of small intestine with other complication: Principal | ICD-10-CM

## 2023-01-27 DIAGNOSIS — K5000 Crohn's disease of small intestine without complications: Principal | ICD-10-CM

## 2023-01-27 DIAGNOSIS — Z79899 Other long term (current) drug therapy: Principal | ICD-10-CM

## 2023-01-27 MED ORDER — CHOLESTYRAMINE (WITH SUGAR) 4 GRAM POWDER FOR SUSP IN A PACKET
Freq: Two times a day (BID) | ORAL | 2 refills | 30 days | Status: CP
Start: 2023-01-27 — End: 2023-04-27

## 2023-01-27 NOTE — Unmapped (Signed)
FOLLOW UP CONSULT NOTE - INFLAMMATORY BOWEL DISEASE  01/27/2023    Demographics:  Raymond Wright is a 67 y.o. year old male    Diagnosis:  Crohn's Disease  Disease onset (yr):  2011  Age at onset:  > 44yr old (A3)  Location:  Ileal (L1)  Behavior:  Nonstricturing,nonpenetrating (B1)  Current Tight Control Scenario:   Maintenance = Biologic    Referring physician:   Pcp, None Per Patient  [address]  [place],  [place] [address]          HPI / NOTE :     Interval Events:   1.  Last visit with me May 2023.  Stayed on Remicade + Methotrexate.   2.  Called in May 2024 with concerns for a Crohn's flare.    3.  We did CT-E + EGD + Colonoscopy (summarized below).  Notable for enlarged gastric folds and mild pyloric stenosis with ulceration.     HPI:  Patient continues to feel generally poorly.  He was feeling well until May 2024 when he hadhad acute worsening of his diarrhea which has been evaluated endoscopically with CT scan imaging as well which was overall reassuring.  He continues to have 8-10 bowel movements per day about 3 to 4 days/week which are loose and diarrhea.  He denies having any significant constipation but will not have a bowel movement for a day or so in between these severe episodes of diarrhea.  He denies any known history of prior infections during the beginning of his symptoms.  He does have a 30 pound weight loss which she reports has been intentional.  He does report having improvement in his diet with eating a lot more leafy green vegetables.  He does have a baseline level of chronic nausea but no significant amount of vomiting.  His wife is present with him and both are expressing frustration with his persistent symptoms despite infliximab for likely controlling the inflammatory component of his Crohn's disease.  He has no longer taking his methotrexate for about 1 year.  He has been taking his infliximab as prescribed without out significant side effects to the medication or worsening symptoms just prior to the infusion.    Abdominal pain (0-10):  RLQ tenderness (constant), perhaps worse  BM a day: 8-10 times per day with diarrhea  Consistency: loose stool, diarrhea  % of stools have blood: 0%  Nocturnal BM: no  Urgency:  yes  Smoking: yes, 4-5 cigarettes per day  NSAIDS: full dose aspirin after stroke     Review of Systems:   Review of systems positive for: negative except as above.   Otherwise, the balance of 10 systems is negative.          IBD HISTORY:     Year of disease onset:  2011    Brief IBD Disease Course:    - 2011 - onset of abdominal pain and diarrhea.  Had MRI locally - told thickening of the bowel and then had a colonoscopy and given dx of ileal Crohn's disease.  Treated with Entocort.   - 2014 - September, hospitalized with severe diarrhea, was treated with Prednisone and then Humira.  Humira stopped in 2015 due to joint symptoms and (+)ANA. Then treated with Entocort + MTX and doing well since then.   - 2016 - continued on MTX 25mg  / week  - 2017 - self-discontinued MTX, no GI follow up  - 2019 - re-established GI care with [place]  at [facility].  Restarted methotrexate.    - 2020 - February - started Stelara.  Primary non-response  - 2021 - EGD / Colonoscopy 01/2020 - mild ileal disease.  Good response to Entocort, started Vedolizumab 02/2020.    - 2022 - disease flare on Entyvio, 6+ BM/day, changed to Infliximab + methotrexate for joint pain  - 2023 -  patient self discontinued methotrexate in 2023  - 2024 - On infliximab monotherapy. Persistent diarrhea despite adequate intraluminal disease control based on EGD/Colon/CTE    Endoscopy:      - Colonoscopy 10/14/13 - mild ileal Crohn's disease with aphthae, Two 3-73mm polyps in rectosigmoid colon (Path = tubular adenoma), otherwise normal colon.   - Colonoscopy 04/12/18 - poor prep - moderate ileitis for at least 10cm. Otherwise normal colon.    - EGD 02/13/2020 - enlarged gastric folds (bx - benign), otherwise normal esophagus, stomach, duodenum.   - Colonoscopy 02/13/2020 - 4mm descending colon polyp.  10mm ascending colon.  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  Crohn's disease with moderate ileitis for at least 10cm - similar to Jan 2020 colonoscopy. SES-CD = 4.   PATH = ileum - mildly active chronic enteritis.  No granulomas.  Polyps - sessile serrated (ascending), adenoma (descending).   - EGD 12/24/2022 - normal esophagus, enlarged gastric folds, mild stenosis and ulceration at the pylorus. Mild erythematous duodenopathy.    PATH = Stomach (body) - gastric fundic gland polyp and PPI effect.  Stomach (antrum) - reactive gastropathy, No H.pylori. Duodenum - normal duodenal mucosa.   - Colonoscopy 12/24/2022 - internal hemorrhoids.  4mm sigmoid polyp (path = lymphoid aggregate).  Otherwise normal colon.  Normal terminal ileum for 30cm and no stenosis at ileocecal valve.    Imaging:    - CT A/P 10/22/13 - normal bowel with no signs of active inflammatino.   - CT-E 03/24/18 - inflammation and thickening of terminal ileum consistent with Crohn's flare. Bilateral renal stones.   - CT-E 11/26/2022 - no acute or active Crohn's colitis, persistent diffuse gastric wall thickening (similar to prior), diffuse thickening of the pylorus and moderate distension of the stomach. Short segment focal wall thickening of the sigmoid for 12cm.     Prior IBD medications (type, dose, duration, response):  []  5-ASAs  x Oral corticosteroids - Prednisone, Entocort  []  Intravenous corticosteroids  []  Antibiotics  []  Thiopurines  x Methotrexate - started April 2015.  Re-challenge 2019 - no improvement  x Anti-TNF therapies - Humira 2014 - then developed joint pains and (+)ANA so it was stopped.  Remicade 08/17/2020.   x Anti-Interleukin therapies - Stelara 07/2018 - 01/2020 - primary non-response.   x Anti-Integrin therapies - Vedolizumab 02/2020 - Feb 2022 - primary non-response  []  Cyclosporine  []  Clinical trial medication  []  Other (Please specify):    Extraintestinal manifestations: -joint pains affecting: n  -eye: n  -skin: n  -oral ulcers :  n  -blood clots: n  -PSC: n  -other: n          Past Medical History:   Past medical history:   Past Medical History:   Diagnosis Date    AC (acromioclavicular) joint bone spurs     in back    Bulging discs     Crohn's disease (CMS-HCC)     Degenerative disc disease     GERD (gastroesophageal reflux disease)     Hypertension     Stroke (CMS-HCC) 2014    topical numbness on right side  Past surgical history:   Past Surgical History:   Procedure Laterality Date    CERVICAL FUSION  2016    KNEE ARTHROSCOPY Right     PR COLONOSCOPY W/BIOPSY SINGLE/MULTIPLE N/A 04/12/2018    Procedure: COLONOSCOPY, FLEXIBLE, PROXIMAL TO SPLENIC FLEXURE; WITH BIOPSY, SINGLE OR MULTIPLE;  Surgeon: [name], MD;  Location: GI PROCEDURES [place] [facility];  Service: Gastroenterology    PR COLONOSCOPY W/BIOPSY SINGLE/MULTIPLE Left 02/13/2020    Procedure: COLONOSCOPY, FLEXIBLE, PROXIMAL TO SPLENIC FLEXURE; WITH BIOPSY, SINGLE OR MULTIPLE;  Surgeon: [name], MD;  Location: [facility] PROC[facility] [place] [facility];  Service: Gastroenterology    PR COLSC FLX W/RMVL OF TUMOR POLYP LESION SNARE TQ N/A 10/14/2013    Procedure: COLONOSCOPY FLEX; W/REMOV TUMOR/LES BY SNARE;  Surgeon: Theadore Nan, MD;  Location: [facility] [place] [facility];  Service: Gastroenterology    PR COLSC FLX W/RMVL OF TUMOR POLYP LESION SNARE TQ Left 02/13/2020    Procedure: COLONOSCOPY FLEX; W/REMOV TUMOR/LES BY SNARE;  Surgeon: [name], MD;  Location: [facility] [place] [facility];  Service: Gastroenterology    PR COLSC FLX W/RMVL OF TUMOR POLYP LESION SNARE TQ Left 12/24/2022    Procedure: COLONOSCOPY FLEX; W/REMOV TUMOR/LES BY SNARE;  Surgeon: Monte Fantasia, MD;  Location: [facility] [facility];  Service: Gastroenterology    PR UPPER GI ENDOSCOPY,BIOPSY Left 02/13/2020    Procedure: UGI ENDOSCOPY; WITH BIOPSY, SINGLE OR MULTIPLE;  Surgeon: [name], MD;  Location: [facility] [place] [facility];  Service: Gastroenterology    PR UPPER GI ENDOSCOPY,BIOPSY Left 12/24/2022    Procedure: UGI ENDOSCOPY; WITH BIOPSY, SINGLE OR MULTIPLE;  Surgeon: Monte Fantasia, MD;  Location: [facility] PROC[facility] [facility];  Service: Gastroenterology     Family history:   Family History   Problem Relation Age of Onset    Cancer Mother         breast    Crohn's disease Neg Hx     Ulcerative colitis Neg Hx     Colorectal Cancer Neg Hx      Social history:   Social History     Socioeconomic History    Marital status: Married   Tobacco Use    Smoking status: Every Day     Current packs/day: 0.25     Average packs/day: 0.3 packs/day for 13.6 years (3.4 ttl pk-yrs)     Types: Cigarettes     Start date: 06/2009    Smokeless tobacco: Never   Vaping Use    Vaping status: Never Used   Substance and Sexual Activity    Alcohol use: No     Comment: heavy drinker in the past, quit 10 years     Drug use: No           Allergies:     Allergies   Allergen Reactions    Potassium Clavulanate      Other reaction(s): rash             Medications:     Current Outpatient Medications   Medication Sig Dispense Refill    aspirin 325 MG tablet Take 1 tablet (325 mg total) by mouth daily.      baclofen (LIORESAL) 10 MG tablet       BACLOFEN ORAL Take 5 mg by mouth nightly.      gabapentin (NEURONTIN) 300 MG capsule Take 1 capsule (300 mg total) by mouth Three (3) times a day.      HYDROcodone-acetaminophen (NORCO) 7.5-325 mg per tablet Take 3 tablets by mouth  in the morning.      infliximab (REMICADE IV) Remicade      lisinopril (PRINIVIL,ZESTRIL) 20 MG tablet Take 0.5 tablets (10 mg total) by mouth every other day. prn      rosuvastatin (CRESTOR) 20 MG tablet Take 1 tablet (20 mg total) by mouth in the morning.      tamsulosin (FLOMAX) 0.4 mg capsule       cholestyramine (QUESTRAN) 4 gram packet Take 1 packet by mouth two (2) times a day. Please avoid taking around the same time as other medications 60 each 2    esomeprazole (NEXIUM) 40 MG capsule Take 1 capsule (40 mg total) by mouth two (2) times a day.      famotidine (PEPCID) 20 MG tablet Take 1 tablet (20 mg total) by mouth at bedtime. 30 tablet 5     No current facility-administered medications for this visit.           Physical Exam:   BP 155/98  - Pulse 76  - Temp 36.9 ??C (98.4 ??F) (Temporal)  - Ht 175.3 cm (5' 9)  - Wt 75.8 kg (167 lb)  - BMI 24.66 kg/m??     GEN: middle aged male in no apparent distress, appears comfortable on exam  NECK: Supple, no lymphadenopathy  LUNGS: unlabored breathing  CV: regular rate, no edema  ABD: Soft, NTND  Extremities: no cyanosis, clubbing or edema, normal gait  Psych: affect appropriate, A&O x3  SKIN: no visible lesions on face, neck, arms, abdomen          Labs, Data & Indices:     Lab Review:   Lab Results   Component Value Date    WBC 6.3 12/17/2022    WBC 6.4 08/29/2014    RBC 4.32 12/17/2022    RBC 4.25 (L) 08/29/2014    HGB 14.2 12/17/2022    HGB 14.2 08/29/2014     Lab Results   Component Value Date    AST 21 12/17/2022    AST 18 (L) 08/29/2014    ALT 20 12/17/2022    ALT 27 08/29/2014    BUN 14 03/13/2020    BUN 19 10/04/2013    Creatinine Whole Blood, POC 1.1 02/24/2020    Creatinine 0.86 03/13/2020    CO2 26.6 03/13/2020    CO2 25 10/04/2013    Albumin 4.0 03/13/2020    Albumin 4.3 08/29/2014    Calcium 10.1 03/13/2020    Calcium 9.8 10/04/2013     Lab Results   Component Value Date    TSH 1.52 10/04/2013       Anda Kraft Index for Crohn's Disease   General Well Being: 1 = Slightly below par  Abdominal Pain:  0 = none  Number of Liquid Stools Per day: 8-10 BMs  Abdominal Mass:  0 = None  Number of Extraintestinal Manifestations:  1   1 point each for: 1. Arthritis/arthralgias, 2. Iritis/uveitis, 3. Active perianal dz, 4. Other active fistula, 5. Erythema nodosum or pyoderma, 6. Other    Total HBI Score (0-25):  10     Score:  Remission < 5;  Mild dz 5-7;  Moderate dz 8-16;  Severe > 16  ............................................................................................................................................  ORDERS THIS VISIT:       Diagnosis ICD-10-CM Associated Orders   1. Crohn's disease of small intestine without complication (CMS-HCC)  K50.00 C. Difficile Assay     GI Pathogen Panel ([place])      2. High risk medication use  Z79.899       3. Diarrhea, unspecified type  R19.7 cholestyramine (QUESTRAN) 4 gram packet      4. Crohn's disease of small intestine with other complication (CMS-HCC)  K50.018 GI Pathogen Panel [facility])                 Assessment & Recommendations:   Disease state:  Active Crohn's flare    Raymond Wright is a 67 y.o. male with a hx of ileal Crohn's disease dating back to ~2011.  He was previously on humira but stopped due to worsening joint pains and (+)ANA. He was on MTX from ~2015 - 2017, but he self discontinued this medication since he was feeling well.  He developed inflammatory ileal Crohn's flare in fall 2019. Since then, he has had a poor response to Entocort + methotrexate, Stelara 2020-2021 and worsening symptoms on Vedolizumab.  We started Remicade(w/ MTX) in Feb 2022 and he has had a fairly good symptom response(2 Bm's daily), though still has intermittent mild  obstructive symptoms and recently has had weight loss(per patient intentional) and elevated CRP. Acute worsening in diarrhea in about May with 8-10 loose stools per day without blood. Colonoscopy and CTE without significant evidence of disease. EGD with narrowed pylorus, gastritis, and mild ulceration. Overall, diarrhea symptoms are unlikely a sequale of uncontrolled CD based on negative CRP and endoscopic/imaging evaluation. May be post infectious IBS, bile acid diarrhea, or IBS-D subtype. Will evaluate with infectious stool studies and try empiric cholestyramine.     PLAN:   1.  Continue Remicade at current dosing. OK with him having stopped methotrexate   2.  CBC, CMP, Iron panel, ferritin, inflix level with next infusion  3.  Increase PPI to BID for 6-8 trial  4.  Try empiric cholestyramine once to BID depending on symptoms control   5.  Follow up in 3 months    IBD health maintenance:  Influenza vaccine: 03/12/18, flu vaccine today  Pneumonia vaccine:  Prevnar 08/10/15, Pneumovax ~2012, Pneumovax 03/12/18  COVID19 Vaccine:  2 shots spring 2021, recommend booster  Hepatitis B: sAg neg 03/12/18  TB testing: Quantiferon neg 03/12/18  Chickenpox/Shingles history:  Had chickenpox, Shingrix today 2/2 today  Bone denistometry:   Derm appointment:  Last colonoscopy:  2021  --------------------------------------------  Author: [name], MD 01/27/2023 6:08 PM  ==========================================================  I saw and evaluated Liane Comber with GI fellow Dr. Ollen Gross.  I participated in key portions of the service and personally reviewed the plan of care with the patient.  I reviewed the fellow's note and agree with the fellow???s findings and plan.     Additional thoughts are below: He was doing well at his baseline until May 2024 when he developed abrupt onset of intense diarrhea.  This is very bothersome at this time.  He is having 8-10 bowel movements per day for 5 days straight and then will have 1 to 2 days with no bowel movements.  Then the cycle will reset with diarrhea.  He has never had this before and is not sure of the etiology.  He does not recall any changes in medications, diet, travel or infections in May when this all began.    Recent upper endoscopy, colonoscopy, and CT enterography which showed no signs of active inflammation Crohn's disease.  Appears very possible that he may have irritable bowel syndrome with diarrhea predominance (though the symptoms of only been present for 2 months).  He may have also developed a gastrointestinal infection in  April or May and is now suffering from close infections irritable bowel syndrome.  Other etiologies include bile acid diarrhea in the setting of longstanding ileal Crohn's disease, otherwise no explanation for why this would start abruptly 2 months ago.  Finally atypical infections seem less likely are certainly possible.  Rare etiologies would include carcinoid syndrome, zollinger-ellison syndrome and other rare diseases (e.g., VIPoma) - these seem unlikely but we can reevaluate in the future.     The time being, we will start with baseline lab work including stool studies for atypical infection.  Will initiate a trial of BID PPI therapy for enlarged gastric folds, pyloric stenosis and pyloric channel ulceration seen in the recent upper endoscopy.  Will also start cholestyramine to see if this helps the diarrhea. I explained to avoid this within 2 hours of other medications and how to titrate the medication.  If symptoms or not improved then we could try an empiric trial of antibiotics for SIBOand then possibly more intensive therapy for IBS.    [name], MD  Associate Professor of Medicine  Division of Gastroenterology & Hepatology  [place] [facility]  ============================================

## 2023-01-27 NOTE — Unmapped (Addendum)
Raymond Wright it was a pleasure seeing you today.  Here is a summary from today's visit:     1.  We will order some infectious stool studies  2.  Start Cholestyramine packets for symptomatic diarrhea support  3.  Continue infliximab  4.  We will add labs with your next infusion  5.  If any trouble or symptoms, do not hesitate to call.   6.  Increase your nexium to twice daily for 6-8 weeks then return to daily    Raymond Bills, MD  Associate Professor of Medicine  Division of Gastroenterology & Hepatology  [place] [facility]        EXPECTATIONS FOR PATIENT FOLLOW UP AND COMMUNICATION:  -- Follow up Appointments:  Crohn's disease and Ulcerative colitis are serious chronic inflammatory diseases which require close monitoring.  I expect to see patients for a follow up visits at least every 6 months (or more often if you are having a flare, starting new therapy, etc).  In select cases, patients who are only on aminosalicylates / mesalamine, may be seen once per year for follow up.  If you are not able to come for follow up appointments we may not be able to refill your medications or continue caring for you.   -- Appointment Type: While we are continuing to offer video appointments in select cases (stable patients who are not in a flare and without new/active symptoms) during the COVID19 pandemic, I expect to see patients in person at least once per year.   -- Communication:  if you have any questions or concerns, you can communicate with [place] via phone (contact information below) or via myChart.  Note that phone messages are given higher priority and triaged first. For urgent issues, please contact us via phone.      IBD NURSE COORDINATOR CONTACT - Christy Gentles, RN     Phone: [phone] (direct line)      Fax: [phone]  * For urgent medical concerns after hours or on weekends and holidays, call [phone] and ask to speak to the GI Fellow on call.    * If you have a GI medical question or GI symptoms and would like to speak to your provider's healthcare team, please contact Christy Gentles, RN (contact information above) OR you can send the GI healthcare team a message through MyChart at [email or web address]    [facility] MESSAGES  -- MyChart messages and advice requests are now going to be sent to our clinical care team. A team member will respond to the message or send it to your provider if needed.   -- The message will typically be addressed within 3-4 business days if it is an issue that can be handled by MyChart.   -- For multiple questions, complex concerns, and/or if you have not been by seen by your provider in the recommended follow-up period you may be asked to schedule a clinic visit.   -- MyChart messages are NOT read after hours or on weekends. MyChart is NOT meant for urgent issues or emergencies. For emergencies call 911 or go to the nearest emergency department.     APPOINTMENT SCHEDULING FOR GI CLINIC AND GI PROCEDURES:  RADIOLOGY - to schedule imaging ordered, please call [phone]  GI PROCEDURES         [phone] option 2   GI MEDICINE CLINIC  [phone] option 1   *To schedule, reschedule, or cancel your GI appointment, please call [phone]. If you are  unable to come to an appointment, please notify us as soon as possible, preferably 24 hours in advance. Doing so may allow other patients with urgent needs to be scheduled in a cancelled appointment slot.     TEST RESULTS   If you have a MyChart account, your new results and a provider message will be sent to you through your MyChart account at [email or web address]. For results that require follow-up, a member of your healthcare team will also contact you.    PRESCRIPTION REFILL REQUESTS  To request prescription refills, please contact your pharmacy or send your healthcare team a message through your MyChart account at [email or web address]  RECORD REQUESTS  For questions related to medical records, please call Medical Records Release of Information at [phone]  FINANCIAL NAVIGATOR   For billing and other financial questions/needs - please contact Kathrin Greathouse - [phone]. If you need to leave a message, please be sure to leave your full name, date of birth or Medical record number, best call back # and reason for call.    For educational material and resources:  [email or web address]    ================================================================

## 2023-01-28 MED ORDER — ESOMEPRAZOLE MAGNESIUM 40 MG CAPSULE,DELAYED RELEASE
ORAL_CAPSULE | Freq: Two times a day (BID) | ORAL | 1 refills | 30 days | Status: CP
Start: 2023-01-28 — End: 2024-01-28

## 2023-01-28 NOTE — Unmapped (Signed)
Sending in Nexium Script

## 2023-02-04 ENCOUNTER — Ambulatory Visit: Admit: 2023-02-04 | Discharge: 2023-02-04 | Payer: MEDICARE

## 2023-02-04 DIAGNOSIS — K50018 Crohn's disease of small intestine with other complication: Principal | ICD-10-CM

## 2023-02-04 DIAGNOSIS — K50914 Crohn's disease, unspecified, with abscess: Principal | ICD-10-CM

## 2023-02-04 DIAGNOSIS — K5000 Crohn's disease of small intestine without complications: Principal | ICD-10-CM

## 2023-02-04 DIAGNOSIS — K21 Gastroesophageal reflux disease with esophagitis without hemorrhage: Principal | ICD-10-CM

## 2023-02-04 LAB — CBC W/ AUTO DIFF
BASOPHILS ABSOLUTE COUNT: 0.1 10*9/L (ref 0.0–0.1)
BASOPHILS RELATIVE PERCENT: 1.7 %
EOSINOPHILS ABSOLUTE COUNT: 0.8 10*9/L — ABNORMAL HIGH (ref 0.0–0.5)
EOSINOPHILS RELATIVE PERCENT: 11.2 %
HEMATOCRIT: 41.2 % (ref 39.0–48.0)
HEMOGLOBIN: 13.9 g/dL (ref 12.9–16.5)
LYMPHOCYTES ABSOLUTE COUNT: 1.7 10*9/L (ref 1.1–3.6)
LYMPHOCYTES RELATIVE PERCENT: 24.7 %
MEAN CORPUSCULAR HEMOGLOBIN CONC: 33.6 g/dL (ref 32.0–36.0)
MEAN CORPUSCULAR HEMOGLOBIN: 32.6 pg — ABNORMAL HIGH (ref 25.9–32.4)
MEAN CORPUSCULAR VOLUME: 97 fL — ABNORMAL HIGH (ref 77.6–95.7)
MEAN PLATELET VOLUME: 8 fL (ref 6.8–10.7)
MONOCYTES ABSOLUTE COUNT: 0.4 10*9/L (ref 0.3–0.8)
MONOCYTES RELATIVE PERCENT: 5.7 %
NEUTROPHILS ABSOLUTE COUNT: 3.9 10*9/L (ref 1.8–7.8)
NEUTROPHILS RELATIVE PERCENT: 56.7 %
PLATELET COUNT: 244 10*9/L (ref 150–450)
RED BLOOD CELL COUNT: 4.25 10*12/L — ABNORMAL LOW (ref 4.26–5.60)
RED CELL DISTRIBUTION WIDTH: 12.9 % (ref 12.2–15.2)
WBC ADJUSTED: 6.8 10*9/L (ref 3.6–11.2)

## 2023-02-04 LAB — C-REACTIVE PROTEIN: C-REACTIVE PROTEIN: <4 mg/L (ref <=10.0)

## 2023-02-04 LAB — AST: AST (SGOT): 19 U/L (ref <=34)

## 2023-02-04 LAB — ALT: ALT (SGPT): 18 U/L (ref 10–49)

## 2023-02-04 MED ADMIN — inFLIXimab-axxq (AVSOLA) 5 mg/kg = 400 mg in sodium chloride (NS) 250 mL IVPB: 5 mg/kg | INTRAVENOUS | @ 15:00:00 | Stop: 2023-02-04

## 2023-02-04 NOTE — Unmapped (Addendum)
Received PA request for pt's Nexium. ePA pushed for MAP team assistance.    Appeal approved through 06/30/23.

## 2023-02-04 NOTE — Unmapped (Signed)
Patient presents for accelerated Avsola (infliximab-axxq) infusion.  In no acute distress.  Vitals stable.  Reports no new medical issues or S/S of infection.  IV started.  See MAR for premeds.    1104 Avsola (infliximab-axxq) 400 mg started to infuse as follows:     100 ml/hr x 15 min then 300 ml/hr for remainder of infusion.    1205 Avsola (infliximab-axxq) infusion complete.  PIV flushed with NS.  Vitals stable.  Patient without any s/s of adverse reaction.  IV d/c'd.  Patient discharged from Infusion Center.

## 2023-02-06 DIAGNOSIS — A0472 Enterocolitis due to Clostridium difficile, not specified as recurrent: Principal | ICD-10-CM

## 2023-02-06 MED ORDER — VANCOMYCIN 125 MG CAPSULE
ORAL_CAPSULE | Freq: Four times a day (QID) | ORAL | 0 refills | 14.00000 days | Status: CP
Start: 2023-02-06 — End: 2023-02-06

## 2023-02-06 NOTE — Unmapped (Signed)
Pt tested positive for C. Diff. Discussed with Dr. Raphael Gibney and Vanco 125mg  QID for 14 days sent to pt's pharmacy. Information on preventing the spread of C. Diff sent to pt via mychart.     [employer] has 20 tablets in stock. Pt will pick these up today. Remaining 36 tablets sent to [facility] in [place], [place], where pt will be going on vacation. Reviewed importance of hand hygiene with pt.

## 2023-03-27 DIAGNOSIS — K50914 Crohn's disease, unspecified, with abscess: Principal | ICD-10-CM

## 2023-03-30 ENCOUNTER — Ambulatory Visit: Admit: 2023-03-30 | Discharge: 2023-03-31 | Payer: MEDICARE

## 2023-03-30 LAB — CBC W/ AUTO DIFF
BASOPHILS ABSOLUTE COUNT: 0.1 10*9/L (ref 0.0–0.1)
BASOPHILS RELATIVE PERCENT: 1.4 %
EOSINOPHILS ABSOLUTE COUNT: 0.6 10*9/L — ABNORMAL HIGH (ref 0.0–0.5)
EOSINOPHILS RELATIVE PERCENT: 9.8 %
HEMATOCRIT: 41.6 % (ref 39.0–48.0)
HEMOGLOBIN: 14.4 g/dL (ref 12.9–16.5)
LYMPHOCYTES ABSOLUTE COUNT: 2 10*9/L (ref 1.1–3.6)
LYMPHOCYTES RELATIVE PERCENT: 32.5 %
MEAN CORPUSCULAR HEMOGLOBIN CONC: 34.6 g/dL (ref 32.0–36.0)
MEAN CORPUSCULAR HEMOGLOBIN: 33.3 pg — ABNORMAL HIGH (ref 25.9–32.4)
MEAN CORPUSCULAR VOLUME: 96.3 fL — ABNORMAL HIGH (ref 77.6–95.7)
MEAN PLATELET VOLUME: 7.5 fL (ref 6.8–10.7)
MONOCYTES ABSOLUTE COUNT: 0.5 10*9/L (ref 0.3–0.8)
MONOCYTES RELATIVE PERCENT: 7.8 %
NEUTROPHILS ABSOLUTE COUNT: 2.9 10*9/L (ref 1.8–7.8)
NEUTROPHILS RELATIVE PERCENT: 48.5 %
PLATELET COUNT: 273 10*9/L (ref 150–450)
RED BLOOD CELL COUNT: 4.32 10*12/L (ref 4.26–5.60)
RED CELL DISTRIBUTION WIDTH: 12.8 % (ref 12.2–15.2)
WBC ADJUSTED: 6 10*9/L (ref 3.6–11.2)

## 2023-03-30 LAB — ALT: ALT (SGPT): 32 U/L (ref 10–49)

## 2023-03-30 LAB — C-REACTIVE PROTEIN: C-REACTIVE PROTEIN: <4 mg/L (ref <=10.0)

## 2023-03-30 LAB — AST: AST (SGOT): 22 U/L (ref <=34)

## 2023-03-30 MED ADMIN — inFLIXimab-axxq (AVSOLA) 5 mg/kg = 400 mg in sodium chloride (NS) 250 mL IVPB: 5 mg/kg | INTRAVENOUS | @ 15:00:00 | Stop: 2023-03-30

## 2023-03-30 NOTE — Unmapped (Signed)
Patient presents for accelerated Avsola (infliximab-axxq) infusion.  In no acute distress.  Vitals stable.  Reports no new medical issues or S/S of infection.  IV started.  See MAR for premeds.    1042 Avsola (infliximab-axxq) 400 mg started to infuse as follows:     100 ml/hr x 15 min then 300 ml/hr for remainder of infusion.    1146 Avsola (infliximab-axxq) infusion complete.  PIV flushed with NS.  Vitals stable.  Patient without any s/s of adverse reaction.    IV d/c'd.  Patient discharged from Infusion Center.

## 2023-03-31 DIAGNOSIS — K50914 Crohn's disease, unspecified, with abscess: Principal | ICD-10-CM

## 2023-04-02 NOTE — Unmapped (Addendum)
Reason For Call:  Called pt to check in since significant IBS diarrhea in July, followed by C. Diff infection in August.     Assessment:   Pt reports currently feeling 100% better, reports no diarrhea since completing the antibiotics for C. Diff. Pt having 1-2 formed, soft bowel movements daily. Denies abdominal pain and loss of appetite. States he has still been experiencing periodic nausea, about 2-3 times per week. He stopped taking the cholestyramine after about ten days because of GI side effects.    Follow Up:  Pt has follow up scheduled with Dr. Raphael Gibney on 11/12    Workup Time:   Time spent 15 mins

## 2023-05-11 NOTE — Unmapped (Signed)
FOLLOW UP CONSULT NOTE - INFLAMMATORY BOWEL DISEASE  05/12/2023    Demographics:  Raymond Wright is a 67 y.o. year old male    Diagnosis:  Crohn's Disease  Disease onset (yr):  2011  Age at onset:  > 36yr old (A3)  Location:  Ileal (L1)  Behavior:  Nonstricturing,nonpenetrating (B1)  Current Tight Control Scenario:   Maintenance = Biologic    Referring physician:   Pcp, None Per Patient  [address]  [place],  [place] [address]          HPI / NOTE :     Interval Events:   1.  Last visit with me May July 2024 - worsening diarrhea x 2 mo.  Later found to have (+)C.diff EIA and PCR confirmation. Rx PO Vancomycin with improvement.   2.  I reviewed labs rom 03/30/23 - CBC unremarkable - WBC 6, Hgb 14.4, MCV 96, Plt 273, AST/ALT 22/32, CRP normal.     HPI:  Has been doing well since the last visit - diarrhea has resolved since started po vanc. Was not taking any antibiotics prior to the onset of C diff. Notes that his appetite is increased since GI symptoms have resolved. Was previously only eating 1 meal per day in the evening; no eating three meals per day. Has had intermittent nausea despite PPI BID, but has improved.     Extraintestinal symptoms   Eye - None   Skin - None   Joints - Left knee pain, digit pain - not new, has persistent pain/stiffness throughout the day     Abdominal pain (0-10):  None; was having RLQ tenderness at last visit   BM a day: 1x/day (improved from 8-10 times per day with Cdiff which was diagnosed in 10/2022)  Consistency: soft, formed (was loose, diarrhea at last visit)  % of stools have blood: 0%  Nocturnal BM: no  Urgency:  no (previously was having urgency)  Weight change in past 6 mo: No  Smoking: yes, 4-5 cigarettes per day  NSAIDS: full dose aspirin after stroke     Wt Readings from Last 12 Encounters:   05/12/23 77.1 kg (170 lb)   03/30/23 78.5 kg (173 lb)   02/04/23 77.1 kg (169 lb 14.4 oz)   01/27/23 75.8 kg (167 lb)   12/24/22 74.8 kg (165 lb)   12/17/22 76.7 kg (169 lb)   10/22/22 78.8 kg (173 lb 12.8 oz)   08/27/22 79.6 kg (175 lb 8 oz)   07/02/22 80.1 kg (176 lb 8 oz)   05/06/22 78 kg (172 lb)   03/11/22 77.3 kg (170 lb 6.4 oz)   01/14/22 79.1 kg (174 lb 4.8 oz)         Review of Systems:   Review of systems positive for: negative except as above.   Otherwise, the balance of 10 systems is negative.          IBD HISTORY:     Year of disease onset:  2011    Brief IBD Disease Course:    - 2011 - onset of abdominal pain and diarrhea.  Had MRI locally - told thickening of the bowel and then had a colonoscopy and given dx of ileal Crohn's disease.  Treated with Entocort.   - 2014 - September, hospitalized with severe diarrhea, was treated with Prednisone and then Humira.  Humira stopped in 2015 due to joint symptoms and (+)ANA. Then treated with Entocort + MTX and doing well since then.   -  2016 - continued on MTX 25mg  / week  - 2017 - self-discontinued MTX, no GI follow up  - 2019 - re-established GI care with [place] at [facility].  Restarted methotrexate.    - 2020 - February - started Stelara.  Primary non-response  - 2021 - EGD / Colonoscopy 01/2020 - mild ileal disease.  Good response to Entocort, started Vedolizumab 02/2020.    - 2022 - disease flare on Entyvio, 6+ BM/day, changed to Infliximab + methotrexate for joint pain  - 2023 -  patient self discontinued methotrexate in 2023  - 2024 - On infliximab monotherapy. Subacute worsening of diarrhea - dx with C.diff and improved with PO Vancomycin.     Endoscopy:      - Colonoscopy 10/14/13 - mild ileal Crohn's disease with aphthae, Two 3-78mm polyps in rectosigmoid colon (Path = tubular adenoma), otherwise normal colon.   - Colonoscopy 04/12/18 - poor prep - moderate ileitis for at least 10cm. Otherwise normal colon.    - EGD 02/13/2020 - enlarged gastric folds (bx - benign), otherwise normal esophagus, stomach, duodenum.   - Colonoscopy 02/13/2020 - 4mm descending colon polyp.  10mm ascending colon.  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  Crohn's disease with moderate ileitis for at least 10cm - similar to Jan 2020 colonoscopy. SES-CD = 4.   PATH = ileum - mildly active chronic enteritis.  No granulomas.  Polyps - sessile serrated (ascending), adenoma (descending).   - EGD 12/24/2022 - normal esophagus, enlarged gastric folds, mild stenosis and ulceration at the pylorus. Mild erythematous duodenopathy.    PATH = Stomach (body) - gastric fundic gland polyp and PPI effect.  Stomach (antrum) - reactive gastropathy, No H.pylori. Duodenum - normal duodenal mucosa.   - Colonoscopy 12/24/2022 - internal hemorrhoids.  4mm sigmoid polyp (path = lymphoid aggregate).  Otherwise normal colon.  Normal terminal ileum for 30cm and no stenosis at ileocecal valve.    Imaging:    - CT A/P 10/22/13 - normal bowel with no signs of active inflammatino.   - CT-E 03/24/18 - inflammation and thickening of terminal ileum consistent with Crohn's flare. Bilateral renal stones.   - CT-E 11/26/2022 - no acute or active Crohn's colitis, persistent diffuse gastric wall thickening (similar to prior), diffuse thickening of the pylorus and moderate distension of the stomach. Short segment focal wall thickening of the sigmoid for 12cm.     Prior IBD medications (type, dose, duration, response):  []  5-ASAs  x Oral corticosteroids - Prednisone, Entocort  []  Intravenous corticosteroids  []  Antibiotics  []  Thiopurines  x Methotrexate - started April 2015.  Re-challenge 2019 - no improvement  x Anti-TNF therapies - Humira 2014 - then developed joint pains and (+)ANA so it was stopped.  Remicade 08/17/2020.   x Anti-Interleukin therapies - Stelara 07/2018 - 01/2020 - primary non-response.   x Anti-Integrin therapies - Vedolizumab 02/2020 - Feb 2022 - primary non-response  []  Cyclosporine  []  Clinical trial medication  []  Other (Please specify):    Extraintestinal manifestations:   -joint pains affecting: yes but chronic - suspect osteoarthritis  -eye: n  -skin: n  -oral ulcers :  n  -blood clots: n  -PSC: n  -other: n          Past Medical History:   Past medical history:   Past Medical History:   Diagnosis Date    AC (acromioclavicular) joint bone spurs     in back    Bulging discs     Crohn's  disease (CMS-HCC)     Degenerative disc disease     GERD (gastroesophageal reflux disease)     Hypertension     Stroke (CMS-HCC) 2014    topical numbness on right side     Past surgical history:   Past Surgical History:   Procedure Laterality Date    CERVICAL FUSION  2016    KNEE ARTHROSCOPY Right     PR COLONOSCOPY W/BIOPSY SINGLE/MULTIPLE N/A 04/12/2018    Procedure: COLONOSCOPY, FLEXIBLE, PROXIMAL TO SPLENIC FLEXURE; WITH BIOPSY, SINGLE OR MULTIPLE;  Surgeon: [name], MD;  [facility]: [facility];  Service: Gastroenterology    PR COLONOSCOPY W/BIOPSY SINGLE/MULTIPLE Left 02/13/2020    Procedure: COLONOSCOPY, FLEXIBLE, PROXIMAL TO SPLENIC FLEXURE; WITH BIOPSY, SINGLE OR MULTIPLE;  Surgeon: [name], MD;  Location: [facility];  Service: Gastroenterology    PR COLSC FLX W/RMVL OF TUMOR POLYP LESION SNARE TQ N/A 10/14/2013    Procedure: COLONOSCOPY FLEX; W/REMOV TUMOR/LES BY SNARE;  Surgeon: Theadore Nan, MD;  Location: [facility];  Service: Gastroenterology    PR COLSC FLX W/RMVL OF TUMOR POLYP LESION SNARE TQ Left 02/13/2020    Procedure: COLONOSCOPY FLEX; W/REMOV TUMOR/LES BY SNARE;  Surgeon: [name], MD;  Location: [facility];  Service: Gastroenterology    PR COLSC FLX W/RMVL OF TUMOR POLYP LESION SNARE TQ Left 12/24/2022    Procedure: COLONOSCOPY FLEX; W/REMOV TUMOR/LES BY SNARE;  Surgeon: Monte Fantasia, MD;  Location: [facility];  Service: Gastroenterology    PR UPPER GI ENDOSCOPY,BIOPSY Left 02/13/2020    Procedure: UGI ENDOSCOPY; WITH BIOPSY, SINGLE OR MULTIPLE;  Surgeon: [name], MD;  [facility];  Service: Gastroenterology    PR UPPER GI ENDOSCOPY,BIOPSY Left 12/24/2022 Procedure: UGI ENDOSCOPY; WITH BIOPSY, SINGLE OR MULTIPLE;  Surgeon: Monte Fantasia, MD;  Location: [facility];  Service: Gastroenterology     Family history:   Family History   Problem Relation Age of Onset    Cancer Mother         breast    Crohn's disease Neg Hx     Ulcerative colitis Neg Hx     Colorectal Cancer Neg Hx      Social history:   Social History     Socioeconomic History    Marital status: Married     Spouse name: None    Number of children: None    Years of education: None    Highest education level: None   Tobacco Use    Smoking status: Every Day     Current packs/day: 0.25     Average packs/day: 0.3 packs/day for 13.9 years (3.5 ttl pk-yrs)     Types: Cigarettes     Start date: 06/2009    Smokeless tobacco: Never   Vaping Use    Vaping status: Never Used   Substance and Sexual Activity    Alcohol use: No     Comment: heavy drinker in the past, quit 10 years     Drug use: No           Allergies:     Allergies   Allergen Reactions    Potassium Clavulanate      Other reaction(s): rash             Medications:     Current Outpatient Medications   Medication Sig Dispense Refill    aspirin 325 MG tablet Take 1 tablet (325 mg total)  by mouth daily.      baclofen (LIORESAL) 10 MG tablet       BACLOFEN ORAL Take 5 mg by mouth nightly.      esomeprazole (NEXIUM) 40 MG capsule Take 1 capsule (40 mg total) by mouth two (2) times a day. 60 capsule 1    gabapentin (NEURONTIN) 300 MG capsule Take 1 capsule (300 mg total) by mouth Three (3) times a day.      HYDROcodone-acetaminophen (NORCO) 7.5-325 mg per tablet Take 3 tablets by mouth in the morning.      infliximab (REMICADE IV) Remicade      lisinopril (PRINIVIL,ZESTRIL) 20 MG tablet Take 0.5 tablets (10 mg total) by mouth every other day. prn      rosuvastatin (CRESTOR) 20 MG tablet Take 1 tablet (20 mg total) by mouth in the morning.      tamsulosin (FLOMAX) 0.4 mg capsule       cholestyramine (QUESTRAN) 4 gram packet Take 1 packet by mouth two (2) times a day. Please avoid taking around the same time as other medications 60 each 2    esomeprazole (NEXIUM) 40 MG capsule Take 1 capsule (40 mg total) by mouth daily before breakfast. 90 capsule 3    famotidine (PEPCID) 20 MG tablet Take 1 tablet (20 mg total) by mouth at bedtime. 30 tablet 5     No current facility-administered medications for this visit.           Physical Exam:   BP 142/93  - Pulse 81  - Temp 36.5 ??C (97.7 ??F)  - Ht 175.3 cm (5' 9.02)  - Wt 77.1 kg (170 lb)  - BMI 25.09 kg/m??     GEN: no apparent distress, appears comfortable on exam  HEENT: OP clear with no erythema, lesions, exudate, mucous membranes moist  NECK: Supple, no lymphadenopathy  LUNGS: CTAB, no wheezes, rales, or rhonchi  CV: S1/S2, RRR, no murmurs  ABD: Soft, nontender, no rebound/guarding, nondistended, normoactive bowel sounds, no appreciable organomegaly  Extremities: no cyanosis, clubbing or edema, normal gait  Psych: affect appropriate, A&O x3  SKIN: no visible lesions on face, neck, arms, abdomen          Labs, Data & Indices:     Lab Review:   Lab Results   Component Value Date    WBC 6.0 03/30/2023    WBC 6.4 08/29/2014    RBC 4.32 03/30/2023    RBC 4.25 (L) 08/29/2014    HGB 14.4 03/30/2023    HGB 14.2 08/29/2014     Lab Results   Component Value Date    AST 22 03/30/2023    AST 18 (L) 08/29/2014    ALT 32 03/30/2023    ALT 27 08/29/2014    BUN 14 03/13/2020    BUN 19 10/04/2013    Creatinine Whole Blood, POC 1.1 02/24/2020    Creatinine 0.86 03/13/2020    CO2 26.6 03/13/2020    CO2 25 10/04/2013    Albumin 4.0 03/13/2020    Albumin 4.3 08/29/2014    Calcium 10.1 03/13/2020    Calcium 9.8 10/04/2013     Lab Results   Component Value Date    TSH 1.52 10/04/2013       Anda Kraft Index for Crohn's Disease   General Well Being: 1 = Slightly below par  Abdominal Pain:  0 = none  Number of Liquid Stools Per day: 8-10 BMs  Abdominal Mass:  0 = None  Number of Extraintestinal  Manifestations:  1   1 point each for: 1. Arthritis/arthralgias, 2. Iritis/uveitis, 3. Active perianal dz, 4. Other active fistula, 5. Erythema nodosum or pyoderma, 6. Other    Total HBI Score (0-25):  10     Score:  Remission < 5;  Mild dz 5-7;  Moderate dz 8-16;  Severe > 16  ............................................................................................................................................  ORDERS THIS VISIT:       Diagnosis ICD-10-CM Associated Orders   1. Crohn's disease of small intestine without complication (CMS-HCC)  K50.00 INFLUENZA IIV3 HIGH DOSE 97YRS+(FLUZONE)      2. High risk medication use  Z79.899 INFLUENZA IIV3 HIGH DOSE 97YRS+(FLUZONE)      3. Gastroesophageal reflux disease without esophagitis  K21.9 esomeprazole (NEXIUM) 40 MG capsule      4. Encounter for immunization  Z23 INFLUENZA IIV3 HIGH DOSE 97YRS+(FLUZONE)              Assessment & Recommendations:   Disease state:  Active Crohn's flare    Raymond Wright is a 67 y.o. male with a hx of ileal Crohn's disease dating back to ~2011.  He was previously on humira but stopped due to worsening joint pains and (+)ANA. He was on MTX from ~2015 - 2017, but he self discontinued this medication since he was feeling well.  He developed inflammatory ileal Crohn's flare in fall 2019. Since then, he has had a poor response to Entocort + methotrexate, Stelara 2020-2021 and worsening symptoms on Vedolizumab.  We started Remicade(w/ MTX) in Feb 2022 and he has had a fairly good symptom response (2 Bm's daily). He had subacute new/worsening diarrhea in May 2024, and was found to have C.diff infection with improvement after vancomycin course.  He is feeling very well at this time and we will continue the current course of therapy.  I did provide guidance on risk factors for recurrent C. difficile infection.  Regarding his upper GI symptoms he is still having some nausea but this is improving.  I would recommend decreasing the Nexium to once daily.  However I would not stop the Nexium given that he has rapid recurrence of acid reflux if he stops the PPI.    PLAN:   1.  Continue Remicade.  2.  Decrease Nexium to 40 mg daily (from twice daily).  3.  If any trouble or symptoms, do not hesitate to call.     IBD health maintenance:  Influenza vaccine: 05/12/2023  Pneumonia vaccine:  Prevnar 08/10/15, Pneumovax ~2012, Pneumovax 03/12/18  COVID19 Vaccine:  2 shots spring 2021, recommend booster  Hepatitis B: sAg neg 03/12/18  TB testing: Quantiferon neg 03/12/18  Chickenpox/Shingles history:  Had chickenpox, Shingrix today 2/2 today  Bone denistometry:   Derm appointment:  Last colonoscopy:  2021  --------------------------------------------  Author: [name], MD 05/12/2023 5:37 PM    [name], MD  Associate Professor of Medicine  Division of Gastroenterology & Hepatology  [place] [facility]  ============================================

## 2023-05-12 ENCOUNTER — Ambulatory Visit: Admit: 2023-05-12 | Discharge: 2023-05-13 | Payer: MEDICARE

## 2023-05-12 DIAGNOSIS — Z23 Encounter for immunization: Principal | ICD-10-CM

## 2023-05-12 DIAGNOSIS — Z79899 Other long term (current) drug therapy: Principal | ICD-10-CM

## 2023-05-12 DIAGNOSIS — K5000 Crohn's disease of small intestine without complications: Principal | ICD-10-CM

## 2023-05-12 DIAGNOSIS — K219 Gastro-esophageal reflux disease without esophagitis: Principal | ICD-10-CM

## 2023-05-12 DIAGNOSIS — K50914 Crohn's disease, unspecified, with abscess: Principal | ICD-10-CM

## 2023-05-12 MED ORDER — ESOMEPRAZOLE MAGNESIUM 40 MG CAPSULE,DELAYED RELEASE
ORAL_CAPSULE | Freq: Every day | ORAL | 3 refills | 90 days | Status: CP
Start: 2023-05-12 — End: ?

## 2023-05-12 NOTE — Unmapped (Addendum)
Raymond Wright it was a pleasure seeing you today.  Here is a summary from today's visit:     1.  Continue Remicade.  2.  Decrease Nexium to 40 mg daily (from twice daily).  3.  If any trouble or symptoms, do not hesitate to call.     [name], MD  Associate Professor of Medicine  Division of Gastroenterology & Hepatology  [facility] [place]          Don't miss the 2024 Crohn's and Colitis Foundation Patient Education Day that will take place on Sat, November 16th, from 9:30 a.m.-12:30 p.m. at the [facility], [address], [place], [place]  [address]. The event is free, but please register by visiting [email or web address]          EXPECTATIONS FOR PATIENT FOLLOW UP AND COMMUNICATION:  -- Follow up Appointments:  Crohn's disease and Ulcerative colitis are serious chronic inflammatory diseases which require close monitoring.  I expect to see patients for a follow up visits at least every 6 months (or more often if you are having a flare, starting new therapy, etc).  In select cases, patients who are only on aminosalicylates / mesalamine, may be seen once per year for follow up.  If you are not able to come for follow up appointments we may not be able to refill your medications or continue caring for you.   -- Appointment Type: While we are continuing to offer video appointments in select cases (stable patients who are not in a flare and without new/active symptoms) during the COVID19 pandemic, I expect to see patients in person at least once per year.   -- Communication:  if you have any questions or concerns, you can communicate with [place] via phone (contact information below) or via myChart.  Note that phone messages are given higher priority and triaged first. For urgent issues, please contact us via phone.      IBD NURSE COORDINATOR CONTACT - Christy Gentles, RN     Phone: [phone] (direct line)      Fax: [phone]  * For urgent medical concerns after hours or on weekends and holidays, call [phone] and ask to speak to the GI Fellow on call.    * If you have a GI medical question or GI symptoms and would like to speak to your provider's healthcare team, please contact Christy Gentles, RN (contact information above) OR you can send the GI healthcare team a message through MyChart at [email or web address]    [facility] MESSAGES  -- MyChart messages and advice requests are now going to be sent to our clinical care team. A team member will respond to the message or send it to your provider if needed.   -- The message will typically be addressed within 3-4 business days if it is an issue that can be handled by MyChart.   -- For multiple questions, complex concerns, and/or if you have not been by seen by your provider in the recommended follow-up period you may be asked to schedule a clinic visit.   -- MyChart messages are NOT read after hours or on weekends. MyChart is NOT meant for urgent issues or emergencies. For emergencies call 911 or go to the nearest emergency department.     APPOINTMENT SCHEDULING FOR GI CLINIC AND GI PROCEDURES:  RADIOLOGY - to schedule imaging ordered, please call [phone]  GI PROCEDURES         [phone] option 2  GI MEDICINE CLINIC  [phone] option 1   *To schedule, reschedule, or cancel your GI appointment, please call [phone]. If you are unable to come to an appointment, please notify us as soon as possible, preferably 24 hours in advance. Doing so may allow other patients with urgent needs to be scheduled in a cancelled appointment slot.     TEST RESULTS   If you have a MyChart account, your new results and a provider message will be sent to you through your MyChart account at [email or web address]. For results that require follow-up, a member of your healthcare team will also contact you.    PRESCRIPTION REFILL REQUESTS  To request prescription refills, please contact your pharmacy or send your healthcare team a message through your MyChart account at [email or web address]  RECORD REQUESTS  For questions related to medical records, please call Medical Records Release of Information at [phone]  FINANCIAL NAVIGATOR   For billing and other financial questions/needs - please contact Kathrin Greathouse - [phone]. If you need to leave a message, please be sure to leave your full name, date of birth or Medical record number, best call back # and reason for call.    For educational material and resources:  [email or web address]    ================================================================

## 2023-05-25 ENCOUNTER — Ambulatory Visit: Admit: 2023-05-25 | Discharge: 2023-05-25 | Payer: MEDICARE

## 2023-05-25 LAB — CBC W/ AUTO DIFF
BASOPHILS ABSOLUTE COUNT: 0.1 10*9/L (ref 0.0–0.1)
BASOPHILS RELATIVE PERCENT: 1.3 %
EOSINOPHILS ABSOLUTE COUNT: 0.5 10*9/L (ref 0.0–0.5)
EOSINOPHILS RELATIVE PERCENT: 8.4 %
HEMATOCRIT: 41.4 % (ref 39.0–48.0)
HEMOGLOBIN: 14.3 g/dL (ref 12.9–16.5)
LYMPHOCYTES ABSOLUTE COUNT: 1.7 10*9/L (ref 1.1–3.6)
LYMPHOCYTES RELATIVE PERCENT: 29.5 %
MEAN CORPUSCULAR HEMOGLOBIN CONC: 34.6 g/dL (ref 32.0–36.0)
MEAN CORPUSCULAR HEMOGLOBIN: 33.3 pg — ABNORMAL HIGH (ref 25.9–32.4)
MEAN CORPUSCULAR VOLUME: 96.4 fL — ABNORMAL HIGH (ref 77.6–95.7)
MEAN PLATELET VOLUME: 7.6 fL (ref 6.8–10.7)
MONOCYTES ABSOLUTE COUNT: 0.4 10*9/L (ref 0.3–0.8)
MONOCYTES RELATIVE PERCENT: 7.6 %
NEUTROPHILS ABSOLUTE COUNT: 3 10*9/L (ref 1.8–7.8)
NEUTROPHILS RELATIVE PERCENT: 53.2 %
PLATELET COUNT: 256 10*9/L (ref 150–450)
RED BLOOD CELL COUNT: 4.3 10*12/L (ref 4.26–5.60)
RED CELL DISTRIBUTION WIDTH: 12.8 % (ref 12.2–15.2)
WBC ADJUSTED: 5.6 10*9/L (ref 3.6–11.2)

## 2023-05-25 LAB — C-REACTIVE PROTEIN: C-REACTIVE PROTEIN: <4 mg/L (ref <=10.0)

## 2023-05-25 LAB — AST: AST (SGOT): 27 U/L (ref <=34)

## 2023-05-25 LAB — ALT: ALT (SGPT): 31 U/L (ref 10–49)

## 2023-05-25 MED ADMIN — inFLIXimab-axxq (AVSOLA) 5 mg/kg = 400 mg in sodium chloride (NS) 100 mL IVPB: 5 mg/kg | INTRAVENOUS | @ 16:00:00 | Stop: 2023-05-25

## 2023-05-25 NOTE — Unmapped (Signed)
Patient presents for accelerated infusion.  In no acute distress.  Vitals stable.  Reports no new medical issues or S/S of infection.  IV started.  See MAR for premeds.     1037 Avsola (infliximab-axxq) 400 mg started to infuse as follows:     40 ml for 15 minutes     Then until complete      1139 Avsola (infliximab-axxq) infusion complete.  PIV flushed with NS.  Vitals stable.  Patient without any s/s of adverse reaction.       IV d/c'd.  Patient discharged from Infusion Center.

## 2023-07-07 DIAGNOSIS — K50914 Crohn's disease, unspecified, with abscess: Principal | ICD-10-CM

## 2023-07-21 ENCOUNTER — Encounter: Admit: 2023-07-21 | Discharge: 2023-07-22 | Payer: MEDICARE

## 2023-07-21 LAB — CBC W/ AUTO DIFF
BASOPHILS ABSOLUTE COUNT: 0.1 10*9/L (ref 0.0–0.1)
BASOPHILS RELATIVE PERCENT: 1.3 %
EOSINOPHILS ABSOLUTE COUNT: 0.6 10*9/L — ABNORMAL HIGH (ref 0.0–0.5)
EOSINOPHILS RELATIVE PERCENT: 9.6 %
HEMATOCRIT: 40.9 % (ref 39.0–48.0)
HEMOGLOBIN: 13.9 g/dL (ref 12.9–16.5)
LYMPHOCYTES ABSOLUTE COUNT: 1.4 10*9/L (ref 1.1–3.6)
LYMPHOCYTES RELATIVE PERCENT: 24.6 %
MEAN CORPUSCULAR HEMOGLOBIN CONC: 33.9 g/dL (ref 32.0–36.0)
MEAN CORPUSCULAR HEMOGLOBIN: 32.6 pg — ABNORMAL HIGH (ref 25.9–32.4)
MEAN CORPUSCULAR VOLUME: 96.2 fL — ABNORMAL HIGH (ref 77.6–95.7)
MEAN PLATELET VOLUME: 7.1 fL (ref 6.8–10.7)
MONOCYTES ABSOLUTE COUNT: 0.4 10*9/L (ref 0.3–0.8)
MONOCYTES RELATIVE PERCENT: 6.7 %
NEUTROPHILS ABSOLUTE COUNT: 3.4 10*9/L (ref 1.8–7.8)
NEUTROPHILS RELATIVE PERCENT: 57.8 %
PLATELET COUNT: 256 10*9/L (ref 150–450)
RED BLOOD CELL COUNT: 4.26 10*12/L (ref 4.26–5.60)
RED CELL DISTRIBUTION WIDTH: 13.2 % (ref 12.2–15.2)
WBC ADJUSTED: 5.9 10*9/L (ref 3.6–11.2)

## 2023-07-21 LAB — AST: AST (SGOT): 25 U/L (ref <=34)

## 2023-07-21 LAB — ALT: ALT (SGPT): 30 U/L (ref 10–49)

## 2023-07-21 LAB — C-REACTIVE PROTEIN: C-REACTIVE PROTEIN: <4 mg/L (ref <=10.0)

## 2023-07-21 MED ADMIN — inFLIXimab-axxq (AVSOLA) 5 mg/kg = 400 mg in sodium chloride (NS) 100 mL IVPB: 5 mg/kg | INTRAVENOUS | @ 15:00:00 | Stop: 2023-07-21

## 2023-07-21 NOTE — Unmapped (Signed)
Patient presents for accelerated Avsola (infliximab-axxq) infusion.  In no acute distress.  Vitals stable.  Reports no new medical issues or S/S of infection.  IV started.  See MAR for premeds.    1027 Avsola (infliximab-axxq) 400 mg started to infuse as follows:     40 mg/hr for 15 minutes and then  120 ml/hr until bag is empty.     1127 Avsola (infliximab-axxq) infusion complete.  PIV flushed with NS.  Vitals stable.  Patient without any s/s of adverse reaction.IV d/c'd.  Patient discharged from Infusion Center.

## 2023-08-30 DIAGNOSIS — K50914 Crohn's disease, unspecified, with abscess: Principal | ICD-10-CM

## 2023-09-15 ENCOUNTER — Encounter: Admit: 2023-09-15 | Discharge: 2023-09-16 | Payer: MEDICARE

## 2023-09-15 LAB — CBC W/ AUTO DIFF
BASOPHILS ABSOLUTE COUNT: 0.1 10*9/L (ref 0.0–0.1)
BASOPHILS RELATIVE PERCENT: 1.3 %
EOSINOPHILS ABSOLUTE COUNT: 0.4 10*9/L (ref 0.0–0.5)
EOSINOPHILS RELATIVE PERCENT: 6.9 %
HEMATOCRIT: 41 % (ref 39.0–48.0)
HEMOGLOBIN: 14.4 g/dL (ref 12.9–16.5)
LYMPHOCYTES ABSOLUTE COUNT: 1.3 10*9/L (ref 1.1–3.6)
LYMPHOCYTES RELATIVE PERCENT: 22.3 %
MEAN CORPUSCULAR HEMOGLOBIN CONC: 35.2 g/dL (ref 32.0–36.0)
MEAN CORPUSCULAR HEMOGLOBIN: 33.5 pg — ABNORMAL HIGH (ref 25.9–32.4)
MEAN CORPUSCULAR VOLUME: 95.1 fL (ref 77.6–95.7)
MEAN PLATELET VOLUME: 7.6 fL (ref 6.8–10.7)
MONOCYTES ABSOLUTE COUNT: 0.4 10*9/L (ref 0.3–0.8)
MONOCYTES RELATIVE PERCENT: 7.4 %
NEUTROPHILS ABSOLUTE COUNT: 3.7 10*9/L (ref 1.8–7.8)
NEUTROPHILS RELATIVE PERCENT: 62.1 %
PLATELET COUNT: 303 10*9/L (ref 150–450)
RED BLOOD CELL COUNT: 4.31 10*12/L (ref 4.26–5.60)
RED CELL DISTRIBUTION WIDTH: 13.4 % (ref 12.2–15.2)
WBC ADJUSTED: 5.9 10*9/L (ref 3.6–11.2)

## 2023-09-15 LAB — AST: AST (SGOT): 20 U/L (ref <=34)

## 2023-09-15 LAB — C-REACTIVE PROTEIN: C-REACTIVE PROTEIN: <5 mg/L (ref <=10.0)

## 2023-09-15 LAB — ALT: ALT (SGPT): 19 U/L (ref 10–49)

## 2023-09-15 MED ADMIN — inFLIXimab-axxq (AVSOLA) 5 mg/kg = 400 mg in sodium chloride (NS) 100 mL IVPB: 5 mg/kg | INTRAVENOUS | @ 14:00:00 | Stop: 2023-09-15

## 2023-09-15 NOTE — Unmapped (Signed)
 Patient presents for accelerated Avsola (infliximab-axxq) infusion.  In no acute distress.  Vitals stable.  Reports no new medical issues or S/S of infection.  IV started to right AC.  See MAR for premeds.    1022 Avsola (infliximab-axxq) 400 mg started to infuse as follows:     40 mL/hour for 15 minutes then increase to 180 mL/hr or until bag is empty      1105 Avsola (infliximab-axxq) infusion complete.  PIV flushed with NS.  Vitals stable.  Patient without any s/s of adverse reaction.    IV d/c'd.  Patient discharged from Infusion Center.

## 2023-11-05 DIAGNOSIS — K50914 Crohn's disease, unspecified, with abscess: Principal | ICD-10-CM

## 2023-11-09 NOTE — Unmapped (Signed)
 FOLLOW UP CONSULT NOTE - INFLAMMATORY BOWEL DISEASE  11/10/2023    Demographics:  Raymond Wright is a 68 y.o. year old male    Diagnosis:  Crohn's Disease  Disease onset (yr):  2011  Age at onset:  > 89yr old (A3)  Location:  Ileal (L1)  Behavior:  Nonstricturing,nonpenetrating (B1)  Current Tight Control Scenario:   Maintenance = Biologic    Referring physician:   Pcp, None Per Patient  [address]  [place],  [place] [address]          HPI / NOTE :     Interval Events:   1.  Last visit with me Nov 2024 - was doing well, we continued Remicade  monotherapy at that time. We did decrease nexium  from 40mg  BID to 40mg  once daily.   2.  I reviewed labs from 09/15/2023 - CRP < 5, AST/ALT normal. CBC unremarkable. Hgb 14.4, MCV 95. Plt 303.     HPI:  History of Present Illness  Raymond Wright is a 68 year old male with Crohn's disease who presents for follow-up of gastrointestinal symptoms.    He experiences occasional heartburn and reflux symptoms, particularly at night, associated with dietary triggers such as spicy foods. He takes Nexium  once daily, reduced from twice daily, and occasionally takes an extra dose if he anticipates eating something spicy. He experiences breakthrough heartburn approximately once every two to three months. No frequent heartburn or significant dietary restrictions.    He describes intermittent bowel issues, including episodes of diarrhea or 'blowouts' after consuming certain foods, such as a market salad with chicken filet or meals with red pepper flakes. These episodes are not frequent but are bothersome when they occur and resolve after a few occurrences. He is concerned about maintaining his weight and mentions difficulty with belly fat despite being active. No major changes in bowel habits.    He reports feeling sluggish and experiencing intermittent nausea, sometimes associated with positional changes, such as getting up from the ground. He also mentions occasional vertigo. 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 He is raising a fourteen-year-old and has a new dog.    He describes irritation and itching in the perianal area, attributed to frequent cleaning due to his bowel habits. The skin becomes sore and irritated, likened to 'poison ivy'.    He mentions a history of benign prostatic hyperplasia and expresses concern about prostate cancer due to cases in his community.    Abdominal pain (0-10):  None  BM per day: 1x/day on most days (has episodes once very few months with 3-4BM in the morning)  Consistency: soft, formed  % of stools have blood: 0%  Nocturnal BM: none  Urgency:  none  Weight change in past 6 mo: stable  Smoking: yes, 4-5 cigarettes per day  NSAIDS: full dose aspirin     Review of Systems:   Review of systems positive for: negative except as above.   Otherwise, the balance of 10 systems is negative.          IBD HISTORY:     Year of disease onset:  2011    Brief IBD Disease Course:    - 2011 - onset of abdominal pain and diarrhea.  Had MRI locally - told thickening of the bowel and then had a colonoscopy and given dx of ileal Crohn's disease.  Treated with Entocort.   - 2014 - September, hospitalized  with severe diarrhea, was treated with Prednisone  and then Humira.  Humira stopped in 2015 due to joint symptoms and (+)ANA. Then treated with Entocort + MTX and doing well since then.   - 2016 - continued on MTX 25mg  / week  - 2017 - self-discontinued MTX, no GI follow up  - 2019 - re-established GI care with us  at [facility].  Restarted methotrexate .    - 2020 - February - started Stelara .  Primary non-response  - 2021 - EGD / Colonoscopy 01/2020 - mild ileal disease.  Good response to Entocort, started Vedolizumab  02/2020.    - 2022 - disease flare on Entyvio , 6+ BM/day, changed to Infliximab  + methotrexate  for joint pain  - 2023 -  patient self discontinued methotrexate  in 2023  - 2024 - On infliximab  monotherapy. Subacute worsening of diarrhea - dx with C.diff and improved with PO Vancomycin .     Endoscopy:      - Colonoscopy 10/14/13 - mild ileal Crohn's disease with aphthae, Two 3-54mm polyps in rectosigmoid colon (Path = tubular adenoma), otherwise normal colon.   - Colonoscopy 04/12/18 - poor prep - moderate ileitis for at least 10cm. Otherwise normal colon.    - EGD 02/13/2020 - enlarged gastric folds (bx - benign), otherwise normal esophagus, stomach, duodenum.   - Colonoscopy 02/13/2020 - 4mm descending colon polyp.  10mm ascending colon.  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  Crohn's disease with moderate ileitis for at least 10cm - similar to Jan 2020 colonoscopy. SES-CD = 4.   PATH = ileum - mildly active chronic enteritis.  No granulomas.  Polyps - sessile serrated (ascending), adenoma (descending).   - EGD 12/24/2022 - normal esophagus, enlarged gastric folds, mild stenosis and ulceration at the pylorus. Mild erythematous duodenopathy.    PATH = Stomach (body) - gastric fundic gland polyp and PPI effect.  Stomach (antrum) - reactive gastropathy, No H.pylori. Duodenum - normal duodenal mucosa.   - Colonoscopy 12/24/2022 - internal hemorrhoids.  4mm sigmoid polyp (path = lymphoid aggregate).  Otherwise normal colon.  Normal terminal ileum for 30cm and no stenosis at ileocecal valve.    Imaging:    - CT A/P 10/22/13 - normal bowel with no signs of active inflammatino.   - CT-E 03/24/18 - inflammation and thickening of terminal ileum consistent with Crohn's flare. Bilateral renal stones.   - CT-E 11/26/2022 - no acute or active Crohn's colitis, persistent diffuse gastric wall thickening (similar to prior), diffuse thickening of the pylorus and moderate distension of the stomach. Short segment focal wall thickening of the sigmoid for 12cm.     Prior IBD medications (type, dose, duration, response):  []  5-ASAs  x Oral corticosteroids - Prednisone , Entocort  []  Intravenous corticosteroids  []  Antibiotics  []  Thiopurines  x Methotrexate  - started April 2015.  Re-challenge 2019 - no improvement  x Anti-TNF therapies - Humira 2014 - then developed joint pains and (+)ANA so it was stopped.  Remicade  08/17/2020.   x Anti-Interleukin therapies - Stelara  07/2018 - 01/2020 - primary non-response.   x Anti-Integrin therapies - Vedolizumab  02/2020 - Feb 2022 - primary non-response  []  Cyclosporine  []  Clinical trial medication  []  Other (Please specify):    Extraintestinal manifestations:   -joint pains affecting: yes but chronic - suspect osteoarthritis  -eye: n  -skin: n  -oral ulcers :  n  -blood clots: n  -PSC: n  -other: n          Past Medical History:   Past  medical history:   Past Medical History:   Diagnosis Date    AC (acromioclavicular) joint bone spurs     in back    Bulging discs     Crohn's disease       Degenerative disc disease     GERD (gastroesophageal reflux disease)     Hypertension     Stroke   2014    topical numbness on right side     Past surgical history:   Past Surgical History:   Procedure Laterality Date    CERVICAL FUSION  2016    KNEE ARTHROSCOPY Right     PR COLONOSCOPY W/BIOPSY SINGLE/MULTIPLE N/A 04/12/2018    Procedure: COLONOSCOPY, FLEXIBLE, PROXIMAL TO SPLENIC FLEXURE; WITH BIOPSY, SINGLE OR MULTIPLE;  Surgeon: Rana Burr, MD;  Location: [facility];  Service: Gastroenterology    PR COLONOSCOPY W/BIOPSY SINGLE/MULTIPLE Left 02/13/2020    Procedure: COLONOSCOPY, FLEXIBLE, PROXIMAL TO SPLENIC FLEXURE; WITH BIOPSY, SINGLE OR MULTIPLE;  Surgeon: Rana Burr, MD;  Location: [facility];  Service: Gastroenterology    PR COLSC FLX W/RMVL OF TUMOR POLYP LESION SNARE TQ N/A 10/14/2013    Procedure: COLONOSCOPY FLEX; W/REMOV TUMOR/LES BY SNARE;  Surgeon: Lella Putt, MD;  Location: [facility];  Service: Gastroenterology    PR COLSC FLX W/RMVL OF TUMOR POLYP LESION SNARE TQ Left 02/13/2020    Procedure: COLONOSCOPY FLEX; W/REMOV TUMOR/LES BY SNARE; Surgeon: Rana Burr, MD;  Location: [facility];  Service: Gastroenterology    PR COLSC FLX W/RMVL OF TUMOR POLYP LESION SNARE TQ Left 12/24/2022    Procedure: COLONOSCOPY FLEX; W/REMOV TUMOR/LES BY SNARE;  Surgeon: Dois Freeze, MD;  Location: [facility];  Service: Gastroenterology    PR UPPER GI ENDOSCOPY,BIOPSY Left 02/13/2020    Procedure: UGI ENDOSCOPY; WITH BIOPSY, SINGLE OR MULTIPLE;  Surgeon: Rana Burr, MD;  Location: [facility];  Service: Gastroenterology    PR UPPER GI ENDOSCOPY,BIOPSY Left 12/24/2022    Procedure: UGI ENDOSCOPY; WITH BIOPSY, SINGLE OR MULTIPLE;  Surgeon: Dois Freeze, MD;  Location: [facility];  Service: Gastroenterology     Family history:   Family History   Problem Relation Age of Onset    Cancer Mother         breast    Crohn's disease Neg Hx     Ulcerative colitis Neg Hx     Colorectal Cancer Neg Hx      Social history:   Social History     Socioeconomic History    Marital status: Married     Spouse name: None    Number of children: None    Years of education: None    Highest education level: None   Tobacco Use    Smoking status: Every Day     Current packs/day: 0.25     Average packs/day: 0.3 packs/day for 14.4 years (3.6 ttl pk-yrs)     Types: Cigarettes     Start date: 06/2009    Smokeless tobacco: Never   Vaping Use    Vaping status: Never Used   Substance and Sexual Activity    Alcohol use: No     Comment: heavy drinker in the past, quit 10 years     Drug use: No           Allergies:     Allergies   Allergen Reactions    Potassium Clavulanate      Other reaction(s): rash  Medications:     Current Outpatient Medications   Medication Sig Dispense Refill    aspirin  325 MG tablet Take 1 tablet (325 mg total) by mouth daily.      baclofen (LIORESAL) 10 MG tablet       BACLOFEN ORAL Take 5 mg by mouth nightly.      esomeprazole  (NEXIUM ) 40 MG capsule Take 1 capsule (40 mg total) by mouth two (2) times a day. 60 capsule 1    esomeprazole  (NEXIUM ) 40 MG capsule Take 1 capsule (40 mg total) by mouth daily before breakfast. 90 capsule 3    gabapentin (NEURONTIN) 300 MG capsule Take 1 capsule (300 mg total) by mouth Three (3) times a day.      HYDROcodone-acetaminophen (NORCO) 7.5-325 mg per tablet Take 3 tablets by mouth in the morning.      infliximab  (REMICADE  IV) Remicade       lisinopril (PRINIVIL,ZESTRIL) 20 MG tablet Take 0.5 tablets (10 mg total) by mouth every other day. prn      rosuvastatin (CRESTOR) 20 MG tablet Take 1 tablet (20 mg total) by mouth in the morning.      tamsulosin (FLOMAX) 0.4 mg capsule       cefdinir (OMNICEF) 300 MG capsule Take 1 capsule (300 mg total) by mouth two (2) times a day. (Patient not taking: Reported on 11/10/2023)      cholestyramine  (QUESTRAN ) 4 gram packet Take 1 packet by mouth two (2) times a day. Please avoid taking around the same time as other medications 60 each 2    famotidine  (PEPCID ) 20 MG tablet Take 1 tablet (20 mg total) by mouth at bedtime. 30 tablet 5     No current facility-administered medications for this visit.           Physical Exam:   BP 152/88 (BP Site: L Arm, BP Position: Sitting)  - Pulse 88  - Temp 36.3 ??C (97.3 ??F)  - Ht 175.3 cm (5' 9)  - Wt 78.9 kg (174 lb)  - BMI 25.70 kg/m??     GEN: no apparent distress, appears comfortable on exam  HEENT: OP clear with no erythema, lesions, exudate, mucous membranes moist  NECK: Supple, no lymphadenopathy  LUNGS: CTAB, no wheezes, rales, or rhonchi  CV: S1/S2, RRR, no murmurs  ABD: Soft, nontender, no rebound/guarding, nondistended, normoactive bowel sounds, no appreciable organomegaly  Extremities: no cyanosis, clubbing or edema, normal gait  Psych: affect appropriate, A&O x3  SKIN: no visible lesions on face, neck, arms, abdomen  PERIANAL exam (external exam only):  erythematous, excoriated perianal skin, 2 small skin tags, no perianal fistula, abscess or fluctuance.           Labs, Data & Indices:     Lab Review:   Lab Results   Component Value Date    WBC 5.9 09/15/2023    WBC 6.4 08/29/2014    RBC 4.31 09/15/2023    RBC 4.25 (L) 08/29/2014    HGB 14.4 09/15/2023    HGB 14.2 08/29/2014     Lab Results   Component Value Date    AST 20 09/15/2023    AST 18 (L) 08/29/2014    ALT 19 09/15/2023    ALT 27 08/29/2014    BUN 14 03/13/2020    BUN 19 10/04/2013    Creatinine Whole Blood, POC 1.1 02/24/2020    Creatinine 0.86 03/13/2020    CO2 26.6 03/13/2020    CO2 25 10/04/2013    Albumin 4.0  03/13/2020    Albumin 4.3 08/29/2014    Calcium 10.1 03/13/2020    Calcium 9.8 10/04/2013     Lab Results   Component Value Date    TSH 1.52 10/04/2013       Lavelle Posey Index for Crohn's Disease   General Well Being: 1 = Slightly below par  Abdominal Pain:  0 = none  Number of Liquid Stools Per day: 1-2 BMs  Abdominal Mass:  0 = None  Number of Extraintestinal Manifestations:  0   1 point each for: 1. Arthritis/arthralgias, 2. Iritis/uveitis, 3. Active perianal dz, 4. Other active fistula, 5. Erythema nodosum or pyoderma, 6. Other    Total HBI Score (0-25):  1     Score:  Remission < 5;  Mild dz 5-7;  Moderate dz 8-16;  Severe > 16  ............................................................................................................................................  ORDERS THIS VISIT:       Diagnosis ICD-10-CM Associated Orders   1. Crohn's disease of small intestine without complication    K50.00       2. High risk medication use  Z79.899       3. Gastroesophageal reflux disease without esophagitis  K21.9               Assessment & Recommendations:   Disease state:  Active Crohn's flare    Raymond Wright is a 68 y.o. male with a hx of ileal Crohn's disease dating back to ~2011.  He was previously on humira but stopped due to worsening joint pains and (+)ANA. He was on MTX from ~2015 - 2017, but he self discontinued this medication since he was feeling well.  He developed inflammatory ileal Crohn's flare in fall 2019. Since then, he has had a poor response to Entocort + methotrexate , Stelara  2020-2021 and worsening symptoms on Vedolizumab .  We started Remicade  (w/ MTX) in Feb 2022 and he has had a good response overall and is in clinical + endoscopic remission at this time. We will continue current therapy with routine lab monitoring as detailed below.     His GERD is doing fairly well on Nexium  40mg  once daily with occasional breakthrough symptoms. I suggested cotninuing Nexium  once daily and adding Pepcid  on demand for these intermittent breakthrough reflux episodes.     PLAN:   1.  Continue remicade  infusions as planned  2.  Check full panel of labs today including vit B12, Vit D, iron and remicade  levels  3.  Continue nexium  once daily.  I would recommend using Pepcid  40mg  as needed before triggering meals or right after.    4. For perianal skin discomfort/excoriations, use gentle cleaning with water, minimal scrubbing and a protective barrier cream (such as Boudreaux's).  If things are not improving, let me know and we can try an empiric anti-fungal cream.   5.  Follow up with me in 6 months.   6.  Pneumonia booster at next visit.    IBD health maintenance:  Influenza vaccine: 05/12/2023  Pneumonia vaccine:  Prevnar 08/10/15, Pneumovax ~2012, Pneumovax 03/12/18  COVID19 Vaccine:  2 shots spring 2021, recommend booster  Hepatitis B: sAg neg 03/12/18  TB testing: Quantiferon neg 03/12/18  Chickenpox/Shingles history:  Had chickenpox, Shingrix today 2/2 today  Bone denistometry:   Derm appointment:  Last colonoscopy:  2021  --------------------------------------------  Author: Rana Burr, MD 11/10/2023 10:33 AM    Rana Burr, MD  Associate Professor of Medicine  Division of Gastroenterology & Hepatology  [employer]  [facility]  ============================================

## 2023-11-10 ENCOUNTER — Encounter: Admit: 2023-11-10 | Discharge: 2023-11-10 | Payer: MEDICARE

## 2023-11-10 ENCOUNTER — Ambulatory Visit: Admit: 2023-11-10 | Discharge: 2023-11-10 | Payer: MEDICARE

## 2023-11-10 DIAGNOSIS — K219 Gastro-esophageal reflux disease without esophagitis: Principal | ICD-10-CM

## 2023-11-10 DIAGNOSIS — Z79899 Other long term (current) drug therapy: Principal | ICD-10-CM

## 2023-11-10 DIAGNOSIS — K5000 Crohn's disease of small intestine without complications: Principal | ICD-10-CM

## 2023-11-10 LAB — CBC W/ AUTO DIFF
BASOPHILS ABSOLUTE COUNT: 0.1 10*9/L (ref 0.0–0.1)
BASOPHILS RELATIVE PERCENT: 1.2 %
EOSINOPHILS ABSOLUTE COUNT: 0.6 10*9/L — ABNORMAL HIGH (ref 0.0–0.5)
EOSINOPHILS RELATIVE PERCENT: 8.4 %
HEMATOCRIT: 40.5 % (ref 39.0–48.0)
HEMOGLOBIN: 14 g/dL (ref 12.9–16.5)
LYMPHOCYTES ABSOLUTE COUNT: 1.5 10*9/L (ref 1.1–3.6)
LYMPHOCYTES RELATIVE PERCENT: 23 %
MEAN CORPUSCULAR HEMOGLOBIN CONC: 34.5 g/dL (ref 32.0–36.0)
MEAN CORPUSCULAR HEMOGLOBIN: 32.7 pg — ABNORMAL HIGH (ref 25.9–32.4)
MEAN CORPUSCULAR VOLUME: 94.8 fL (ref 77.6–95.7)
MEAN PLATELET VOLUME: 7.4 fL (ref 6.8–10.7)
MONOCYTES ABSOLUTE COUNT: 0.4 10*9/L (ref 0.3–0.8)
MONOCYTES RELATIVE PERCENT: 5.7 %
NEUTROPHILS ABSOLUTE COUNT: 4.1 10*9/L (ref 1.8–7.8)
NEUTROPHILS RELATIVE PERCENT: 61.7 %
PLATELET COUNT: 262 10*9/L (ref 150–450)
RED BLOOD CELL COUNT: 4.27 10*12/L (ref 4.26–5.60)
RED CELL DISTRIBUTION WIDTH: 12.6 % (ref 12.2–15.2)
WBC ADJUSTED: 6.6 10*9/L (ref 3.6–11.2)

## 2023-11-10 LAB — VITAMIN B12: VITAMIN B-12: 350 pg/mL (ref 211–911)

## 2023-11-10 LAB — COMPREHENSIVE METABOLIC PANEL
ALBUMIN: 4.1 g/dL (ref 3.4–5.0)
ALKALINE PHOSPHATASE: 71 U/L (ref 46–116)
ALT (SGPT): 18 U/L (ref 10–49)
ANION GAP: 10 mmol/L (ref 5–14)
AST (SGOT): 19 U/L (ref <=34)
BILIRUBIN TOTAL: 0.4 mg/dL (ref 0.3–1.2)
BLOOD UREA NITROGEN: 14 mg/dL (ref 9–23)
BUN / CREAT RATIO: 17
CALCIUM: 9.6 mg/dL (ref 8.7–10.4)
CHLORIDE: 106 mmol/L (ref 98–107)
CO2: 24.4 mmol/L (ref 20.0–31.0)
CREATININE: 0.83 mg/dL (ref 0.73–1.18)
EGFR CKD-EPI (2021) MALE: >90 mL/min/1.73m2 (ref >=60)
GLUCOSE RANDOM: 108 mg/dL (ref 70–179)
POTASSIUM: 3.9 mmol/L (ref 3.4–4.8)
PROTEIN TOTAL: 7.4 g/dL (ref 5.7–8.2)
SODIUM: 140 mmol/L (ref 135–145)

## 2023-11-10 LAB — IRON & TIBC
IRON SATURATION: 19 % — ABNORMAL LOW (ref 20–55)
IRON: 57 ug/dL — ABNORMAL LOW (ref 65–175)
TOTAL IRON BINDING CAPACITY: 301 ug/dL (ref 250–425)

## 2023-11-10 LAB — C-REACTIVE PROTEIN: C-REACTIVE PROTEIN: <5 mg/L (ref <=10.0)

## 2023-11-10 LAB — FERRITIN: FERRITIN: 40.2 ng/mL (ref 10.5–307.3)

## 2023-11-10 MED ADMIN — inFLIXimab-axxq (AVSOLA) 5 mg/kg = 400 mg in sodium chloride (NS) 250 mL IVPB: 5 mg/kg | INTRAVENOUS | @ 15:00:00 | Stop: 2023-11-10

## 2023-11-10 NOTE — Unmapped (Addendum)
 Raymond Wright it was a pleasure seeing you today.  Here is a summary from today's visit:     1.  Continue remicade  infusions as planned  2.  Check full panel of labs today including vit B12, Vit D, iron and remicade  levels  3.  Continue nexium  once daily.  I would recommend using Pepcid  40mg  as needed before triggering meals or right after.    4. For skin discomfort, use gentle cleaning with water, minimal scrubbing and a protective barrier cream (such as Boudreaux's).  If things are not improving, let me know and we can try an empiric anti-fungal cream.   5.  Follow up with me in 6 months. If any trouble or symptoms, do not hesitate to call.     Rana Burr, MD  Associate Professor of Medicine  Division of Gastroenterology & Hepatology  [employer]  [facility]        EXPECTATIONS FOR PATIENT FOLLOW UP AND COMMUNICATION:  -- Follow up Appointments:  Crohn's disease and Ulcerative colitis are serious chronic inflammatory diseases which require close monitoring.  I expect to see patients for a follow up visits at least every 6 months (or more often if you are having a flare, starting new therapy, etc).  In select cases, patients who are only on aminosalicylates / mesalamine, may be seen once per year for follow up.  If you are not able to come for follow up appointments we may not be able to refill your medications or continue caring for you.   -- Appointment Type: While we are continuing to offer video appointments in select cases (stable patients who are not in a flare and without new/active symptoms) during the COVID19 pandemic, I expect to see patients in person at least once per year.   -- Communication:  if you have any questions or concerns, you can communicate with us  via phone (contact information below) or via myChart.  Note that phone messages are given higher priority and triaged first. For urgent issues, please contact us  via phone.      IBD NURSE COORDINATOR CONTACT - Cornel Diesel, RN     Phone: [phone] (direct line)      Fax: [phone]  * For urgent medical concerns after hours or on weekends and holidays, call [phone] and ask to speak to the GI Fellow on call.    * If you have a GI medical question or GI symptoms and would like to speak to your provider's healthcare team, please contact Cornel Diesel, RN (contact information above) OR you can send the GI healthcare team a message through MyChart at [email or web address]    [facility] MESSAGES  -- MyChart messages and advice requests are now going to be sent to our clinical care team. A team member will respond to the message or send it to your provider if needed.   -- The message will typically be addressed within 3-4 business days if it is an issue that can be handled by MyChart.   -- For multiple questions, complex concerns, and/or if you have not been by seen by your provider in the recommended follow-up period you may be asked to schedule a clinic visit.   -- MyChart messages are NOT read after hours or on weekends. MyChart is NOT meant for urgent issues or emergencies. For emergencies call 911 or go to the nearest emergency department.     APPOINTMENT SCHEDULING FOR GI CLINIC AND GI PROCEDURES:  RADIOLOGY - to schedule imaging  ordered, please call [phone]  GI PROCEDURES         [phone] option 2   GI MEDICINE CLINIC  [phone] option 1   *To schedule, reschedule, or cancel your GI appointment, please call [phone]. If you are unable to come to an appointment, please notify us  as soon as possible, preferably 24 hours in advance. Doing so may allow other patients with urgent needs to be scheduled in a cancelled appointment slot.     TEST RESULTS   If you have a MyChart account, your new results and a provider message will be sent to you through your MyChart account at [email or web address]. For results that require follow-up, a member of your healthcare team will also contact you.    PRESCRIPTION REFILL REQUESTS  To request prescription refills, please contact your pharmacy or send your healthcare team a message through your MyChart account at [email or web address]  RECORD REQUESTS  For questions related to medical records, please call Medical Records Release of Information at [phone]  FINANCIAL NAVIGATOR   For billing and other financial questions/needs - please call [phone]. If you need to leave a message, please be sure to leave your full name, date of birth or Medical record number, best call back # and reason for call.    For educational material and resources:  [email or web address]    ================================================================

## 2023-11-10 NOTE — Unmapped (Signed)
 Patient presents for accelerated Avsola  (infliximab -axxq) infusion.  In no acute distress.  Vitals stable.  Reports no new medical issues or S/S of infection.  IV started to right hand, brisk blood return but unable to get enough blood for specimen. Line flushed, he tolerated well. Labs drawn peripheral to left [facility] with 23g butterfly, he tolerated well.  See MAR for premeds.  Future appointments made x 1     1111 Avsola  (infliximab -axxq) 400 mg started to infuse as follows:     100 ml/hr x 15 min then 300 ml/hr for remainder of infusion.    1215 Avsola  (infliximab -axxq) infusion complete.  PIV flushed with NS.  Vitals stable.  Patient without any s/s of adverse reaction. IV d/c'd.  Patient discharged from Infusion Center.

## 2023-11-13 LAB — VITAMIN D 25 HYDROXY: VITAMIN D, TOTAL (25OH): 45.8 ng/mL (ref 20.0–80.0)

## 2023-11-14 LAB — INFLIXIMAB/INFLIX ANTIBODY: INFLIXIMAB: 8.7 ug/mL

## 2023-12-21 DIAGNOSIS — K50914 Crohn's disease, unspecified, with abscess: Principal | ICD-10-CM

## 2024-01-05 ENCOUNTER — Encounter: Admit: 2024-01-05 | Discharge: 2024-01-05 | Payer: MEDICARE

## 2024-01-05 LAB — CBC W/ AUTO DIFF
BASOPHILS ABSOLUTE COUNT: 0.1 10*9/L (ref 0.0–0.1)
BASOPHILS RELATIVE PERCENT: 1.5 %
EOSINOPHILS ABSOLUTE COUNT: 0.6 10*9/L — ABNORMAL HIGH (ref 0.0–0.5)
EOSINOPHILS RELATIVE PERCENT: 9.8 %
HEMATOCRIT: 40.3 % (ref 39.0–48.0)
HEMOGLOBIN: 13.8 g/dL (ref 12.9–16.5)
LYMPHOCYTES ABSOLUTE COUNT: 1.4 10*9/L (ref 1.1–3.6)
LYMPHOCYTES RELATIVE PERCENT: 22.6 %
MEAN CORPUSCULAR HEMOGLOBIN CONC: 34.3 g/dL (ref 32.0–36.0)
MEAN CORPUSCULAR HEMOGLOBIN: 32.3 pg (ref 25.9–32.4)
MEAN CORPUSCULAR VOLUME: 94.2 fL (ref 77.6–95.7)
MEAN PLATELET VOLUME: 8.1 fL (ref 6.8–10.7)
MONOCYTES ABSOLUTE COUNT: 0.4 10*9/L (ref 0.3–0.8)
MONOCYTES RELATIVE PERCENT: 5.6 %
NEUTROPHILS ABSOLUTE COUNT: 3.8 10*9/L (ref 1.8–7.8)
NEUTROPHILS RELATIVE PERCENT: 60.5 %
PLATELET COUNT: 252 10*9/L (ref 150–450)
RED BLOOD CELL COUNT: 4.28 10*12/L (ref 4.26–5.60)
RED CELL DISTRIBUTION WIDTH: 12.8 % (ref 12.2–15.2)
WBC ADJUSTED: 6.3 10*9/L (ref 3.6–11.2)

## 2024-01-05 LAB — C-REACTIVE PROTEIN: C-REACTIVE PROTEIN: <5 mg/L (ref <=10.0)

## 2024-01-05 LAB — ALT: ALT (SGPT): 21 U/L (ref 10–49)

## 2024-01-05 LAB — AST: AST (SGOT): 22 U/L (ref <=34)

## 2024-01-05 MED ADMIN — inFLIXimab-axxq (AVSOLA) 5 mg/kg = 400 mg in sodium chloride (NS) 250 mL IVPB: 5 mg/kg | INTRAVENOUS | @ 15:00:00 | Stop: 2024-01-05

## 2024-01-05 NOTE — Unmapped (Signed)
 1000 Patient in today for AVSOLA  infusion. Patient has no s/s of infection. No issues from the last infusion.  1031 AVSOLA  started ,patient instructed to use call bell /call nurse for any s/s of unsual symptoms during infusion. Patient educated on possible s/s of reaction,such as chestpain ,itching,shortness of breath lightheadedness and any kind of discomfort. Patient verbalized understanding.  1135 Infusion completed and well tolerated.  1345 AVSOLA  (Infliximab  axxq) 400 mg/250 NS infused over 2hrs per protocol . Pt alert and oriented, offered no complaints during infusion. IV flushed with 10ml NS post infusion. Pt tolerated infusion well.  Infliximab  1hr protocol  Start of infusion 100ml x                             x till complete.

## 2024-01-18 DIAGNOSIS — Z1211 Encounter for screening for malignant neoplasm of colon: Principal | ICD-10-CM

## 2024-02-14 DIAGNOSIS — K50914 Crohn's disease, unspecified, with abscess: Principal | ICD-10-CM

## 2024-03-01 ENCOUNTER — Ambulatory Visit: Admit: 2024-03-01 | Discharge: 2024-03-02 | Payer: MEDICARE

## 2024-03-01 ENCOUNTER — Encounter: Admit: 2024-03-01 | Discharge: 2024-03-02 | Payer: MEDICARE

## 2024-03-01 DIAGNOSIS — K50918 Crohn's disease, unspecified, with other complication: Principal | ICD-10-CM

## 2024-03-01 DIAGNOSIS — K5000 Crohn's disease of small intestine without complications: Principal | ICD-10-CM

## 2024-03-01 LAB — C-REACTIVE PROTEIN: C-REACTIVE PROTEIN: <5 mg/L (ref <=10.0)

## 2024-03-01 LAB — CBC W/ AUTO DIFF
BASOPHILS ABSOLUTE COUNT: 0 10*9/L (ref 0.0–0.1)
BASOPHILS RELATIVE PERCENT: 0.1 %
EOSINOPHILS ABSOLUTE COUNT: 0.6 10*9/L — ABNORMAL HIGH (ref 0.0–0.5)
EOSINOPHILS RELATIVE PERCENT: 9.5 %
HEMATOCRIT: 38.4 % — ABNORMAL LOW (ref 39.0–48.0)
HEMOGLOBIN: 13.4 g/dL (ref 12.9–16.5)
LYMPHOCYTES ABSOLUTE COUNT: 1.5 10*9/L (ref 1.1–3.6)
LYMPHOCYTES RELATIVE PERCENT: 24.3 %
MEAN CORPUSCULAR HEMOGLOBIN CONC: 34.9 g/dL (ref 32.0–36.0)
MEAN CORPUSCULAR HEMOGLOBIN: 33.1 pg — ABNORMAL HIGH (ref 25.9–32.4)
MEAN CORPUSCULAR VOLUME: 94.7 fL (ref 77.6–95.7)
MEAN PLATELET VOLUME: 7.6 fL (ref 6.8–10.7)
MONOCYTES ABSOLUTE COUNT: 0.5 10*9/L (ref 0.3–0.8)
MONOCYTES RELATIVE PERCENT: 7.8 %
NEUTROPHILS ABSOLUTE COUNT: 3.6 10*9/L (ref 1.8–7.8)
NEUTROPHILS RELATIVE PERCENT: 58.3 %
PLATELET COUNT: 259 10*9/L (ref 150–450)
RED BLOOD CELL COUNT: 4.05 10*12/L — ABNORMAL LOW (ref 4.26–5.60)
RED CELL DISTRIBUTION WIDTH: 13.7 % (ref 12.2–15.2)
WBC ADJUSTED: 6.1 10*9/L (ref 3.6–11.2)

## 2024-03-01 LAB — AST: AST (SGOT): 30 U/L (ref <=34)

## 2024-03-01 LAB — ALT: ALT (SGPT): 24 U/L (ref 10–49)

## 2024-03-01 MED ADMIN — inFLIXimab-axxq (AVSOLA) 5 mg/kg = 400 mg in sodium chloride (NS) 250 mL rapid infusion: 5 mg/kg | INTRAVENOUS | @ 14:00:00 | Stop: 2024-03-01

## 2024-03-01 NOTE — Unmapped (Signed)
 Patient presents for accelerated Avsola  (infliximab -axxq) infusion.  In no acute distress.  Vitals stable.  Pt reports an increase in number of soft stools per day and constant right lower abd pain. This has been ongoing since his last visit with Dr. Darrick, message sent to Duwaine Balloon, RN. Will speak with Dr. Darrick. Pt to pick up stool sample kit from laboratory after infusion today.  No S/S of infection, no ABX, no changes in meds or allergies.  IV started to right AC.      0959 Avsola  (infliximab -axxq) 400 mg started to infuse as follows:     100 ml/hr x 15 min then 300 ml/hr for remainder of infusion.    1100 Avsola  (infliximab -axxq) infusion complete.  PIV flushed with NS.  Vitals stable.  Patient without any s/s of adverse reaction. IV d/c'd.  Patient discharged from Infusion Center.

## 2024-03-01 NOTE — Unmapped (Signed)
 TP updated to infliximab  accelerated rate.

## 2024-03-01 NOTE — Unmapped (Signed)
 Reason For Call:  Received message from infusion nurse about pt having increased BM's since last appointment.     Assessment:   Pt reports symptoms started about two weeks after last visit in May. He reports 6 BM/day is average, loose, most of the time it is normal color but had one episode of dark tarry. Reports RLQ abdominal pain 2/10, worsening prior to BM. Reports intermittent nausea but has had that ongoing, no new meds, no abx recently.     Instructions Provided:  Per Dr. Darrick, will have pt obtain a calpro, c. Diff and GI pathogen panel.     Follow Up:  - Patient verbalized understanding and agreement with plan    Workup Time:   Time spent 15 mins

## 2024-04-12 DIAGNOSIS — K50914 Crohn's disease, unspecified, with abscess: Principal | ICD-10-CM

## 2024-04-26 ENCOUNTER — Encounter: Admit: 2024-04-26 | Discharge: 2024-04-26 | Payer: MEDICARE

## 2024-04-26 ENCOUNTER — Ambulatory Visit: Admit: 2024-04-26 | Discharge: 2024-04-26 | Payer: MEDICARE

## 2024-04-26 DIAGNOSIS — K5000 Crohn's disease of small intestine without complications: Principal | ICD-10-CM

## 2024-04-26 DIAGNOSIS — K50918 Crohn's disease, unspecified, with other complication: Principal | ICD-10-CM

## 2024-04-26 LAB — CBC W/ AUTO DIFF
BASOPHILS ABSOLUTE COUNT: 0 10*9/L (ref 0.0–0.1)
BASOPHILS RELATIVE PERCENT: 0.4 %
EOSINOPHILS ABSOLUTE COUNT: 0.6 10*9/L — ABNORMAL HIGH (ref 0.0–0.5)
EOSINOPHILS RELATIVE PERCENT: 8.7 %
HEMATOCRIT: 41.1 % (ref 39.0–48.0)
HEMOGLOBIN: 13.8 g/dL (ref 12.9–16.5)
LYMPHOCYTES ABSOLUTE COUNT: 1.6 10*9/L (ref 1.1–3.6)
LYMPHOCYTES RELATIVE PERCENT: 25.6 %
MEAN CORPUSCULAR HEMOGLOBIN CONC: 33.6 g/dL (ref 32.0–36.0)
MEAN CORPUSCULAR HEMOGLOBIN: 31.9 pg (ref 25.9–32.4)
MEAN CORPUSCULAR VOLUME: 95.1 fL (ref 77.6–95.7)
MEAN PLATELET VOLUME: 8.3 fL (ref 6.8–10.7)
MONOCYTES ABSOLUTE COUNT: 0.4 10*9/L (ref 0.3–0.8)
MONOCYTES RELATIVE PERCENT: 6.3 %
NEUTROPHILS ABSOLUTE COUNT: 3.7 10*9/L (ref 1.8–7.8)
NEUTROPHILS RELATIVE PERCENT: 59 %
PLATELET COUNT: 246 10*9/L (ref 150–450)
RED BLOOD CELL COUNT: 4.32 10*12/L (ref 4.26–5.60)
RED CELL DISTRIBUTION WIDTH: 13.4 % (ref 12.2–15.2)
WBC ADJUSTED: 6.3 10*9/L (ref 3.6–11.2)

## 2024-04-26 LAB — C-REACTIVE PROTEIN: C-REACTIVE PROTEIN: <5 mg/L (ref <=10.0)

## 2024-04-26 LAB — ALT: ALT (SGPT): 17 U/L (ref 10–49)

## 2024-04-26 MED ADMIN — inFLIXimab-axxq (AVSOLA) 5 mg/kg = 400 mg in sodium chloride (NS) 250 mL rapid infusion: 5 mg/kg | INTRAVENOUS | @ 14:00:00 | Stop: 2024-04-26

## 2024-04-26 NOTE — Progress Notes (Signed)
 Patient presents for accelerated Avsola  (infliximab -axxq) infusion.  In no acute distress.  Vitals stable.  Reports no new medical issues or S/S of infection.  IV started.  See MAR for premeds.    9040 Avsola  (infliximab -axxq) 400 mg started to infuse as follows:     100 ml/hr x 15 min then 300 ml/hr for remainder of infusion.    1107 Avsola  (infliximab -axxq) infusion complete.  PIV flushed with NS.  Vitals stable.  Patient without any s/s of adverse reaction. IV d/c'd.  Patient discharged from Infusion Center.

## 2024-05-19 DIAGNOSIS — K219 Gastro-esophageal reflux disease without esophagitis: Principal | ICD-10-CM

## 2024-05-19 MED ORDER — ESOMEPRAZOLE MAGNESIUM 40 MG CAPSULE,DELAYED RELEASE
ORAL_CAPSULE | Freq: Every day | ORAL | 1 refills | 90.00000 days | Status: CP
Start: 2024-05-19 — End: ?

## 2024-05-19 NOTE — Telephone Encounter (Signed)
 Encounter for refill request:  Last clinic visit: 11/10/2023  Colonoscopy:     Lab Results   Component Value Date    WBC 6.3 04/26/2024    RBC 4.32 04/26/2024    HGB 13.8 04/26/2024    HCT 41.1 04/26/2024    PLT 246 04/26/2024    ALT 17 04/26/2024    AST 30 03/01/2024    ALKPHOS 71 11/10/2023    CRP <5.0 04/26/2024    CREATININE 0.83 11/10/2023       Nexium  refills authorized x 6 month supply.

## 2024-05-31 ENCOUNTER — Ambulatory Visit
Admit: 2024-05-31 | Discharge: 2024-06-01 | Payer: MEDICARE | Attending: Student in an Organized Health Care Education/Training Program | Primary: Student in an Organized Health Care Education/Training Program

## 2024-05-31 DIAGNOSIS — Z23 Encounter for immunization: Principal | ICD-10-CM

## 2024-05-31 DIAGNOSIS — Z79899 Other long term (current) drug therapy: Principal | ICD-10-CM

## 2024-05-31 DIAGNOSIS — K219 Gastro-esophageal reflux disease without esophagitis: Principal | ICD-10-CM

## 2024-05-31 DIAGNOSIS — K5000 Crohn's disease of small intestine without complications: Principal | ICD-10-CM

## 2024-05-31 LAB — CBC
HEMATOCRIT: 40.2 % (ref 39.0–48.0)
HEMOGLOBIN: 13.9 g/dL (ref 12.9–16.5)
MEAN CORPUSCULAR HEMOGLOBIN CONC: 34.5 g/dL (ref 32.0–36.0)
MEAN CORPUSCULAR HEMOGLOBIN: 32.7 pg — ABNORMAL HIGH (ref 25.9–32.4)
MEAN CORPUSCULAR VOLUME: 94.6 fL (ref 77.6–95.7)
MEAN PLATELET VOLUME: 7.8 fL (ref 6.8–10.7)
PLATELET COUNT: 265 10*9/L (ref 150–450)
RED BLOOD CELL COUNT: 4.25 10*12/L — ABNORMAL LOW (ref 4.26–5.60)
RED CELL DISTRIBUTION WIDTH: 13.3 % (ref 12.2–15.2)
WBC ADJUSTED: 7 10*9/L (ref 3.6–11.2)

## 2024-05-31 LAB — COMPREHENSIVE METABOLIC PANEL
ALBUMIN: 4.1 g/dL (ref 3.4–5.0)
ALKALINE PHOSPHATASE: 84 U/L (ref 46–116)
ALT (SGPT): 14 U/L (ref 10–49)
ANION GAP: 12 mmol/L (ref 5–14)
AST (SGOT): 20 U/L (ref <=34)
BILIRUBIN TOTAL: 0.3 mg/dL (ref 0.3–1.2)
BLOOD UREA NITROGEN: 15 mg/dL (ref 9–23)
BUN / CREAT RATIO: 14
CALCIUM: 9.7 mg/dL (ref 8.7–10.4)
CHLORIDE: 106 mmol/L (ref 98–107)
CO2: 23.7 mmol/L (ref 20.0–31.0)
CREATININE: 1.06 mg/dL (ref 0.73–1.18)
EGFR CKD-EPI (2021) MALE: 77 mL/min/1.73m2 (ref >=60)
GLUCOSE RANDOM: 109 mg/dL — ABNORMAL HIGH (ref 70–99)
POTASSIUM: 3.3 mmol/L — ABNORMAL LOW (ref 3.4–4.8)
PROTEIN TOTAL: 7.3 g/dL (ref 5.7–8.2)
SODIUM: 142 mmol/L (ref 135–145)

## 2024-05-31 LAB — FERRITIN: FERRITIN: 39 ng/mL (ref 30.0–307.3)

## 2024-05-31 LAB — IRON & TIBC
IRON SATURATION: 18 % — ABNORMAL LOW (ref 20–55)
IRON: 53 ug/dL — ABNORMAL LOW (ref 65–175)
TOTAL IRON BINDING CAPACITY: 292 ug/dL (ref 250–425)

## 2024-05-31 LAB — SEDIMENTATION RATE: ERYTHROCYTE SEDIMENTATION RATE: 38 mm/h — ABNORMAL HIGH (ref 0–20)

## 2024-05-31 LAB — C-REACTIVE PROTEIN: C-REACTIVE PROTEIN: 8.6 mg/L (ref <=10.0)

## 2024-05-31 LAB — VITAMIN B12: VITAMIN B-12: 287 pg/mL (ref 211–911)

## 2024-05-31 MED ORDER — BUDESONIDE DR - ER 3 MG CAPSULE,DELAYED,EXTENDED RELEASE
ORAL_CAPSULE | Freq: Every morning | ORAL | 1 refills | 30.00000 days | Status: CP
Start: 2024-05-31 — End: 2025-05-31

## 2024-05-31 NOTE — Progress Notes (Signed)
 FOLLOW UP CONSULT NOTE - INFLAMMATORY BOWEL DISEASE  05/31/2024    Demographics:  Raymond Wright is a 68 y.o. year old male    Diagnosis:  Crohn's Disease  Disease onset (yr):  2011  Age at onset:  > 89yr old (A3)  Location:  Ileal (L1)  Behavior:  Nonstricturing,nonpenetrating (B1)  Current Tight Control Scenario:   Maintenance = Biologic          HPI / NOTE :     Interval Events:   1.  Last visit with me May 2025 - at that time, he was doing fairly well with IFX monotherapy and Nexium  40mg  daily.  I recommended adding pepcid  PRN for breakthrough symptoms. Also had some perianal skin excoriation and we recommend topical barrier creams.   2.  I reviewed labs from 04/26/24, 03/01/24 and 01/05/24.  CRP undetectable. CBC and ALT unremarkable, hgb 13.8, MCV 95.   3.  We did GI pathogen panel and C.diff in October which were normal.     HPI:  History of Present Illness  Raymond Wright is a 68 year old male with Crohn's disease who presents with worsening diarrhea and gastrointestinal symptoms.    Over the past few months, he has experienced a progressive worsening of gastrointestinal symptoms, with bowel movement frequency increasing to 8 to 12 times per day from 3 to 4 times previously. The stools are described as 'sandy, dark stuff' with small, dark brown pieces, accompanied by urgency and occasional nocturnal bowel movements. These symptoms have become embarrassing and are affecting his daily activities, including his role at church.    He experiences nausea almost daily, which is more frequent than before. No abdominal pain or cramping is present, but the frequent bowel movements and associated urgency significantly impact his quality of life. He has lost approximately 5 pounds over the past six months and has difficulty gaining weight.    His symptoms began to worsen around the time of his penultimate Remicade  infusion, approximately two to three months ago. He has been on Remicade  for Crohn's disease, which had been effective for a couple of years after trying other medications like Stelara  and Entyvio  without success.    No blood is seen in his stool, and there is no significant abdominal discomfort. However, he has experienced lightheadedness and brief episodes of double vision recently. He also has a history of arthritis.    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 He sleeps well and has not noticed any mouth sores. He has a history of smoking but currently does not smoke cigarettes.    Abdominal pain (0-10):  None  BM per day: 8-10x/day on most days   Consistency: loose  % of stools have blood: 0%  Nocturnal BM: yes, nocturnal urgency, gas, some stool  Urgency: yes ++  Weight change in past 6 mo: lost 5lb  Smoking: yes, few  NSAIDS: full dose aspirin     Review of Systems:   Review of systems positive for: negative except as above.   Otherwise, the balance of 10 systems is negative.          IBD HISTORY:     Year of disease onset:  2011    Brief IBD Disease Course:    - 2011 - onset of abdominal pain and diarrhea.  Had MRI locally - told thickening of the bowel and then had a colonoscopy  and given dx of ileal Crohn's disease.  Treated with Entocort.   - 2014 - September, hospitalized with severe diarrhea, was treated with Prednisone  and then Humira.  Humira stopped in 2015 due to joint symptoms and (+)ANA. Then treated with Entocort + MTX and doing well since then.   - 2016 - continued on MTX 25mg  / week  - 2017 - self-discontinued MTX, no GI follow up  - 2019 - re-established GI care with us  at [facility].  Restarted methotrexate .    - 2020 - February - started Stelara .  Primary non-response  - 2021 - EGD / Colonoscopy 01/2020 - mild ileal disease.  Good response to Entocort, started Vedolizumab  02/2020.    - 2022 - disease flare on Entyvio , 6+ BM/day, changed to Infliximab  + methotrexate  for joint pain  - 2023 -  patient self discontinued methotrexate  in 2023  - 2024 - On infliximab  monotherapy. Subacute worsening of diarrhea - dx with C.diff and improved with PO Vancomycin .     Endoscopy:      - Colonoscopy 10/14/13 - mild ileal Crohn's disease with aphthae, Two 3-70mm polyps in rectosigmoid colon (Path = tubular adenoma), otherwise normal colon.   - Colonoscopy 04/12/18 - poor prep - moderate ileitis for at least 10cm. Otherwise normal colon.    - EGD 02/13/2020 - enlarged gastric folds (bx - benign), otherwise normal esophagus, stomach, duodenum.   - Colonoscopy 02/13/2020 - 4mm descending colon polyp.  10mm ascending colon.  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  Crohn's disease with moderate ileitis for at least 10cm - similar to Jan 2020 colonoscopy. SES-CD = 4.   PATH = ileum - mildly active chronic enteritis.  No granulomas.  Polyps - sessile serrated (ascending), adenoma (descending).   - EGD 12/24/2022 - normal esophagus, enlarged gastric folds, mild stenosis and ulceration at the pylorus. Mild erythematous duodenopathy.    PATH = Stomach (body) - gastric fundic gland polyp and PPI effect.  Stomach (antrum) - reactive gastropathy, No H.pylori. Duodenum - normal duodenal mucosa.   - Colonoscopy 12/24/2022 - internal hemorrhoids.  4mm sigmoid polyp (path = lymphoid aggregate).  Otherwise normal colon.  Normal terminal ileum for 30cm and no stenosis at ileocecal valve.    Imaging:    - CT A/P 10/22/13 - normal bowel with no signs of active inflammatino.   - CT-E 03/24/18 - inflammation and thickening of terminal ileum consistent with Crohn's flare. Bilateral renal stones.   - CT-E 11/26/2022 - no acute or active Crohn's colitis, persistent diffuse gastric wall thickening (similar to prior), diffuse thickening of the pylorus and moderate distension of the stomach. Short segment focal wall thickening of the sigmoid for 12cm.     Prior IBD medications (type, dose, duration, response):  []  5-ASAs  x Oral corticosteroids - Prednisone , Entocort  []  Intravenous corticosteroids  []  Antibiotics  []  Thiopurines  x Methotrexate  - started April 2015.  Re-challenge 2019 - no improvement  x Anti-TNF therapies - Humira 2014 - then developed joint pains and (+)ANA so it was stopped.  Remicade  08/17/2020.   x Anti-Interleukin therapies - Stelara  07/2018 - 01/2020 - primary non-response.   x Anti-Integrin therapies - Vedolizumab  02/2020 - Feb 2022 - primary non-response  []  Cyclosporine  []  Clinical trial medication  []  Other (Please specify):    Extraintestinal manifestations:   -joint pains affecting: yes but chronic - suspect osteoarthritis  -eye: n  -skin: n  -oral ulcers :  n  -blood clots: n  -PSC: n  -  other: n          Past Medical History:   Past medical history:   Past Medical History:   Diagnosis Date    AC (acromioclavicular) joint bone spurs     in back    Bulging discs     Crohn's disease    (CMS-HCC)     Degenerative disc disease     GERD (gastroesophageal reflux disease)     Hypertension     Stroke    (CMS-HCC) 2014    topical numbness on right side     Past surgical history:   Past Surgical History:   Procedure Laterality Date    CERVICAL FUSION  2016    KNEE ARTHROSCOPY Right     PR COLONOSCOPY W/BIOPSY SINGLE/MULTIPLE N/A 04/12/2018    Procedure: COLONOSCOPY, FLEXIBLE, PROXIMAL TO SPLENIC FLEXURE; WITH BIOPSY, SINGLE OR MULTIPLE;  Surgeon: Rodger Phlegm, MD;  Location: [facility];  Service: Gastroenterology    PR COLONOSCOPY W/BIOPSY SINGLE/MULTIPLE Left 02/13/2020    Procedure: COLONOSCOPY, FLEXIBLE, PROXIMAL TO SPLENIC FLEXURE; WITH BIOPSY, SINGLE OR MULTIPLE;  Surgeon: Rodger Phlegm, MD;  Location: [facility];  Service: Gastroenterology    PR COLSC FLX W/RMVL OF TUMOR POLYP LESION SNARE TQ N/A 10/14/2013    Procedure: COLONOSCOPY FLEX; W/REMOV TUMOR/LES BY SNARE;  Surgeon: Dorn JINNY Lauth, MD;  Location: [facility];  Service: Gastroenterology    PR COLSC FLX W/RMVL OF TUMOR POLYP LESION SNARE TQ Left 02/13/2020    Procedure: COLONOSCOPY FLEX; W/REMOV TUMOR/LES BY SNARE;  Surgeon: Rodger Phlegm, MD;  Location: [facility];  Service: Gastroenterology    PR COLSC FLX W/RMVL OF TUMOR POLYP LESION SNARE TQ Left 12/24/2022    Procedure: COLONOSCOPY FLEX; W/REMOV TUMOR/LES BY SNARE;  Surgeon: Darra Gerlene Redbird, MD;  Location: [facility];  Service: Gastroenterology    PR UPPER GI ENDOSCOPY,BIOPSY Left 02/13/2020    Procedure: UGI ENDOSCOPY; WITH BIOPSY, SINGLE OR MULTIPLE;  Surgeon: Rodger Phlegm, MD;  Location: [facility];  Service: Gastroenterology    PR UPPER GI ENDOSCOPY,BIOPSY Left 12/24/2022    Procedure: UGI ENDOSCOPY; WITH BIOPSY, SINGLE OR MULTIPLE;  Surgeon: Darra Gerlene Redbird, MD;  Location: [facility];  Service: Gastroenterology     Family history:   Family History   Problem Relation Age of Onset    Cancer Mother         breast    Crohn's disease Neg Hx     Ulcerative colitis Neg Hx     Colorectal Cancer Neg Hx      Social history:   Social History     Socioeconomic History    Marital status: Married     Spouse name: None    Number of children: None    Years of education: None    Highest education level: None   Tobacco Use    Smoking status: Every Day     Current packs/day: 0.25     Average packs/day: 0.3 packs/day for 14.9 years (3.7 ttl pk-yrs)     Types: Cigarettes     Start date: 06/2009    Smokeless tobacco: Never   Vaping Use    Vaping status: Never Used   Substance and Sexual Activity    Alcohol use: No     Comment: heavy drinker in the past, quit 10 years     Drug use: No     Social Drivers of Health  Tobacco Use: High Risk (05/31/2024)    Patient History     Smoking Tobacco Use: Every Day     Smokeless Tobacco Use: Never   Interpersonal Safety: Not At Risk (05/31/2024)    Interpersonal Safety     Unsafe Where You Currently Live: No     Physically Hurt by Anyone: No     Abused by Anyone: No           Allergies:     Allergies   Allergen Reactions    Potassium Clavulanate      Other reaction(s): rash             Medications:     Current Outpatient Medications   Medication Sig Dispense Refill    aspirin  325 MG tablet Take 1 tablet (325 mg total) by mouth daily.      baclofen (LIORESAL) 10 MG tablet       BACLOFEN ORAL Take 5 mg by mouth nightly.      budesonide  (ENTOCORT EC ) 3 mg 24 hr capsule Take 3 capsules (9 mg total) by mouth every morning. 90 capsule 1    cefdinir (OMNICEF) 300 MG capsule Take 1 capsule (300 mg total) by mouth two (2) times a day. (Patient not taking: Reported on 11/10/2023)      cholestyramine  (QUESTRAN ) 4 gram packet Take 1 packet by mouth two (2) times a day. Please avoid taking around the same time as other medications 60 each 2    esomeprazole  (NEXIUM ) 40 MG capsule Take 1 capsule (40 mg total) by mouth daily before breakfast. 90 capsule 1    famotidine  (PEPCID ) 20 MG tablet Take 1 tablet (20 mg total) by mouth at bedtime. 30 tablet 5    gabapentin (NEURONTIN) 300 MG capsule Take 1 capsule (300 mg total) by mouth Three (3) times a day.      HYDROcodone-acetaminophen (NORCO) 7.5-325 mg per tablet Take 3 tablets by mouth in the morning.      infliximab  (REMICADE  IV) Remicade       lisinopril (PRINIVIL,ZESTRIL) 20 MG tablet Take 0.5 tablets (10 mg total) by mouth every other day. prn      rosuvastatin (CRESTOR) 20 MG tablet Take 1 tablet (20 mg total) by mouth in the morning.      tamsulosin (FLOMAX) 0.4 mg capsule        No current facility-administered medications for this visit.           Physical Exam:   BP 139/80 (BP Position: Sitting)  - Pulse 82  - Temp 36.6 ??C (97.8 ??F) (Temporal)  - Ht 175.3 cm (5' 9)  - Wt 76.3 kg (168 lb 3.2 oz)  - BMI 24.84 kg/m??     GEN: no apparent distress, appears comfortable on exam  HEENT: OP clear with no erythema, lesions, exudate, mucous membranes moist  NECK: Supple, no lymphadenopathy  LUNGS: CTAB, no wheezes, rales, or rhonchi  CV: S1/S2, RRR, no murmurs  ABD: Soft, nontender, no rebound/guarding, nondistended, normoactive bowel sounds, no appreciable organomegaly  Extremities: no cyanosis, clubbing or edema, normal gait  Psych: affect appropriate, A&O x3  SKIN: no visible lesions on face, neck, arms, abdomen          Labs, Data & Indices:     Lab Review:   Lab Results   Component Value Date    WBC 6.3 04/26/2024    WBC 6.4 08/29/2014    RBC 4.32 04/26/2024    RBC 4.25 (L) 08/29/2014  HGB 13.8 04/26/2024    HGB 14.2 08/29/2014     Lab Results   Component Value Date    AST 30 03/01/2024    AST 18 (L) 08/29/2014    ALT 17 04/26/2024    ALT 27 08/29/2014    BUN 14 11/10/2023    BUN 19 10/04/2013    Creatinine Whole Blood, POC 1.1 02/24/2020    Creatinine 0.83 11/10/2023    CO2 24.4 11/10/2023    CO2 25 10/04/2013    Albumin 4.1 11/10/2023    Albumin 4.3 08/29/2014    Calcium 9.6 11/10/2023    Calcium 9.8 10/04/2013     Lab Results   Component Value Date    TSH 1.52 10/04/2013       Mitchell Acton Index for Crohn's Disease   General Well Being: 2 = Poor  Abdominal Pain:  0 = none  Number of Liquid Stools Per day: 8  Abdominal Mass:  0 = None  Number of Extraintestinal Manifestations:  0   1 point each for: 1. Arthritis/arthralgias, 2. Iritis/uveitis, 3. Active perianal dz, 4. Other active fistula, 5. Erythema nodosum or pyoderma, 6. Other    Total HBI Score (0-25):  10     Score:  Remission < 5;  Mild dz 5-7;  Moderate dz 8-16;  Severe > 16  ............................................................................................................................................  ORDERS THIS VISIT:       Diagnosis ICD-10-CM Associated Orders   1. Crohn's disease of small intestine without complication (CMS-HCC)  K50.00 CBC     Comprehensive Metabolic Panel     Ferritin     C-reactive protein     Sedimentation Rate     Iron & TIBC     Vitamin B12 Level     Infliximab /Inflx Antibody     Colonoscopy     PCV-21 Capvaxive Infliximab /Inflx Antibody     Vitamin B12 Level     Iron & TIBC     Sedimentation Rate     C-reactive protein     Ferritin     Comprehensive Metabolic Panel     CBC      2. Gastroesophageal reflux disease without esophagitis  K21.9       3. High risk medication use  Z79.899       4. Encounter for immunization  Z23 PCV-21 Capvaxive              Assessment & Recommendations:   Disease state:  Active Crohn's flare    Raymond Wright is a 68 y.o. male with a hx of ileal Crohn's disease dating back to ~2011.  He was previously on humira but stopped due to worsening joint pains and (+)ANA. He was on MTX from ~2015 - 2017, but he self discontinued this medication since he was feeling well.  He developed inflammatory ileal Crohn's flare in fall 2019. Since then, he has had a poor response to Entocort + methotrexate , Stelara  2020-2021 and worsening symptoms on Vedolizumab . We started Remicade  (w/MTX) in Feb 2022 and he has had a good response overall with clinical + endoscopic remission for several years.  However, for past 2-3 months, he has had notable worsening of symptoms suggestive of Crohn's flare. He is having some worsening nausea lately, which is I think is being driven by his inflammation. We will restage his disease with a colonoscopy and plan to do a remicade  level to assess the reason for potential Crohn's flare (pharmacokinetic vs. Immunogenic vs. mechanistic). Depending on results of the scope, labs and  remicade  level, we will decide on next steps in treatment.  We had a brief discussion today about IL-23 inhibitors and Rinvoq.     PLAN:   1. We will check labs today   2. Colonoscopy - next available to stage ileal disease.  3. I will send in Budesonide , you can start 3 capsules daily (9mg )  4. We will check a remicade  level with your next dose to guide future medication options. Some options we discussed today include: Skyrizi, Tremfya, and Rinvoq.   5. Pneumonia vaccine booster today    IBD health maintenance:  Influenza vaccine: 05/12/2023  Pneumonia vaccine:  Prevnar 08/10/15, Pneumovax ~2012, Pneumovax 03/12/18  COVID19 Vaccine:  2 shots spring 2021, recommend booster  Hepatitis B: sAg neg 03/12/18  TB testing: Quantiferon neg 03/12/18  Chickenpox/Shingles history:  Had chickenpox, Shingrix  today 2/2 today  Bone denistometry:   Derm appointment:  Last colonoscopy:  2021  --------------------------------------------  I personally spent 52 minutes face-to-face and non-face-to-face in the care of this patient, which includes all pre, intra, and post visit time on the date of service.    Author: Rodger Phlegm, MD 05/31/2024 10:32 AM    Rodger Phlegm, MD  Associate Professor of Medicine  Division of Gastroenterology & Hepatology  [employer]  [facility]  ============================================

## 2024-05-31 NOTE — Patient Instructions (Addendum)
 Raymond Wright it was a pleasure seeing you today.  Here is a summary from today's visit:     1. We will check labs today   2.  Please call [phone] to schedule your colonoscopy as soon as possible  3.  I will send in Budesonide , you can start 3 capsules daily (9mg )  4.  We will check a remicade  level with your next dose to guide future medication options.   5.  Some options we discussed today include:  Skyrizi (Risankizumab), Tremfya (Guselkumab) or Rinvoq (Upadacitinib).  I think rinvoq could be a very good option for you but there is some immune suppression and other potential risks to be aware of (we discussed blood clots and cardiac events / heart attacks today).  6.  If any trouble or symptoms, do not hesitate to call.     Rodger Phlegm, MD  Associate Professor of Medicine  Division of Gastroenterology & Hepatology  [employer]  [facility]            EXPECTATIONS FOR PATIENT FOLLOW UP AND COMMUNICATION:  -- Follow up Appointments:  Crohn's disease and Ulcerative colitis are serious chronic inflammatory diseases which require close monitoring.  I expect to see patients for a follow up visits at least every 6 months (or more often if you are having a flare, starting new therapy, etc).  In select cases, patients who are only on aminosalicylates / mesalamine, may be seen once per year for follow up.  If you are not able to come for follow up appointments we may not be able to refill your medications or continue caring for you.   -- Appointment Type: While we are continuing to offer video appointments in select cases (stable patients who are not in a flare and without new/active symptoms) during the COVID19 pandemic, I expect to see patients in person at least once per year.   -- Communication:  if you have any questions or concerns, you can communicate with us  via phone (contact information below) or via myChart.  Note that phone messages are given higher priority and triaged first. For urgent issues, please contact us  via phone.      IBD NURSE COORDINATOR CONTACT - Duwaine Legions, RN     Phone: [phone] (direct line)      Fax: [phone]  * For urgent medical concerns after hours or on weekends and holidays, call [phone] and ask to speak to the GI Fellow on call.    * If you have a GI medical question or GI symptoms and would like to speak to your provider's healthcare team, please contact Duwaine Legions, RN (contact information above) OR you can send the GI healthcare team a message through MyChart at [email or web address]    [facility] MESSAGES  -- MyChart messages and advice requests are now going to be sent to our clinical care team. A team member will respond to the message or send it to your provider if needed.   -- The message will typically be addressed within 3-4 business days if it is an issue that can be handled by MyChart.   -- For multiple questions, complex concerns, and/or if you have not been by seen by your provider in the recommended follow-up period you may be asked to schedule a clinic visit.   -- MyChart messages are NOT read after hours or on weekends. MyChart is NOT meant for urgent issues or emergencies. For emergencies call 911 or go to the nearest emergency  department.     APPOINTMENT SCHEDULING FOR GI CLINIC AND GI PROCEDURES:  RADIOLOGY - to schedule imaging ordered, please call [phone]  GI PROCEDURES         [phone] option 2   GI MEDICINE CLINIC  [phone] option 1   *To schedule, reschedule, or cancel your GI appointment, please call [phone]. If you are unable to come to an appointment, please notify us  as soon as possible, preferably 24 hours in advance. Doing so may allow other patients with urgent needs to be scheduled in a cancelled appointment slot.     TEST RESULTS   If you have a MyChart account, your new results and a provider message will be sent to you through your MyChart account at [email or web address]. For results that require follow-up, a member of your healthcare team will also contact you.    PRESCRIPTION REFILL REQUESTS  To request prescription refills, please contact your pharmacy or send your healthcare team a message through your MyChart account at [email or web address]  RECORD REQUESTS  For questions related to medical records, please call Medical Records Release of Information at [phone]  FINANCIAL NAVIGATOR   For billing and other financial questions/needs - please call [phone]. If you need to leave a message, please be sure to leave your full name, date of birth or Medical record number, best call back # and reason for call.    For educational material and resources:  [email or web address]    ================================================================

## 2024-05-31 NOTE — Progress Notes (Signed)
 CORRECTED G3  Colonoscopy  Procedure #1     Procedure #2   [id number]  MRN   JAIN/IBD  Endoscopist     Is the patient's health insurance [employer], [employer], United Healthcare (UHC), [employer] [facility] [place], [employer], or [employer]?     Urgent procedure     Are you pregnant? (Ignore if [facility] GI provider has OK'd procedure in order comments despite pregnancy)     Do you have chest pain with physical activity or are you in the process of scheduling or awaiting results of a heart ultrasound, stress test, or catheterization to evaluate chest pain, dizziness, or shortness of breath?     Do you have achalasia or gastroparesis or take Mounjaro, Zepbound, [place], Trulicity, Ozempic, Victoza, Saxenda, Byetta, Bydureon, Rybelsus, or Adlyxin?      Do you take: Plavix (clopidogrel), Coumadin (warfarin), Lovenox (enoxaparin), Pradaxa (dabigatran), Effient (prasugrel), Xarelto (rivaroxaban), Eliquis (apixaban), Pletal (cilostazol), or Brilinta (ticagrelor)?          Did ordering provider indicate how long to hold this medication in the order comments?          Which of the above medications are you taking?          What is the name of the medical practice that manages this medication?          What is the name of the medical provider who manages this medication?     Do you have hemophilia, von Willebrand disease, or low platelets?     Do you have a pacemaker or implanted cardiac defibrillator?     Has a  GI provider specified the location(s)?     Which location(s) did the [facility] provider specify?          [facility]          [place]          [facility]      Do you see a liver specialist for chronic liver disease?     Is the procedure indication for variceal screening?     Is procedure indication for variceal banding (this does NOT include variceal screening)?     Have you had a heart attack, stroke or heart stent placement within the past 6 months?     Month of event     Year of event (ONLY ENTER LAST 2 DIGITS)        5  Height (feet)   9  Height (inches)   168  Weight (pounds)   24.8  BMI          Did the ordering provider specify a bowel prep?          What bowel prep was specified?     Do you have an ostomy (bag on your stomach that collects your stool)?          Is it an ileostomy?          Is it a colostomy?          Patient doesn't know.     Do you have chronic kidney disease?     Do you have chronic constipation or have you had poor quality bowel preps for past colonoscopies?   TRUE  Do you have Crohn's disease or ulcerative colitis?     Have you had weight loss surgery?          Do you ever use supplemental oxygen ?     Have you been hospitalized for cirrhosis of the liver or heart  failure in the last 12 months?     Have you been treated for mouth or throat cancer with radiation or surgery?     Have you been told that it is difficult for doctors to insert a breathing tube in you during anesthesia?     Have you had, or are you being considered for, a heart or lung transplant?          Are you on dialysis?     Have you started dialysis in the last 6 months?     Do you have cirrhosis of the liver?     Do you have myasthenia gravis?     Is the patient a prisoner?          Do you have obstructive sleep apnea?   TRUE  Have you previously received propofol  sedation by an anesthesiologist for a GI procedure?     Do you have more than 3 drinks of alcohol per day on average?     Do you regularly take prescription medications for chronic pain?     Do you regularly take Ativan, Klonopin, Xanax, Valium, lorazepam, clonazepam, alprazolam, or diazepam?     Have you previously had difficulty with sedation during a GI procedure?     Are you allergic to fentanyl , midazolam, or Versed?     Do you take medications for HIV?   ################# ## ###################################################################################################################   MRN:  [id number]   Anticoag Review  No   Nurse Triage  No   Procedure(s):  Colonoscopy 0   Endoscopist:  JAIN/IBD   Urgent:  No   Prep:  Miralax Prep                            --------------------------- --- ----------------------------------------------------------------------------------------------------------------------------------------------------------------------------   [facility] Locations:  [facility]     [facility]      [place]             Requested Locations:              ################# ## ###################################################################################################################   CORRECTED G3

## 2024-05-31 NOTE — Progress Notes (Signed)
 Colonoscopy  Procedure #1     Procedure #2   [id number]  MRN   JAIN/IBD  Endoscopist     Is the patient's health insurance [employer], [employer], United Healthcare [facility]), [employer] [facility] [place], [employer], or [employer]?   TRUE  Urgent procedure     Are you pregnant? (Ignore if [facility] GI provider has OK'd procedure in order comments despite pregnancy)     Do you have chest pain with physical activity or are you in the process of scheduling or awaiting results of a heart ultrasound, stress test, or catheterization to evaluate chest pain, dizziness, or shortness of breath?     Do you have achalasia or gastroparesis or take Mounjaro, Zepbound, [place], Trulicity, Ozempic, Victoza, Saxenda, Byetta, Bydureon, Rybelsus, or Adlyxin?      Do you take: Plavix (clopidogrel), Coumadin (warfarin), Lovenox (enoxaparin), Pradaxa (dabigatran), Effient (prasugrel), Xarelto (rivaroxaban), Eliquis (apixaban), Pletal (cilostazol), or Brilinta (ticagrelor)?          Did ordering provider indicate how long to hold this medication in the order comments?          Which of the above medications are you taking?          What is the name of the medical practice that manages this medication?          What is the name of the medical provider who manages this medication?     Do you have hemophilia, von Willebrand disease, or low platelets?     Do you have a pacemaker or implanted cardiac defibrillator?     Has a [place] GI provider specified the location(s)?     Which location(s) did the [facility] provider specify?          [facility]          [place]          [facility]      Do you see a liver specialist for chronic liver disease?     Is the procedure indication for variceal screening?     Is procedure indication for variceal banding (this does NOT include variceal screening)?     Have you had a heart attack, stroke or heart stent placement within the past 6 months?     Month of event     Year of event (ONLY ENTER LAST 2 DIGITS)        5  Height (feet)   9 Height (inches)   168  Weight (pounds)   24.8  BMI          Did the ordering provider specify a bowel prep?          What bowel prep was specified?     Do you have an ostomy (bag on your stomach that collects your stool)?          Is it an ileostomy?          Is it a colostomy?          Patient doesn't know.     Do you have chronic kidney disease?     Do you have chronic constipation or have you had poor quality bowel preps for past colonoscopies?     Do you have Crohn's disease or ulcerative colitis?     Have you had weight loss surgery?          Do you ever use supplemental oxygen ?     Have you been hospitalized for cirrhosis of the liver or heart failure in the last  12 months?     Have you been treated for mouth or throat cancer with radiation or surgery?     Have you been told that it is difficult for doctors to insert a breathing tube in you during anesthesia?     Have you had, or are you being considered for, a heart or lung transplant?          Are you on dialysis?     Have you started dialysis in the last 6 months?     Do you have cirrhosis of the liver?     Do you have myasthenia gravis?     Is the patient a prisoner?          Do you have obstructive sleep apnea?   TRUE  Have you previously received propofol  sedation by an anesthesiologist for a GI procedure?     Do you have more than 3 drinks of alcohol per day on average?     Do you regularly take prescription medications for chronic pain?     Do you regularly take Ativan, Klonopin, Xanax, Valium, lorazepam, clonazepam, alprazolam, or diazepam?     Have you previously had difficulty with sedation during a GI procedure?     Are you allergic to fentanyl , midazolam, or Versed?     Do you take medications for HIV?   ################# ## ###################################################################################################################   MRN:  [id number]   Anticoag Review  No   Nurse Triage  No   Procedure(s):  Colonoscopy     0 Endoscopist:  JAIN/IBD   Urgent:  Yes   Prep:  [place] Prep                            --------------------------- --- ----------------------------------------------------------------------------------------------------------------------------------------------------------------------------   [facility] Locations:  [facility]     [facility]      [place]             Requested Locations:              ################# ## ###################################################################################################################

## 2024-06-07 DIAGNOSIS — K50914 Crohn's disease, unspecified, with abscess: Principal | ICD-10-CM

## 2024-06-10 NOTE — Telephone Encounter (Signed)
 Attempted to call patient regarding upcoming GI procedure on 06/13/24. No answer. LVM for patient to call nurse line with any questions.

## 2024-06-13 ENCOUNTER — Encounter: Admit: 2024-06-13 | Discharge: 2024-06-13 | Payer: MEDICARE

## 2024-06-13 ENCOUNTER — Inpatient Hospital Stay: Admit: 2024-06-13 | Discharge: 2024-06-13 | Payer: MEDICARE

## 2024-06-13 MED ADMIN — sodium chloride (NS) 0.9 % infusion: 10 mL/h | INTRAVENOUS | @ 13:00:00 | Stop: 2024-06-13

## 2024-06-13 MED ADMIN — Propofol (DIPRIVAN) injection: INTRAVENOUS | @ 14:00:00 | Stop: 2024-06-13

## 2024-06-13 MED ADMIN — lidocaine (PF) (XYLOCAINE-MPF) 20 mg/mL (2 %) injection: INTRAVENOUS | @ 14:00:00 | Stop: 2024-06-13

## 2024-06-13 MED ADMIN — propofol (DIPRIVAN) infusion 10 mg/mL: INTRAVENOUS | @ 14:00:00 | Stop: 2024-06-13

## 2024-06-21 ENCOUNTER — Encounter: Admit: 2024-06-21 | Discharge: 2024-06-22 | Payer: MEDICARE

## 2024-06-21 LAB — ALT: ALT (SGPT): 22 U/L (ref 10–49)

## 2024-06-21 LAB — CBC W/ AUTO DIFF
BASOPHILS ABSOLUTE COUNT: 0.1 10*9/L (ref 0.0–0.1)
BASOPHILS RELATIVE PERCENT: 1.4 %
EOSINOPHILS ABSOLUTE COUNT: 0.5 10*9/L (ref 0.0–0.5)
EOSINOPHILS RELATIVE PERCENT: 7.2 %
HEMATOCRIT: 39.4 % (ref 39.0–48.0)
HEMOGLOBIN: 13.6 g/dL (ref 12.9–16.5)
LYMPHOCYTES ABSOLUTE COUNT: 2.1 10*9/L (ref 1.1–3.6)
LYMPHOCYTES RELATIVE PERCENT: 28 %
MEAN CORPUSCULAR HEMOGLOBIN CONC: 34.4 g/dL (ref 32.0–36.0)
MEAN CORPUSCULAR HEMOGLOBIN: 33.1 pg — ABNORMAL HIGH (ref 25.9–32.4)
MEAN CORPUSCULAR VOLUME: 96.2 fL — ABNORMAL HIGH (ref 77.6–95.7)
MEAN PLATELET VOLUME: 7.4 fL (ref 6.8–10.7)
MONOCYTES ABSOLUTE COUNT: 0.5 10*9/L (ref 0.3–0.8)
MONOCYTES RELATIVE PERCENT: 7.1 %
NEUTROPHILS ABSOLUTE COUNT: 4.3 10*9/L (ref 1.8–7.8)
NEUTROPHILS RELATIVE PERCENT: 56.3 %
PLATELET COUNT: 235 10*9/L (ref 150–450)
RED BLOOD CELL COUNT: 4.09 10*12/L — ABNORMAL LOW (ref 4.26–5.60)
RED CELL DISTRIBUTION WIDTH: 13.4 % (ref 12.2–15.2)
WBC ADJUSTED: 7.6 10*9/L (ref 3.6–11.2)

## 2024-06-21 LAB — C-REACTIVE PROTEIN: C-REACTIVE PROTEIN: <5 mg/L (ref <=10.0)

## 2024-06-21 LAB — AST: AST (SGOT): 23 U/L (ref <=34)

## 2024-06-21 MED ADMIN — inFLIXimab-axxq (AVSOLA) 5 mg/kg = 400 mg in sodium chloride (NS) 250 mL rapid infusion: 5 mg/kg | INTRAVENOUS | @ 15:00:00 | Stop: 2024-06-21

## 2024-06-21 NOTE — Progress Notes (Signed)
 Patient presents for accelerated Avsola  (infliximab -axxq) infusion.  In no acute distress.  Vitals stable.  Reports no new medical issues or S/S of infection.  IV started to left posterior foream, labs drawn per orders. Patient declines premeds.  Future appointment scheduled x 1.    1025 Avsola  (infliximab -axxq) 400 mg started to infuse as follows:     100 ml/hr x 15 min then 300 ml/hr for remainder of infusion.    1129Avsola (infliximab -axxq) infusion complete.  PIV flushed with NS.  Vitals stable.  Patient without any s/s of adverse reaction.  IV d/c'd.  Patient discharged from [facility]fusion [facility].

## 2024-07-21 MED ORDER — BUDESONIDE DR - ER 3 MG CAPSULE,DELAYED,EXTENDED RELEASE
ORAL_CAPSULE | ORAL | 0 refills | 28.00000 days | Status: CP
Start: 2024-07-21 — End: 2024-08-18
